# Patient Record
Sex: Male | Born: 1950 | Race: White | Hispanic: No | Marital: Married | Smoking: Former smoker
Health system: Southern US, Community
[De-identification: ages and names within clinical notes are randomized; demographics above are authoritative.]

## PROBLEM LIST (undated history)

## (undated) DIAGNOSIS — F418 Other specified anxiety disorders: Secondary | ICD-10-CM

## (undated) DIAGNOSIS — I480 Paroxysmal atrial fibrillation: Secondary | ICD-10-CM

## (undated) DIAGNOSIS — G4730 Sleep apnea, unspecified: Secondary | ICD-10-CM

## (undated) DIAGNOSIS — F419 Anxiety disorder, unspecified: Secondary | ICD-10-CM

## (undated) DIAGNOSIS — M1990 Unspecified osteoarthritis, unspecified site: Secondary | ICD-10-CM

## (undated) DIAGNOSIS — K219 Gastro-esophageal reflux disease without esophagitis: Secondary | ICD-10-CM

## (undated) DIAGNOSIS — M5090 Cervical disc disorder, unspecified, unspecified cervical region: Secondary | ICD-10-CM

## (undated) DIAGNOSIS — I10 Essential (primary) hypertension: Secondary | ICD-10-CM

## (undated) DIAGNOSIS — J449 Chronic obstructive pulmonary disease, unspecified: Secondary | ICD-10-CM

## (undated) HISTORY — PX: WRIST SURGERY: SHX841

## (undated) HISTORY — DX: Cervical disc disorder, unspecified, unspecified cervical region: M50.90

## (undated) HISTORY — PX: TOTAL SHOULDER ARTHROPLASTY: SHX126

## (undated) HISTORY — DX: Other specified anxiety disorders: F41.8

## (undated) HISTORY — DX: Essential (primary) hypertension: I10

## (undated) HISTORY — PX: APPENDECTOMY: SHX54

## (undated) HISTORY — PX: CHOLECYSTECTOMY: SHX55

## (undated) HISTORY — DX: Unspecified osteoarthritis, unspecified site: M19.90

## (undated) HISTORY — DX: Gastro-esophageal reflux disease without esophagitis: K21.9

## (undated) HISTORY — DX: Anxiety disorder, unspecified: F41.9

## (undated) HISTORY — DX: Chronic obstructive pulmonary disease, unspecified: J44.9

## (undated) HISTORY — DX: Paroxysmal atrial fibrillation: I48.0

## (undated) HISTORY — PX: OTHER SURGICAL HISTORY: SHX169

---

## 1998-11-18 ENCOUNTER — Ambulatory Visit (HOSPITAL_COMMUNITY): Admission: RE | Admit: 1998-11-18 | Discharge: 1998-11-18 | Payer: Self-pay | Admitting: Internal Medicine

## 1998-11-18 ENCOUNTER — Encounter: Payer: Self-pay | Admitting: Internal Medicine

## 1998-12-02 ENCOUNTER — Ambulatory Visit (HOSPITAL_COMMUNITY): Admission: RE | Admit: 1998-12-02 | Discharge: 1998-12-02 | Payer: Self-pay | Admitting: Internal Medicine

## 1998-12-31 ENCOUNTER — Encounter: Payer: Self-pay | Admitting: Internal Medicine

## 1998-12-31 ENCOUNTER — Ambulatory Visit (HOSPITAL_COMMUNITY): Admission: RE | Admit: 1998-12-31 | Discharge: 1998-12-31 | Payer: Self-pay | Admitting: Internal Medicine

## 1999-01-14 ENCOUNTER — Encounter: Payer: Self-pay | Admitting: Internal Medicine

## 1999-01-14 ENCOUNTER — Ambulatory Visit (HOSPITAL_COMMUNITY): Admission: RE | Admit: 1999-01-14 | Discharge: 1999-01-14 | Payer: Self-pay | Admitting: Internal Medicine

## 1999-03-10 ENCOUNTER — Ambulatory Visit (HOSPITAL_COMMUNITY): Admission: RE | Admit: 1999-03-10 | Discharge: 1999-03-10 | Payer: Self-pay | Admitting: Neurosurgery

## 1999-03-10 ENCOUNTER — Encounter: Payer: Self-pay | Admitting: Neurosurgery

## 2000-08-06 ENCOUNTER — Ambulatory Visit (HOSPITAL_COMMUNITY): Admission: RE | Admit: 2000-08-06 | Discharge: 2000-08-06 | Payer: Self-pay | Admitting: Internal Medicine

## 2000-08-07 ENCOUNTER — Encounter: Payer: Self-pay | Admitting: Internal Medicine

## 2000-09-18 ENCOUNTER — Ambulatory Visit (HOSPITAL_COMMUNITY): Admission: RE | Admit: 2000-09-18 | Discharge: 2000-09-18 | Payer: Self-pay | Admitting: Internal Medicine

## 2000-09-18 ENCOUNTER — Encounter: Payer: Self-pay | Admitting: Internal Medicine

## 2001-01-03 ENCOUNTER — Encounter: Payer: Self-pay | Admitting: Internal Medicine

## 2001-01-03 ENCOUNTER — Ambulatory Visit (HOSPITAL_COMMUNITY): Admission: RE | Admit: 2001-01-03 | Discharge: 2001-01-03 | Payer: Self-pay | Admitting: Internal Medicine

## 2001-02-18 ENCOUNTER — Encounter: Payer: Self-pay | Admitting: Neurosurgery

## 2001-02-18 ENCOUNTER — Encounter: Admission: RE | Admit: 2001-02-18 | Discharge: 2001-02-18 | Payer: Self-pay | Admitting: Neurosurgery

## 2002-03-18 ENCOUNTER — Encounter: Payer: Self-pay | Admitting: Internal Medicine

## 2002-03-18 ENCOUNTER — Ambulatory Visit (HOSPITAL_COMMUNITY): Admission: RE | Admit: 2002-03-18 | Discharge: 2002-03-18 | Payer: Self-pay | Admitting: Internal Medicine

## 2002-08-04 ENCOUNTER — Encounter: Payer: Self-pay | Admitting: Internal Medicine

## 2002-08-04 ENCOUNTER — Ambulatory Visit (HOSPITAL_COMMUNITY): Admission: RE | Admit: 2002-08-04 | Discharge: 2002-08-04 | Payer: Self-pay | Admitting: Internal Medicine

## 2003-10-07 ENCOUNTER — Ambulatory Visit (HOSPITAL_COMMUNITY): Admission: RE | Admit: 2003-10-07 | Discharge: 2003-10-07 | Payer: Self-pay | Admitting: Internal Medicine

## 2003-10-07 HISTORY — PX: COLONOSCOPY: SHX174

## 2004-05-27 ENCOUNTER — Emergency Department (HOSPITAL_COMMUNITY): Admission: EM | Admit: 2004-05-27 | Discharge: 2004-05-28 | Payer: Self-pay | Admitting: *Deleted

## 2004-06-02 ENCOUNTER — Ambulatory Visit (HOSPITAL_COMMUNITY): Admission: RE | Admit: 2004-06-02 | Discharge: 2004-06-02 | Payer: Self-pay | Admitting: Internal Medicine

## 2004-10-18 ENCOUNTER — Encounter (HOSPITAL_COMMUNITY): Admission: RE | Admit: 2004-10-18 | Discharge: 2004-10-21 | Payer: Self-pay | Admitting: Orthopaedic Surgery

## 2004-10-24 ENCOUNTER — Encounter (HOSPITAL_COMMUNITY): Admission: RE | Admit: 2004-10-24 | Discharge: 2004-11-23 | Payer: Self-pay | Admitting: Orthopaedic Surgery

## 2004-11-25 ENCOUNTER — Encounter (HOSPITAL_COMMUNITY): Admission: RE | Admit: 2004-11-25 | Discharge: 2004-12-25 | Payer: Self-pay | Admitting: Orthopaedic Surgery

## 2004-12-27 ENCOUNTER — Encounter (HOSPITAL_COMMUNITY): Admission: RE | Admit: 2004-12-27 | Discharge: 2005-01-03 | Payer: Self-pay | Admitting: Orthopaedic Surgery

## 2005-08-25 ENCOUNTER — Ambulatory Visit (HOSPITAL_COMMUNITY): Admission: RE | Admit: 2005-08-25 | Discharge: 2005-08-25 | Payer: Self-pay | Admitting: Pulmonary Disease

## 2006-04-03 ENCOUNTER — Ambulatory Visit (HOSPITAL_COMMUNITY): Admission: RE | Admit: 2006-04-03 | Discharge: 2006-04-03 | Payer: Self-pay | Admitting: Internal Medicine

## 2007-02-19 ENCOUNTER — Inpatient Hospital Stay (HOSPITAL_COMMUNITY): Admission: RE | Admit: 2007-02-19 | Discharge: 2007-02-21 | Payer: Self-pay | Admitting: Orthopaedic Surgery

## 2007-03-05 ENCOUNTER — Encounter (HOSPITAL_COMMUNITY): Admission: RE | Admit: 2007-03-05 | Discharge: 2007-04-04 | Payer: Self-pay | Admitting: Orthopaedic Surgery

## 2007-04-08 ENCOUNTER — Encounter (HOSPITAL_COMMUNITY): Admission: RE | Admit: 2007-04-08 | Discharge: 2007-05-08 | Payer: Self-pay | Admitting: Orthopaedic Surgery

## 2007-07-11 ENCOUNTER — Inpatient Hospital Stay (HOSPITAL_COMMUNITY): Admission: EM | Admit: 2007-07-11 | Discharge: 2007-07-15 | Payer: Self-pay | Admitting: Emergency Medicine

## 2007-07-11 ENCOUNTER — Encounter (INDEPENDENT_AMBULATORY_CARE_PROVIDER_SITE_OTHER): Payer: Self-pay | Admitting: Internal Medicine

## 2007-07-11 ENCOUNTER — Ambulatory Visit: Payer: Self-pay | Admitting: Internal Medicine

## 2007-07-12 ENCOUNTER — Encounter (INDEPENDENT_AMBULATORY_CARE_PROVIDER_SITE_OTHER): Payer: Self-pay | Admitting: Internal Medicine

## 2007-07-17 ENCOUNTER — Ambulatory Visit: Payer: Self-pay | Admitting: Cardiology

## 2007-07-19 ENCOUNTER — Ambulatory Visit: Payer: Self-pay | Admitting: Cardiology

## 2007-07-24 ENCOUNTER — Ambulatory Visit: Payer: Self-pay | Admitting: Cardiovascular Disease

## 2007-07-31 ENCOUNTER — Ambulatory Visit: Payer: Self-pay | Admitting: Cardiology

## 2007-07-31 ENCOUNTER — Encounter (HOSPITAL_COMMUNITY): Admission: RE | Admit: 2007-07-31 | Discharge: 2007-08-30 | Payer: Self-pay | Admitting: Cardiovascular Disease

## 2007-08-07 ENCOUNTER — Ambulatory Visit: Payer: Self-pay | Admitting: Cardiology

## 2007-08-14 ENCOUNTER — Ambulatory Visit: Payer: Self-pay | Admitting: Cardiovascular Disease

## 2007-08-14 ENCOUNTER — Ambulatory Visit: Payer: Self-pay | Admitting: Cardiology

## 2007-08-28 ENCOUNTER — Ambulatory Visit: Payer: Self-pay | Admitting: Cardiology

## 2007-09-13 ENCOUNTER — Ambulatory Visit: Payer: Self-pay | Admitting: Cardiology

## 2007-10-07 ENCOUNTER — Ambulatory Visit (HOSPITAL_COMMUNITY): Admission: RE | Admit: 2007-10-07 | Discharge: 2007-10-07 | Payer: Self-pay | Admitting: Internal Medicine

## 2007-12-24 DIAGNOSIS — I4891 Unspecified atrial fibrillation: Secondary | ICD-10-CM

## 2007-12-24 DIAGNOSIS — K219 Gastro-esophageal reflux disease without esophagitis: Secondary | ICD-10-CM | POA: Insufficient documentation

## 2007-12-24 DIAGNOSIS — J449 Chronic obstructive pulmonary disease, unspecified: Secondary | ICD-10-CM

## 2007-12-24 DIAGNOSIS — I10 Essential (primary) hypertension: Secondary | ICD-10-CM

## 2007-12-24 DIAGNOSIS — J4489 Other specified chronic obstructive pulmonary disease: Secondary | ICD-10-CM | POA: Insufficient documentation

## 2008-05-13 ENCOUNTER — Ambulatory Visit: Payer: Self-pay | Admitting: Cardiology

## 2008-05-20 ENCOUNTER — Ambulatory Visit: Payer: Self-pay | Admitting: Cardiology

## 2008-06-10 ENCOUNTER — Ambulatory Visit: Payer: Self-pay | Admitting: Cardiology

## 2008-06-19 ENCOUNTER — Ambulatory Visit: Payer: Self-pay | Admitting: Cardiovascular Disease

## 2008-06-19 ENCOUNTER — Encounter: Payer: Self-pay | Admitting: Physician Assistant

## 2008-06-23 ENCOUNTER — Encounter: Payer: Self-pay | Admitting: Cardiology

## 2008-06-23 ENCOUNTER — Ambulatory Visit (HOSPITAL_COMMUNITY): Admission: RE | Admit: 2008-06-23 | Discharge: 2008-06-23 | Payer: Self-pay | Admitting: Cardiology

## 2008-06-25 ENCOUNTER — Ambulatory Visit: Payer: Self-pay | Admitting: Cardiology

## 2008-06-30 ENCOUNTER — Ambulatory Visit: Payer: Self-pay | Admitting: Cardiology

## 2008-07-08 ENCOUNTER — Encounter (INDEPENDENT_AMBULATORY_CARE_PROVIDER_SITE_OTHER): Payer: Self-pay | Admitting: *Deleted

## 2008-07-13 ENCOUNTER — Ambulatory Visit: Payer: Self-pay | Admitting: Cardiology

## 2008-07-17 ENCOUNTER — Telehealth (INDEPENDENT_AMBULATORY_CARE_PROVIDER_SITE_OTHER): Payer: Self-pay | Admitting: *Deleted

## 2008-07-23 ENCOUNTER — Ambulatory Visit: Payer: Self-pay | Admitting: Cardiology

## 2008-07-23 ENCOUNTER — Inpatient Hospital Stay (HOSPITAL_COMMUNITY): Admission: EM | Admit: 2008-07-23 | Discharge: 2008-07-23 | Payer: Self-pay | Admitting: Emergency Medicine

## 2008-07-30 ENCOUNTER — Ambulatory Visit: Payer: Self-pay | Admitting: Cardiology

## 2008-08-07 ENCOUNTER — Encounter (INDEPENDENT_AMBULATORY_CARE_PROVIDER_SITE_OTHER): Payer: Self-pay | Admitting: *Deleted

## 2008-10-14 ENCOUNTER — Ambulatory Visit: Payer: Self-pay | Admitting: Cardiology

## 2008-10-15 ENCOUNTER — Encounter: Payer: Self-pay | Admitting: Cardiology

## 2008-11-03 ENCOUNTER — Encounter: Payer: Self-pay | Admitting: Cardiology

## 2009-02-17 ENCOUNTER — Ambulatory Visit (HOSPITAL_COMMUNITY): Admission: RE | Admit: 2009-02-17 | Discharge: 2009-02-17 | Payer: Self-pay | Admitting: Internal Medicine

## 2009-05-04 ENCOUNTER — Emergency Department (HOSPITAL_COMMUNITY): Admission: EM | Admit: 2009-05-04 | Discharge: 2009-05-04 | Payer: Self-pay | Admitting: Emergency Medicine

## 2009-06-08 ENCOUNTER — Encounter (INDEPENDENT_AMBULATORY_CARE_PROVIDER_SITE_OTHER): Payer: Self-pay | Admitting: *Deleted

## 2009-12-24 ENCOUNTER — Ambulatory Visit (HOSPITAL_COMMUNITY): Admission: RE | Admit: 2009-12-24 | Discharge: 2009-12-24 | Payer: Self-pay | Admitting: Internal Medicine

## 2010-02-13 ENCOUNTER — Encounter: Payer: Self-pay | Admitting: Preventative Medicine

## 2010-02-20 LAB — CONVERTED CEMR LAB
BUN: 15 mg/dL
Bilirubin, Direct: 0.8 mg/dL
CO2: 25 meq/L
Chloride: 104 meq/L
Glucose, Bld: 93 mg/dL
Platelets: 269 10*3/uL
Sodium: 145 meq/L
Total Protein: 7.7 g/dL

## 2010-02-22 NOTE — Letter (Signed)
Summary: Appointment - Reminder 2  [facility] [place]. [address] [place] [address]   Phone: [phone]  Fax: [phone]     Jun 08, 2009 MRN: [id number]   Travis Booker [address] [place], [place]  [address]   Dear [name],  Our records indicate that it is time to schedule a follow-up appointment.  Dr.  Daleen Squibb        recommended that you follow up with [place] in    03/2009        . It is very important that we reach you to schedule this appointment. We look forward to participating in your health care needs. Please contact us at the number listed above at your earliest convenience to schedule your appointment.  If you are unable to make an appointment at this time, give [place] a call so we can update our records.     Sincerely,   Glass blower/designer

## 2010-04-13 LAB — URINALYSIS, ROUTINE W REFLEX MICROSCOPIC
Nitrite: NEGATIVE
Specific Gravity, Urine: >1.03 — ABNORMAL HIGH (ref 1.005–1.030)
Urobilinogen, UA: 0.2 mg/dL (ref 0.0–1.0)
pH: 5 (ref 5.0–8.0)

## 2010-04-13 LAB — URINE MICROSCOPIC-ADD ON

## 2010-04-13 LAB — CBC
HCT: 43.3 % (ref 39.0–52.0)
Hemoglobin: 14.8 g/dL (ref 13.0–17.0)
MCHC: 34.3 g/dL (ref 30.0–36.0)
RDW: 12.9 % (ref 11.5–15.5)

## 2010-04-13 LAB — DIFFERENTIAL
Basophils Absolute: 0 10*3/uL (ref 0.0–0.1)
Basophils Relative: 0 % (ref 0–1)
Eosinophils Absolute: 0.2 10*3/uL (ref 0.0–0.7)
Eosinophils Relative: 2 % (ref 0–5)
Monocytes Absolute: 0.6 10*3/uL (ref 0.1–1.0)

## 2010-04-13 LAB — BASIC METABOLIC PANEL
Calcium: 9.4 mg/dL (ref 8.4–10.5)
Creatinine, Ser: 1.01 mg/dL (ref 0.4–1.5)
GFR calc non Af Amer: >60 mL/min (ref >60)

## 2010-05-01 LAB — DIFFERENTIAL
Basophils Absolute: 0 10*3/uL (ref 0.0–0.1)
Basophils Relative: 1 % (ref 0–1)
Lymphocytes Relative: 37 % (ref 12–46)
Monocytes Absolute: 0.5 10*3/uL (ref 0.1–1.0)
Monocytes Absolute: 0.9 10*3/uL (ref 0.1–1.0)
Monocytes Relative: 6 % (ref 3–12)
Monocytes Relative: 6 % (ref 3–12)
Neutro Abs: 4.6 10*3/uL (ref 1.7–7.7)
Neutro Abs: 7.6 10*3/uL (ref 1.7–7.7)

## 2010-05-01 LAB — BASIC METABOLIC PANEL
CO2: 30 mEq/L (ref 19–32)
Calcium: 9.1 mg/dL (ref 8.4–10.5)
Chloride: 99 mEq/L (ref 96–112)
GFR calc Af Amer: 59 mL/min — ABNORMAL LOW (ref >60)
GFR calc Af Amer: >60 mL/min (ref >60)
GFR calc non Af Amer: >60 mL/min (ref >60)
Potassium: 2.8 mEq/L — ABNORMAL LOW (ref 3.5–5.1)
Potassium: 3.4 mEq/L — ABNORMAL LOW (ref 3.5–5.1)
Sodium: 137 mEq/L (ref 135–145)
Sodium: 141 mEq/L (ref 135–145)

## 2010-05-01 LAB — URINALYSIS, ROUTINE W REFLEX MICROSCOPIC
Bilirubin Urine: NEGATIVE
Nitrite: NEGATIVE
Protein, ur: NEGATIVE mg/dL
Specific Gravity, Urine: 1.02 (ref 1.005–1.030)
Urobilinogen, UA: 0.2 mg/dL (ref 0.0–1.0)

## 2010-05-01 LAB — PROTIME-INR: INR: 1.1 (ref 0.00–1.49)

## 2010-05-01 LAB — CBC
HCT: 34.6 % — ABNORMAL LOW (ref 39.0–52.0)
Hemoglobin: 12.3 g/dL — ABNORMAL LOW (ref 13.0–17.0)
Hemoglobin: 14 g/dL (ref 13.0–17.0)
MCHC: 35.8 g/dL (ref 30.0–36.0)
RBC: 4.3 MIL/uL (ref 4.22–5.81)
WBC: 8.6 10*3/uL (ref 4.0–10.5)

## 2010-05-01 LAB — TYPE AND SCREEN: Antibody Screen: NEGATIVE

## 2010-05-01 LAB — RAPID URINE DRUG SCREEN, HOSP PERFORMED: Benzodiazepines: POSITIVE — AB

## 2010-05-01 LAB — APTT: aPTT: 24 seconds (ref 24–37)

## 2010-05-01 LAB — ETHANOL: Alcohol, Ethyl (B): 16 mg/dL — ABNORMAL HIGH (ref 0–10)

## 2010-05-01 LAB — D-DIMER, QUANTITATIVE: D-Dimer, Quant: 0.22 ug/mL-FEU (ref 0.00–0.48)

## 2010-05-02 ENCOUNTER — Other Ambulatory Visit: Payer: Self-pay | Admitting: Neurosurgery

## 2010-05-02 DIAGNOSIS — M542 Cervicalgia: Secondary | ICD-10-CM

## 2010-05-05 ENCOUNTER — Ambulatory Visit
Admission: RE | Admit: 2010-05-05 | Discharge: 2010-05-05 | Disposition: A | Discharge status: Home / Self Care | Payer: BC Managed Care – HMO | Source: Ambulatory Visit | Attending: Neurosurgery | Admitting: Neurosurgery

## 2010-05-05 ENCOUNTER — Other Ambulatory Visit: Payer: Self-pay | Admitting: Neurosurgery

## 2010-05-05 DIAGNOSIS — M542 Cervicalgia: Secondary | ICD-10-CM

## 2010-05-17 ENCOUNTER — Encounter: Payer: Self-pay | Admitting: Physician Assistant

## 2010-05-17 ENCOUNTER — Ambulatory Visit (INDEPENDENT_AMBULATORY_CARE_PROVIDER_SITE_OTHER): Payer: BC Managed Care – PPO | Admitting: Physician Assistant

## 2010-05-17 VITALS — BP 148/80 | Resp 18 | Ht 72.0 in | Wt 218.8 lb

## 2010-05-17 DIAGNOSIS — I10 Essential (primary) hypertension: Secondary | ICD-10-CM

## 2010-05-17 DIAGNOSIS — I4891 Unspecified atrial fibrillation: Secondary | ICD-10-CM

## 2010-05-17 DIAGNOSIS — F17200 Nicotine dependence, unspecified, uncomplicated: Secondary | ICD-10-CM

## 2010-05-17 DIAGNOSIS — R079 Chest pain, unspecified: Secondary | ICD-10-CM | POA: Insufficient documentation

## 2010-05-17 DIAGNOSIS — Z01810 Encounter for preprocedural cardiovascular examination: Secondary | ICD-10-CM | POA: Insufficient documentation

## 2010-05-17 DIAGNOSIS — Z720 Tobacco use: Secondary | ICD-10-CM | POA: Insufficient documentation

## 2010-05-17 NOTE — Patient Instructions (Signed)
Your physician recommends that you schedule a follow-up appointment in: 6 MONTHS WITH DR. WALL IN THE [place] OFFICE AS PER PT.  Your physician has requested that you have en exercise stress myoview 786.50, V72.81 PT WOULD LIKE TO HAVE THIS DONE IN [place] IF POSSIBLE. For further information please visit [email or web address]. Please follow instruction sheet, as given.  Your physician recommends that you continue on your current medications as directed. Please refer to the Current Medication list given to you today.

## 2010-05-17 NOTE — Assessment & Plan Note (Signed)
I discussed the importance of smoking cessation with him today.  He is interested in quitting but does not seem completely motivated just yet.

## 2010-05-17 NOTE — Assessment & Plan Note (Signed)
Proceed with Myoview as noted.

## 2010-05-17 NOTE — Progress Notes (Signed)
History of Present Illness: Primary Cardiologist:  Dr. Valera Castle  Travis Booker is a 60 y.o. male with a h/o paroxysmal atrial fibrillation, maintained on flecainide, HTN, GERD, COPD with ongoing tobacco abuse who presents for surgical clearance.  Last echo 6/09: EF normal with mild LVH.  Last Myoview 7/09: EF 59%, diaphragmatic attenuation but no ischemia.  He has cervical disc disease with nerve impingement and resultant bilateral arm pain.  He just saw his PCP for URI symptoms.  He was placed on Ceftin.  He does not feel good and thinks he may postpone.  His job is somewhat laborious.  He denies exertional chest pain.  However, he does have chest pressure at times on the left.  There is no radiation. He denies associated nausea, diaphoresis, dyspnea or radiation.  He does note DOE.  He describes NYHA class II symptoms.  He denies orthopnea or PND.  He denies syncope.  He denies palpitations.  Past Medical History  Diagnosis Date  . Paroxysmal atrial fibrillation     a. flecainide therapy;  b. echo 6/09: EF normal, mild LVH;   c. Myoview 7/09: EF 59%, no ischemia  . HTN (hypertension)   . GERD (gastroesophageal reflux disease)   . COPD (chronic obstructive pulmonary disease)   . Depression with anxiety   . DJD (degenerative joint disease)   . Cervical disc disease     surgery planned for 05/25/10 (Dr. Gerlene Fee)    Current Outpatient Prescriptions  Medication Sig Dispense Refill  . ALPRAZolam (XANAX) 1 MG tablet Take 1 mg by mouth at bedtime as needed.        Marland Kitchen aspirin 325 MG tablet Take 325 mg by mouth daily.        . cefdinir (OMNICEF) 300 MG capsule Take 300 mg by mouth 2 (two) times daily.        Marland Kitchen diltiazem (CARDIZEM CD) 240 MG 24 hr capsule Take 240 mg by mouth daily.        Marland Kitchen escitalopram (LEXAPRO) 10 MG tablet Take 10 mg by mouth daily.        Marland Kitchen esomeprazole (NEXIUM) 40 MG capsule Take 40 mg by mouth daily before breakfast.        . flecainide (TAMBOCOR) 100 MG tablet Take  75 mg by mouth 2 (two) times daily.       Marland Kitchen gabapentin (NEURONTIN) 300 MG capsule Take 300 mg by mouth 3 (three) times daily.        Marland Kitchen HYDROcodone-acetaminophen (VICODIN ES) 7.5-750 MG per tablet Take 1 tablet by mouth every 6 (six) hours as needed.        Marland Kitchen olmesartan-hydrochlorothiazide (BENICAR HCT) 40-25 MG per tablet Take 1 tablet by mouth daily.        Marland Kitchen oxyCODONE-acetaminophen (PERCOCET) 5-325 MG per tablet Take 1 tablet by mouth every 4 (four) hours as needed.          No Known Allergies   History  Substance Use Topics  . Smoking status: Current Everyday Smoker -- 1.0 packs/day    Types: Cigarettes  . Smokeless tobacco: Never Used   Comment: 1 pack a day  . Alcohol Use: 1.0 oz/week    2 drink(s) per week    Family History  Problem Relation Age of Onset  . Cancer Mother   . Dementia Mother   . Heart attack Brother 45    ROS:  See HPI.  Denies cough, melena, hematochezia, weight changes, dysuria.  All other systems reviewed and  negative.  Vital Signs: BP 148/80  Resp 18  Ht 6' (1.829 m)  Wt 218 lb 12.8 oz (99.247 kg)  BMI 29.67 kg/m2  PHYSICAL EXAM: Well nourished, well developed, in no acute distress HEENT: normal Neck: no JVD Vascular: no carotid bruits Endocrine: no thyromegaly Lymph: no cervical adenopathy Cardiac:  normal S1, S2; RRR; no murmur Lungs:  clear to auscultation bilaterally, diffuse expiratory wheezes bilaterally, no rales Abd: soft, nontender, no hepatomegaly, no bruits or pulsatile masses Ext: no edema Skin: warm and dry Neuro:  CNs 2-12 intact, no focal abnormalities noted Psych: normal affect  EKG:  Sinus bradycardia, HR 54, normal axis, no ischemic changes  ASSESSMENT AND PLAN:

## 2010-05-17 NOTE — Assessment & Plan Note (Signed)
He reports some chest pain.  It is atypical, but he has risk factors of HTN and ongoing tobacco abuse.  He also notes DOE.  Before clearing him for surgery, I have recommended he proceed with stress testing.  He would like to have this done in [place].  He thinks he can walk a treadmill and we will schedule a GXT Myoview.  He may need to be switched to Lexiscan if he cannot achieve his heart rate.  If his Myoview is low risk, he can be cleared for his surgery.

## 2010-05-17 NOTE — Assessment & Plan Note (Signed)
Elevated today.  He notes it usually is better.  Monitor.

## 2010-05-17 NOTE — Assessment & Plan Note (Signed)
Maintaining NSR.  CHADS2=1.  Continue ASA and flecainide.

## 2010-05-23 ENCOUNTER — Ambulatory Visit (HOSPITAL_COMMUNITY): Payer: BC Managed Care – PPO | Attending: Cardiology | Admitting: Radiology

## 2010-05-23 DIAGNOSIS — R0789 Other chest pain: Secondary | ICD-10-CM

## 2010-05-23 DIAGNOSIS — R0989 Other specified symptoms and signs involving the circulatory and respiratory systems: Secondary | ICD-10-CM

## 2010-05-23 DIAGNOSIS — R0609 Other forms of dyspnea: Secondary | ICD-10-CM

## 2010-05-23 DIAGNOSIS — Z01810 Encounter for preprocedural cardiovascular examination: Secondary | ICD-10-CM

## 2010-05-23 DIAGNOSIS — I4949 Other premature depolarization: Secondary | ICD-10-CM

## 2010-05-23 DIAGNOSIS — R079 Chest pain, unspecified: Secondary | ICD-10-CM

## 2010-05-23 MED ORDER — TECHNETIUM TC 99M TETROFOSMIN IV KIT
33.0000 | PACK | Freq: Once | INTRAVENOUS | Status: AC | PRN
  Administered 2010-05-23: 33 via INTRAVENOUS

## 2010-05-23 MED ORDER — TECHNETIUM TC 99M TETROFOSMIN IV KIT
11.0000 | PACK | Freq: Once | INTRAVENOUS | Status: AC | PRN
  Administered 2010-05-23: 11 via INTRAVENOUS

## 2010-05-23 NOTE — Progress Notes (Signed)
[facility] NUCLEAR MED [address] [place] [address] [phone]  Cardiology Nuclear Med Study  Travis Booker is a 60 y.o. male [id number] 1950-06-22   Nuclear Med Background Indication for Stress Test:  Evaluation for Ischemia and Surgical Clearance for neck surgery by Dr. Gerlene Fee on 06/01/10 History:  6/09- Normal Echo;7/09- Normal MPS;H/O PAF Cardiac Risk Factors: Family History - CAD, Hypertension and Smoker  Symptoms: Chest pressure,tightness;Diaphoresis;Dizziness;DOE;Lightheadness;Fatigue;Palpitations   Nuclear Pre-Procedure Caffeine/Decaff Intake:  11:00pm NPO After: 8:00pm   Lungs:  Clear IV 0.9% NS with Angio Cath:  20g  IV Site: R Antecubital  IV Started by:  Irean Hong, RN  Chest Size (in):  42 Cup Size: n/a  Height: 6' (1.829 m)  Weight:  216 lb (97.977 kg)  BMI:  Body mass index is 29.29 kg/(m^2). Tech Comments: Patient only achieved 84% of max target HR. Patient stopped due to HTN,legs fatigue and severe SOB.    Nuclear Med Study 1 or 2 day study: 1 day  Stress Test Type:  Stress  Reading MD: Kristeen Miss, MD  Order Authorizing Provider:  Annice Pih  Resting Radionuclide: Technetium 30m Tetrofosmin  Resting Radionuclide Dose: 11 mCi   Stress Radionuclide:  Technetium 76m Tetrofosmin  Stress Radionuclide Dose: 33 mCi           Stress Protocol Rest HR: 50 Stress HR: 136  Rest BP: 128/77 Stress BP: 192/65  Exercise Time (min): 11:03 METS: 11.8   Predicted Max HR: 162 bpm % Max HR: 83.95 bpm Rate Pressure Product: 21308   Dose of Adenosine (mg):  n/a Dose of Lexiscan: n/a mg  Dose of Atropine (mg): n/a Dose of Dobutamine: n/a mcg/kg/min (at max HR)  Stress Test Technologist: Cathlyn Parsons, RN  Nuclear Technologist:  Domenic Polite, CNMT     Rest Procedure:  Myocardial perfusion imaging was performed at rest 45 minutes following the intravenous administration of Technetium 40m Tetrofosmin. Rest ECG: Sinus  Bradycardia  Stress Procedure:  The patient exercised for 11:03 minutes.  The patient stopped due to fatigue,severe SOB and HTN and denied any chest pain. Patient had occasional PVC's and PAC's.  There were no significant ST-T wave changes.  Technetium 3m Tetrofosmin was injected at peak exercise and myocardial perfusion imaging was performed after a brief delay. Stress ECG: No significant change from baseline ECG.  There were occasional PVCs.  QPS Raw Data Images:  Normal; no motion artifact; normal heart/lung ratio. Stress Images:  Normal homogeneous uptake in all areas of the myocardium. Rest Images:  Normal homogeneous uptake in all areas of the myocardium. Subtraction (SDS):  No evidence of ischemia. Transient Ischemic Dilatation (Normal <1.22):  1.05 Lung/Heart Ratio (Normal <0.45):  .35   Quantitative Gated Spect Images QGS EDV:  143 ml QGS ESV:  59 ml QGS cine images:  NL LV Function; NL Wall Motion QGS EF: 59%  Impression Exercise Capacity:  Excellent exercise capacity. BP Response:  Normal blood pressure response. Clinical Symptoms:  No chest pain. ECG Impression:  No significant ST segment change suggestive of ischemia. Comparison with Prior Nuclear Study: No images to compare  Overall Impression:  Normal stress nuclear study.  No evidence of ischemia.  Normal LV function.    [name].

## 2010-05-24 NOTE — Progress Notes (Signed)
ROUTED TO DR.WALL.Falecha Clark ° ° °

## 2010-05-26 ENCOUNTER — Telehealth: Payer: Self-pay | Admitting: Physician Assistant

## 2010-05-26 NOTE — Telephone Encounter (Signed)
Please notify patient stress test is normal. No further testing needed before surgery and he is at acceptable risk. Please send a copy of stress test to his surgeon.

## 2010-05-31 ENCOUNTER — Telehealth: Payer: Self-pay | Admitting: *Deleted

## 2010-05-31 ENCOUNTER — Ambulatory Visit (HOSPITAL_COMMUNITY): Admission: RE | Admit: 2010-05-31 | Payer: BC Managed Care – PPO | Source: Ambulatory Visit | Admitting: Neurosurgery

## 2010-05-31 NOTE — Telephone Encounter (Signed)
Pt is aware of stress test results and copy sent to Dr. Gerlene Fee. Mylo Red RN

## 2010-06-03 ENCOUNTER — Ambulatory Visit (HOSPITAL_COMMUNITY)
Admission: RE | Admit: 2010-06-03 | Discharge: 2010-06-03 | Disposition: A | Discharge status: Home / Self Care | Payer: BC Managed Care – PPO | Source: Ambulatory Visit | Attending: Internal Medicine | Admitting: Internal Medicine

## 2010-06-03 ENCOUNTER — Other Ambulatory Visit (HOSPITAL_COMMUNITY): Payer: Self-pay | Admitting: Internal Medicine

## 2010-06-03 DIAGNOSIS — R059 Cough, unspecified: Secondary | ICD-10-CM | POA: Insufficient documentation

## 2010-06-03 DIAGNOSIS — R05 Cough: Secondary | ICD-10-CM

## 2010-06-03 DIAGNOSIS — Z01818 Encounter for other preprocedural examination: Secondary | ICD-10-CM | POA: Insufficient documentation

## 2010-06-07 NOTE — Assessment & Plan Note (Signed)
Travis Booker                       Travis Booker CARDIOLOGY OFFICE NOTE   Travis Booker, Travis Booker                 MRN:          [id number]  DATE:05/13/2008                            DOB:          1950/07/30    HISTORY OF PRESENT ILLNESS:  Travis Booker comes in today with recurrent  atrial fibrillation.   Travis Booker developed a respiratory tract infection about 3 weeks ago and was  using some inhalers.  Travis Booker also continues to smoke.   Travis Booker atrial fib was originally diagnosed in June 2009.  At that time, Travis Booker  had a flare of Travis Booker hypertension.  Once Travis Booker blood pressure was  controlled, Travis Booker went back into normal sinus.   Of note, Travis Booker had a normal stress Myoview with excellent exercise  tolerance, EF of 59%.  There is no ischemia.  Travis Booker had no dysrhythmias.  Travis Booker also had a 2-D echocardiogram, which was completely normal with an EF  of 60-65% with no mention of left atrial enlargement.   Travis Booker [place] score is fairly low.   Travis Booker had an extensive nosebleed the other day.  Travis Booker is really concerned  about blood thinners.  Travis Booker said, it scared him to death.   MEDICATIONS:  Travis Booker is currently on  1. Lexapro 10 mg per day.  2. Diltiazem extended-release 240 mg a day.  3. Nexium 40 mg a day.  4. Fish oil 1000 mg per day.  5. Aspirin 325 mg a day.  6. Benicar 20 mg per day.   ALLERGIES:  Travis Booker had a cough to lisinopril, which Dr. Ouida Booker has  discontinued.   PHYSICAL EXAMINATION:  VITAL SIGNS:  Travis Booker blood pressure today is 140/80,  Travis Booker pulse 68 and regular.  Travis Booker weight is 214.  SKIN:  Pale and dry.  HEENT:  Sclerae injected.  Otherwise negative.  NECK:  Supple.  Carotids upstrokes were equal bilaterally without  bruits.  No JVD.  Thyroid is not enlarged.  Trachea is midline.  LUNGS:  Reveal inspiratory and expiratory rhonchi.  Travis Booker is coughing throughout  the clinic visit.  HEART:  Irregular rate and rhythm.  No gallop, murmur, or rub.  ABDOMEN:  Soft and good bowel sounds.  No tenderness.   No pulsatile  mass.  EXTREMITIES:  No cyanosis, clubbing, or edema.  Pulses were present.  NEUROLOGIC:  Intact.   Electrocardiogram from Dr. Alonza Booker office and today confirms atrial fib  with a well-controlled ventricular rate.  Travis Booker previous ECG showed sinus  bradycardia here in the office with a normal PR, QRS and QTc interval.   Travis Booker is extremely fatigued and just does not have the get up and go.  In  fact Travis Booker wants me to write him excuse to stay out of work from [employer]  because of Travis Booker inability to get up and go.   I had a long talk with Travis Booker today.  Before we try cardioversion,  we are going to put him on flecainide 100 mg p.o. b.i.d..  Travis Booker will  remain on aspirin for the time being with Travis Booker low Italy score not to  mention Travis Booker recent extensive nosebleed.   We will  bring him back in a week for an EKG and a brief office visit.  If Travis Booker is not in sinus rhythm, we will start him on low-dose Coumadin and  hope that Travis Booker does not have recurrent epistaxis.  Travis Booker will then have to  have electrical cardioversion on flecainide once Travis Booker INR has been  therapeutic for 3 weeks.     Thomas C. Daleen Squibb, MD, [facility]  Electronically Signed    TCW/MedQ  DD: 05/13/2008  DT: 05/14/2008  Job #: [id number]   cc:   Travis Booker. Travis Sills, MD

## 2010-06-07 NOTE — Consult Note (Signed)
NAMENATHEN, BALABAN NO.:  [id number]   MEDICAL RECORD NO.:  [id number]          PATIENT TYPE:  INP   LOCATION:  A319                          FACILITY:  [facility]   PHYSICIAN:  Pricilla Riffle, MD, FACCDATE OF BIRTH:  1950-05-20   DATE OF CONSULTATION:  07/12/2007  DATE OF DISCHARGE:                                 CONSULTATION   IDENTIFICATION:  Mr. Sattar is a 60 year old who we was asked to see  regarding atrial fibrillation.   HISTORY OF PRESENT ILLNESS:  The patient presented yesterday to the  emergency room complaining of weakness for greater than 3-4 weeks.  He  also complained of 2-day history of left shoulder and arm pain.  He also  notes an irregular heartbeat.  Symptoms were worse with activity.   Note, he has had occasional episodes of palpitation in the past where  his heart was flip-flopping, it has gone away.  He denies chest pain  except when he moves his arm.  Notes occasional gas sensation that goes  away and not associated with any particular activity.  Yesterday, he was  dizzy, but has no history of syncope.   ALLERGIES:  None.   MEDICATIONS:  On admission include Norvasc, Xanax, Nexium and Percocet.  Here in the hospital, he is on diltiazem 90 q.8 h., Nexium 40, Ambien  10, Xanax p.r.n., Percocet p.r.n.   PAST MEDICAL HISTORY:  Hypertension, DJD, he had arthroplasty of the  left shoulder this past year, GE reflux, and anxiety.   SOCIAL HISTORY:  The patient smokes a pack every 3 days in the past 40  years.  Drinks about 18 beers per week.   FAMILY HISTORY:  Father had peripheral vascular disease and  cerebrovascular disease in his 31s.   REVIEW OF SYSTEMS:  He notes he is always cold.  No PND, fatigue, and  shortness of breath as noted above, otherwise all systems negative to  the above problem.   PHYSICAL EXAM:  GENERAL:  The patient is in no acute distress, so he  says he just feels tired.  No shortness of breath at rest, no chest  pain, he does have left arm shoulder pain.  VITAL SIGNS:  Temperature is 97.7, blood pressure is 116-156/70-82,  pulses in the 50s to 60s at present in atrial fibrillation.  NECK:  JVP is normal.  No bruits.  HEENT:  Normocephalic, atraumatic.  EOMI.  PERRL.  Mucous membranes are  moist.  LUNGS:  Clear without rales or wheezes.  CARDIAC:  Irregularly irregular, S1 S2, no S3, no significant murmurs.  PMI is not displaced.  ABDOMEN:  Mild right upper quadrant tenderness and no hepatomegaly.  Normal bowel sounds.  No masses.  EXTREMITIES:  Good distal pulses throughout.  No lower extremity edema.   A 12-lead EKG, atrial fibrillation with ventricular rate of 65 beats per  minute.   LABS:  Significant for hemoglobin of 15, WBC of 8.6, BUN and creatinine  of 12 and 0.9, potassium of 3.5.  Point-of-care markers initially,  troponin 0.14, but the second point-of-care marker was less than 0.05.  CK-MB of 49/0.9, troponin  less than 0.01, D-dimer 0.35.  Chest x-ray, no  acute disease.   IMPRESSION:  Mr. Cowgill is a 60 year old who now presents with atrial  fibrillation.  It sounds like it has been like this for at least 3-4  weeks by history with intermittent spells in the past possibly.  Currently, his rates are in the 50s.  He still complains of fatigue,  also complains of left shoulder pain that appears to be musculoskeletal.   RECOMMENDATIONS:  Echocardiogram to define LV function, check TSH,  Lovenox, and Coumadin.  The patient will need at least 4 weeks of  anticoagulation then cardioversion.  I am not convinced that the patient  is symptomatic enough to warrant TEE cardioversion, but if his symptoms  do not improve, he may indeed need to consider this.  No evidence of  congestive heart failure on exam and indeed rates are controlled.  Norvasc has been discontinued.  I would decrease the Cardizem to 60 q.6  h. and need to follow blood pressure may need other agent.  Follow blood   pressure and heart rate may need other agent.   With symptoms of fatigue, dyspnea, echo to define EF.  If down, we will  also need to rule out ischemia, coronary artery disease.  Note, troponin  minimal, increase x1, others negative, will follow.  Hypertension,  follow new meds.  Health care maintenance, check lipids counseled on  tobacco.      Pricilla Riffle, MD, [facility]  Electronically Signed     PVR/MEDQ  D:  07/12/2007  T:  07/12/2007  Job:  [id number]

## 2010-06-07 NOTE — H&P (Signed)
Travis Booker, Travis Booker          ACCOUNT NO.:  [id number]   MEDICAL RECORD NO.:  [id number]          PATIENT TYPE:  INP   LOCATION:  A335                          FACILITY:  [facility]   PHYSICIAN:  Kingsley Callander. Ouida Sills, MD       DATE OF BIRTH:  23-Mar-1950   DATE OF ADMISSION:  07/23/2008  DATE OF DISCHARGE:  07/01/2010LH                              HISTORY & PHYSICAL   CHIEF COMPLAINT:  Passed out.   HISTORY OF PRESENT ILLNESS:  This patient is a 60 year old white male  with a history of paroxysmal atrial fibrillation treated with  flecainide, who presented after passing out.  He got out of his car to  get gas in [place] and passed out and fell.  His wife felt it was  several minutes until he regain consciousness.  He was brought to the  emergency room and evaluated and was hospitalized in the ICU.  Cardiac  monitoring has revealed no sign of dysrhythmia.  He has not had any  symptomatic recurrences of atrial fibrillation.  He has no history of  seizures.  There was no tongue biting or incontinence.  He denied any  chest pain or any stroke-type symptoms.   PAST MEDICAL HISTORY:  1. Paroxysmal atrial fibrillation.  2. Hypertension.  3. Chronic back pain.  4. Left shoulder surgery including the replacement.  5. Left wrist surgeries.  6. Two back surgeries.  7. Anxiety.  8. Gastroesophageal reflux disease.  9. Appendectomy.  10.Cholecystectomy.   MEDICATIONS:  1. Flecainide 75 mg b.i.d.  2. Diltiazem 120 mg daily.  3. Chlorthalidone 12.5 mg daily.  4. Benicar 40 mg daily.  5. Nexium 40 mg daily.  6. Aspirin 325 mg daily.  7. Potassium daily.  8. Fish oil daily.  9. Lexapro 10 mg daily.  10.Alprazolam 1 mg daily.  11.Percocet q.i.d. p.r.n.   ALLERGIES:  None.   SOCIAL HISTORY:  He has history of tobacco and alcohol use.   FAMILY HISTORY:  Sudden death in a brother who also had seizures.  Father had peripheral vascular disease.  Mother had dementia.   REVIEW OF SYSTEMS:  He  has chronic pain in his back and joints.  He  denies angina, difficulty breathing, fever, or change in bowel habits.   PHYSICAL EXAMINATION:  VITAL SIGNS:  Temperature 97.7, blood pressure  102/61, pulse 65, respirations 16, and oxygen saturation 95%.  GENERAL:  Alert and in no distress.  HEENT:  No scleral icterus.  Oropharynx is moist.  NECK:  Supple with no JVD, thyromegaly, or carotid bruit.  LUNGS:  Clear.  HEART:  Regular with no murmurs.  ABDOMEN:  Nontender.  No hepatosplenomegaly.  EXTREMITIES:  Postoperative changes in the left wrist and shoulder.  No  cyanosis, clubbing, or edema.  NEUROLOGIC:  No focal weakness.  LYMPH NODES:  No enlargement.  SKIN:  Normal.   LABORATORY DATA:  His EKG reveals sinus rhythm at 69 beats per minute,  white count 16.1, hemoglobin 14, and platelets 241,000.  Urinalysis  revealed trace ketones.  Sodium 137, potassium 2.8, bicarb 30, BUN 11,  creatinine 1.4, glucose 117.  Troponin-I less than 0.05.  Drug screen  was positive for benzodiazepines and marijuana.  Alcohol level was 16  and lactic acid was 1.9.  A chest x-ray was negative.  A CT was negative  and a CT angio of the chest revealed no PE and did have some thickening  of the distal esophagus.  CT of the abdomen was negative.   IMPRESSION:  1. Syncope.  This appears to likely have been a vasovagal reaction.      Doubt proarrhythmic effect from flecainide resulting in this event.      We will obtain a Cardiology consult with Dr. Daleen Squibb.  His case has      been discussed with Dr. Daleen Squibb.  2. Hypokalemia.  Potassium has been replaced orally and intravenously.  3. Paroxysmal atrial fibrillation.  He is maintaining sinus rhythm.  4. History of hypertension, now controlled on diltiazem,      chlorthalidone, and Benicar.  5. Chronic back pain.  6. Anxiety, stable on Lexapro and alprazolam.  7. Leukocytosis rechecked.      Kingsley Callander. Ouida Sills, MD  Electronically Signed     ROF/MEDQ  D:   07/23/2008  T:  07/24/2008  Job:  [id number]

## 2010-06-07 NOTE — Assessment & Plan Note (Signed)
Travis Booker                       Travis Booker   Travis Booker                 MRN:          [id number]  DATE:10/14/2008                            DOB:          [birth year]    [name] comes in today for management of his paroxysmal atrial  fibrillation, managed with flecainide 75 mg p.o. b.i.d. and diltiazem  Extended Release 120 mg nightly.   He has a history of hypertension, continues to smoke and occasionally  drinks some alcohol.  He does seem to be feeling better and he seems  less depressed today than when he was in the hospital.   He saw Dr. Ouida Sills the other day who started him on hydrochlorothiazide  because his pressures are running around 160-180 systolic.   He is currently on Lexapro 10 mg once a day, flecainide 75 mg p.o.  b.i.d., Cardizem LA 120 mg nightly, Nexium 40 mg two times a day, fish  oil 1000 mg a day, Benicar 20 mg once a day, aspirin 325 mg once a day,  alprazolam 1 mg p.r.n.   He has no known drug allergies.   PHYSICAL EXAMINATION:  VITAL SIGNS:  His blood pressure today is 144/84,  pulse 69 and regular, his height is 70 inches.  He weighs 216 pounds.  BMI is 31.1.  HEENT:  Normocephalic, atraumatic.  NECK:  No JVD.  Carotids upstrokes are equal bilaterally without bruits.  No thyromegaly.  Trachea is midline.  LUNGS:  Some mild expiratory rhonchi.  No rales.  HEART:  Poorly appreciated PMI.  Normal S1 and S2.  No gallop.  ABDOMEN:  Soft.  Good bowel sounds.  EXTREMITIES:  Trace to 1+ edema.  Pulses are intact.  NEUROLOGIC:  Intact.  SKIN:  Unremarkable.   His electrocardiogram shows sinus bradycardia with normal PR, QRS, and  QTc interval.   I had a long talk with Travis Booker and his family.  Because he is having  to quarter of the flecainide tablets, we are going to give him the 50 mg  tablets and have him take 1-1/2 tablets twice a day.  Dr. Ouida Sills is  written for generic  Cardizem LA which I have reinforced is fine.  He  will continue with aspirin 325 mg a day.  I have admonished him about  smoking and drinking certainly to excess.   We will plan on seeing him back again in 6 months.     Thomas C. Daleen Squibb, MD, [facility]  Electronically Signed    TCW/MedQ  DD: 10/14/2008  DT: 10/15/2008  Job #: [id number]   cc:   Kingsley Callander. Ouida Sills, MD

## 2010-06-07 NOTE — Assessment & Plan Note (Signed)
[facility]                       [place] CARDIOLOGY OFFICE NOTE   Travis Booker, Travis Booker                 MRN:          [id number]  DATE:07/13/2008                            DOB:          April 10, 1950    CARDIOLOGIST:  Dr. Valera Castle.   PRIMARY CARE PHYSICIAN:  Dr. Carylon Perches.   REASON FOR VISIT:  Followup.   HISTORY OF PRESENT ILLNESS:  Travis Booker is a 60 year old male patient  with a history of paroxysmal atrial fibrillation who was placed on  flecainide by Dr. Daleen Squibb back in April secondary to recurrent atrial  fibrillation.  The patient has complained of significant amounts of  fatigue.  Please see my last note from Jun 19, 2008.  I proceeded with  an extensive workup that included a CMET, CBC, TSH, and BNP.  His lab  work returned normal except for mildly depressed TSH at 0.321.  His  chest x-ray was normal without acute abnormalities.  I decreased his  diltiazem to 120 mg a day.  We also set him up for a follow-up exercise  treadmill test.  There is no dictation available for the stress test.  Apparently the patient had a significant amount of difficulty with  exercise and only achieved 61% of his maximum age predicted heart rate.  According to the available strips on the chart, no exercise-induced  ventricular arrhythmias were noted.  Today, he continues to note  significant amounts of fatigue.  He says that this all dates back to  when he was found to be back in atrial fibrillation.  This has not  improved even with returning to normal sinus rhythm.  It does not appear  that flecainide is the cause for his symptoms either.  He denies any  syncope.  He denies orthopnea, PND or pedal edema.  He does note  occasional chest pain.  As outlined my previous note, this is usually  associated with meals.  I asked him to increase his Nexium and last  visit and this seems to have improved him symptoms quite significantly.  He also notes  questionable shortness of breath with exertion.  However,  he only describes NYHA class II symptoms.  He does admit to some  episodes of snoring that have been witnessed in the past.  Also, his  wife has noted some apneic episodes in the past.  Unfortunately, they do  not sleep in the same room as she has significant medical problems.  As  noted previously, the patient works third shift.  He has an abnormal  sleeping pattern.  He does not admit to any significant depression  although he remains on Lexapro.  He denies any crying spells or suicidal  ideations.   CURRENT MEDICATIONS:  1. Lexapro 10 mg daily.  2. Diltiazem 120 mg daily.  3. Nexium 40 mg b.i.d.  4. Fish oil 1 gram daily.  5. Aspirin 325 mg daily.  6. Flecainide 75 mg b.i.d.  7. Benicar 40 mg daily.  8. Chlorthalidone 25 mg half tablet daily.   PHYSICAL EXAMINATION:  He is a well-nourished well-developed male in no  acute distress.  Blood pressure  133/76, pulse 69, weight 207 pounds.  HEENT:  Normal.  NECK:  Without JVD at 90 degrees.  CARDIAC:  Normal S1-S2.  Regular rate and rhythm.  LUNGS:  Clear to auscultation bilaterally.  ABDOMEN:  Soft, nontender.  EXTREMITIES:  Without edema.  NEUROLOGIC:  He is alert and oriented x3.  Cranial nerves II-XII grossly  intact.  SKIN:  Warm and dry.   Electrocardiogram demonstrates sinus rhythm with a heart rate of 66,  normal axis, no acute changes.   ASSESSMENT/PLAN:  1. Fatigue.  Workup to this point has been unrevealing.  I have a      strong suspicion that he may have sleep apnea contributing to his      fatigue.  We will assess him further with a home apnea link test.      If this is indicative of sleep apnea, then he will be referred for      formal sleep study.  It is quite possible that his abnormal      sleeping pattern is contributing to his fatigue as well.  His wife      is quite sick and significant social stressors may be contributing.      He denies any  significant changes in his depression.  He is      maintaining sinus rhythm.  At this point, I do not plan on any      further cardiovascular testing.  He will be brought back for close      followup with Dr. Daleen Squibb to review the screening test for sleep apnea      with further recommendations as needed.  2. Hypertension.  This is better controlled on the chlorthalidone.  He      will have a follow-up BMET.  3. Abnormal TSH.  He will have a follow-up TSH, free T4 and free T3.  4. Paroxysmal atrial fibrillation.  It does not appear that flecainide      is contributing to his symptoms.  His symptoms of fatigue predated      the initiation of flecainide and have not improved with restoration      of normal sinus rhythm.  His CHADS II score is 1 with hypertension.      He continues on aspirin therapy.  5. Chest pain and shortness of breath.  As outlined above, it appears      that his chest pain is related more to acid reflux disease than      anything else.  If this continues to be an issue, he may need      referral to gastroenterology.  Of note, he did have a Myoview study      in July 2009 that was negative for ischemia with an ejection      fraction of 59%.  He had an echocardiogram in June 2009 that      demonstrated normal left ventricular function with an ejection      fraction of 60-65%.   DISPOSITION:  The patient will be brought back in followup with Dr. Daleen Squibb  to review the above testing and make further recommendations, if any.      Travis Newcomer, PA-C  Electronically Signed      Travis Friends. Dietrich Pates, MD, [facility]  Electronically Signed   SW/MedQ  DD: 07/13/2008  DT: 07/13/2008  Job #: [id number]   cc:   Travis Callander. Ouida Sills, MD

## 2010-06-07 NOTE — Assessment & Plan Note (Signed)
[facility]                       [place] CARDIOLOGY OFFICE NOTE   Travis Booker, Travis Booker                 MRN:          [id number]  DATE:07/30/2008                            DOB:          06-04-1950    PRIMARY CARDIOLOGIST:  Jesse Sans. Daleen Squibb, MD, [facility]   PRIMARY CARE PHYSICIAN:  Kingsley Callander. Ouida Sills, MD   REASON FOR OFFICE VISIT:  Followup postdischarge.   This is a 60 year old Caucasian male patient with a history of  paroxysmal atrial fibrillation who is on flecainide along with history  of tobacco abuse and EtOH use who was hospitalized secondary to a  syncopal episode on July 23, 2008.  The patient had fainted when he got  out of his car the day of admission at [facility].  He had been drinking  beers that afternoon, smoking marijuana, and also being out in the sun.  The patient passed out while at a gas pump and was brought by EMS to the  emergency room.  The patient has a history of atrial fibrillation, had  been treated with flecainide and we were asked to evaluate him during  hospitalization for cardiac etiology of syncopal episode.   The patient was seen and examined by Dr. Daleen Squibb at [facility].  He did not believe that the syncopal episode is related to a cardiac  etiology.  The patient was continued on his flecainide 75 mg twice a  day.  He was also taken off Coumadin and placed only on aspirin 325 mg  daily.  The patient had a [place] score of only 1.  The patient is followed  up in the office today for evaluation of symptoms.   The patient states that he is feeling much better.  His energy level is  better.  He had found that he was low on the potassium level during  hospitalization and this was repleted and he was placed on potassium as  an outpatient.  The patient states that he has had no further episodes  of syncope and is continuing to hydrate himself and cut down on smoking.  At this time, the patient has no complaints.   PAST  MEDICAL HISTORY:  Paroxysmal atrial fibrillation, hypertension,  chronic back pain, left shoulder surgery including replacement, left  wrist surgeries, 2 back surgeries, anxiety, GERD, appendectomy, and  cholecystectomy.   CURRENT MEDICATIONS:  1. Flecainide 75 mg b.i.d.  2. Diltiazem 120 mg daily.  3. Chlorthalidone 12.5 mg daily.  4. Benicar 40 mg daily.  5. Nexium 40 mg daily.  6. Aspirin 325 daily.  7. Potassium 20 mEq daily.  8. Fish oil daily.  9. Lexapro 10 mg daily.  10.Alprazolam 1 mg daily.  11.Percocet q.i.d. p.r.n.   ALLERGIES:  He has no allergies.   PHYSICAL EXAMINATION:  VITAL SIGNS:  Blood pressure 141/80, pulse 65,  weight 207 pounds, and respirations are 17-20.  HEENT:  Head is normocephalic and atraumatic.  Eyes, PERRLA.  Mucous  membranes in mouth are pink and moist.  Tongue is midline.  NECK:  Supple.  There is no JVD.  There are no carotid bruits  appreciated.  CARDIOVASCULAR:  Regular rate and rhythm, bradycardic, without murmurs,  rubs, or gallops.  LUNGS:  Have some mild crackles.  He does smell of cigarette smoke on  exhalation.  ABDOMEN:  Soft, nontender, 2+ bowel sounds.  EXTREMITIES:  Without clubbing, cyanosis, or edema.   ASSESSMENT:  1. Syncopal Episode- This was found not to be related to cardiac      etiology and believed to be secondary to dehydration prior to      hospitalization  2. Atrial fibrillation, rate controlled on Cardizem.  3. Paroxysmal atrial fibrillation.  The patient remains on Cardizem      120 mg daily and flecainide 75 mg b.i.d.  He is not a Coumadin      candidate with a [place] score of 1 and is now only on aspirin 325 mg      daily.  4. Continued tobacco abuse.  The patient has been advised on tobacco      cessation and encouraged to stop smoking altogether.  5. Hypertension.  The patient's blood pressure is well controlled at      present.  He continues on his Benicar along with other medications      as prescribed.   6. Alcohol use.  The patient has been advised to moderate his alcohol      use to avoid intoxication and dehydration.  The patient will follow      up in the office in 6 months for followup appointment with no      changes in his medications at this time.      Bettey Mare. Lyman Bishop, NP  Electronically Signed      Jesse Sans. Daleen Squibb, MD, [facility]  Electronically Signed   KML/MedQ  DD: 07/30/2008  DT: 07/31/2008  Job #: [id number]   cc:   Kingsley Callander. Ouida Sills, MD

## 2010-06-07 NOTE — Assessment & Plan Note (Signed)
[place] [facility]                       [place] CARDIOLOGY OFFICE NOTE   IVERY, NANNEY                 MRN:          [id number]  DATE:05/20/2008                            DOB:          1950-06-09    Mr. Schmutz comes in today for followup.  We started him on flecainide  100 mg p.o. b.i.d. and remained on aspirin 325 mg per day with a low  [place]aly score.   He also had a recent severe nosebleed and we were afraid to use  Coumadin.   His chief complaint today is that he is just tired, feels bad, has some  headaches.  They are nonspecific and is short of breath when he walks.  He continues to smoke.   His blood pressure is high today at 150/100.  His pulse is 64.  His EKG  at Dr. Alonza Smoker office and I repeated today is sinus rhythm.  He has an  incomplete right bundle.  His PR interval is normal.  QTC is 460  milliseconds.   PHYSICAL EXAMINATION:  He has some expiratory wheezes and rhonchi on  lung exam.  His heart reveals a regular rate and rhythm.  Abdominal exam  is soft, good bowel sounds.  Extremities reveal no edema.  Pulses are  present, but reduced.   ASSESSMENT AND PLAN:  Mr. Olshefski is back in sinus rhythm.  His blood  pressures are quite high today and probably explains a lot of his  symptoms.  I am going to increase his Benicar to 40 mg per day.  Prescription written.  I will see him back in 4 weeks.     Thomas C. Daleen Squibb, MD, [facility]  Electronically Signed    TCW/MedQ  DD: 05/20/2008  DT: 05/21/2008  Job #: [id number]   cc:   Kingsley Callander. Ouida Sills, MD

## 2010-06-07 NOTE — Assessment & Plan Note (Signed)
[facility]                       [place] CARDIOLOGY OFFICE NOTE   Travis Booker, Travis Booker                 MRN:          [id number]  DATE:06/19/2008                            DOB:          Jun 24, 1950    CARDIOLOGIST:  Travis [place]. Travis Squibb, MD, Travis Booker   PRIMARY CARE PHYSICIAN:  Travis [place]. Travis Sills, MD   REASON FOR VISIT:  Weakness.   HISTORY OF PRESENT ILLNESS:  Travis Booker is a 60 year old male patient  with a history of paroxysmal atrial fibrillation who recently was placed  back on flecainide by Dr. Daleen [place] secondary to recurrent AFib.  His last  visit produced an electrocardiogram that demonstrated normal sinus  rhythm.  He apparently had a lot of fatigue when he last saw Dr. Daleen [place]  and he was asked to decrease his flecainide and increase his diltiazem  secondary to elevated blood pressures.  However, the patient is still  continuing to take flecainide 100 mg b.i.d. and diltiazem 240 a day.  He  brings in a long list with his blood pressures over the last several  days that demonstrates pressures reading anywhere from 174 to 139  systolically and 110 to 72 diastolically.  He continues to note  significant amounts of fatigue.  He is unable to do anything without  getting tired.  He does note shortness of breath associated with this.  He also notes occasional episodes of chest discomfort.  This seems more  related to meals than anything else.  It only lasts for a minute or two  and is not related to exertion.  He denies any radiating symptoms or  associated shortness of breath or diaphoresis.  He does take Nexium for  acid reflux disease.  He notes that his sleep is interrupted at night.  He denies any snoring history.  He does work third shift and actually  sleeps during the day.  He does take Lexapro for depression, but denies  any significant symptoms of depression at this time.  He denies  orthopnea or paroxysmal atrial dyspnea.  He denies any significant  pedal  edema.  He denies syncope.  He denies melena, hematochezia, or  hematuria.   CURRENT MEDICATIONS:  1. Lexapro 10 mg daily.  2. Diltiazem 240 mg daily.  3. Nexium 40 mg daily.  4. Fish oil 1 g daily.  5. Aspirin 325 mg daily.  6. Flecainide 100 mg b.i.d.  7. Benicar 40 mg daily.  8. Xanax 1 mg daily.   ALLERGIES:  He has cough secondary to ACE INHIBITORS.   SOCIAL HISTORY:  He continues to smoke cigarettes.   REVIEW OF SYSTEMS:  Please see the HPI.  He does note some cold  intolerance and thinning of his hair.  He denies any fevers.  He had a  URI recently, it was treated with antibiotics per his primary care  physician.  His cough is productive of dark sputum, but this is  improving.  All other systems reviewed and negative.   PHYSICAL EXAMINATION:  GENERAL:  He is a well-nourished, well-developed  male in no acute distress.  VITAL SIGNS:  Blood pressure is 122/80, however, on  repeat by me by  manual cuff, he has a pressure of 150/80 on the left and 140/80 on the  right.  His machine obtained a reading of 158/105 on the left today.  HEENT:  Normal.  Of note, his palpebral conjunctivae bilaterally are  pink.  ENDOCRINE:  Without thyromegaly.  CARDIAC:  Normal S1 and S2.  Regular rate and rhythm without murmur.  LUNGS:  Decreased breath sounds bilaterally, inspiratory and expiratory  wheezes throughout.  ABDOMEN:  Soft, nontender.  EXTREMITIES:  Without edema.  NEUROLOGIC:  He is alert and oriented x3.  Cranial nerves II-XII are  grossly intact.  SKIN:  Warm and dry.  VASCULAR:  Dorsalis pedis and posterior tibial pulses are 2+  bilaterally.   Electrocardiogram reveals sinus bradycardia with a heart rate of 57,  normal axis, no acute changes.   ASSESSMENT AND PLAN:  1. Fatigue.  The etiology of the patient's fatigue is unknown at this      time.  He will have further workup to include a CMET, CBC, and TSH.      He describes some shortness of breath as well.  We  will get a BNP      to rule out the possibility of underlying heart failure.  His lung      exam is somewhat abnormal with his wheezing.  He has been advised      to quit smoking.  We will also obtain a chest x-ray to rule out any      significant pathology.  He is somewhat bradycardic.  I discussed      the case further with Dr. Eden [facility] today.  We reviewed his EKG.  We      will go ahead and decrease his diltiazem down from 240 to 120 a day      to see if this improves some of his symptoms.  It also does not      appear that the patient has had a followup treadmill since he was      placed on flecainide.  This will be arranged as well to rule out      exercise-induced arrhythmia.  We can also see how his heart rate      responds to exercise.  Of note, his QT interval was normal on his      electrocardiogram today.  2. Hypertension.  This is somewhat uncontrolled.  His diltiazem will      be decreased as noted above.  I am also going to add chlorthalidone      at 12.5 mg a day and have him followup with a BMET in a week with a      blood pressure check with the nurse.  3. Gastroesophageal reflux disease.  He has had some symptoms that      sound suggestive of acid reflux causing some chest discomfort.  I      have asked him to increase his Nexium twice a day for this for now.   DISPOSITION:  He will follow up with exercise treadmill test in a week.  We will also have him followup as scheduled in the next several weeks  with Dr. Daleen [place].  He can return sooner p.r.n.      Travis Newcomer, PA-C  Electronically Signed      Travis [place]. Travis Emms, MD, [facility]  Electronically Signed   SW/MedQ  DD: 06/19/2008  DT: 06/20/2008  Job #: [id number]   cc:   Travis [facility]. Travis Sills, MD

## 2010-06-07 NOTE — Group Therapy Note (Signed)
NAMEKRISTOFER, SCHAFFERT          ACCOUNT NO.:  [id number]   MEDICAL RECORD NO.:  [id number]          PATIENT TYPE:  INP   LOCATION:  A319                          FACILITY:  [facility]   PHYSICIAN:  Edward L. Juanetta Gosling, M.D.DATE OF BIRTH:  1950/11/15   DATE OF PROCEDURE:  07/14/2007  DATE OF DISCHARGE:                                 PROGRESS NOTE   Mr. Diamant is I think a little better.  He remains mostly in sinus  rhythm.  He is being anticoagulated.  He says he is better since he has  been using his nicotine patch.  He still feels weak.   His exam shows his temperature is 97.9, pulse 59, respirations 20, and  blood pressure 156/88.  His chest is fairly clear.  His heart is regular  now.  His abdomen is soft.   ASSESSMENT:  He has atrial fibrillation which is a new diagnosis.  He is  on Coumadin.  He is on Cardizem.  He seems to be slowly improving.  I  would like Cardiology to see him before he goes home.  His PT/INR today  14 and 1.0.      Edward L. Juanetta Gosling, M.D.  Electronically Signed     ELH/MEDQ  D:  07/15/2007  T:  07/15/2007  Job:  [id number]

## 2010-06-07 NOTE — Op Note (Signed)
NAMESTANDLEY, BARGO NO.:  [id number]   MEDICAL RECORD NO.:  [id number]          PATIENT TYPE:  INP   LOCATION:  2899                         FACILITY:  [facility]   PHYSICIAN:  Claude Manges. Whitfield, M.D.DATE OF BIRTH:  1950-08-16   DATE OF PROCEDURE:  02/19/2007  DATE OF DISCHARGE:                               OPERATIVE REPORT   PREOPERATIVE DIAGNOSIS:  Osteoarthritis, left shoulder.   POSTOPERATIVE DIAGNOSIS:  Osteoarthritis, left shoulder.   PROCEDURE:  Hemiarthroplasty left shoulder.   SURGEON:  Claude Manges. Cleophas Dunker, M.D.   ASSISTANT:  Thereasa Distance A. Chaney Malling, M.D.   ANESTHESIA:  General.   COMPLICATIONS:  None.   COMPONENTS:  [employer] Advantage 12 mm humeral stem, a 48 mm outer  diameter humeral head with a 15 mm neck.  Components were press fit.   PROCEDURE:  With the patient comfortable on the operating table and  under general orotracheal anesthesia the patient was placed in the  slightly semi-sitting position with a shoulder frame with the left upper  extremity draped free.  The shoulder was then prepped with Betadine  scrub and DuraPrep from the base of neck circumferentially to the wrist.  Sterile draping was performed.   A deltopectoral groove incision was utilized and via sharp dissection  carried down to subcutaneous tissue and by Bovie dissection the soft  tissue was elevated off the deltoid.  I was able to easily identify the  deltopectoral groove and the cephalic vein.  The vein was retracted  laterally and the groove was then bluntly dissected.  A self-retaining  retractor was inserted.  The coracoid was identified along with the  conjoined tendon.  The clavipectoral fascia was also identified and  incised longitudinally along length of the skin incision.  The  clavipectoral fascia and conjoined tendon were then retracted medially.  The subscapularis was identified with external rotation of the shoulder.  The patient had at least 45 degrees  of external rotation preoperatively.   At a point about a centimeter from its attachment of the humeral head,  the subscapularis was incised from its superior to inferior border.  It  was tagged with #1 Ethilon suture superiorly and inferiorly.  It was  taken one layer with the capsule and subscapularis.   There were some areas of synovitis entering the joint and these were  debrided.  The capsule was debrided.  There was about 10 mL of yellow  joint fluid.   There were large osteophytes along the inferior humeral head.  With  further capsule release we were able to deliver the humeral head.  A  drill hole was then placed at the mid section of the superior head.  The  biceps tendon was identified and carefully protected.  Hand reaming was  then performed to 12 mm.  With the 12 mm reamer in place we then used  the cutting guide and aligned it so that we had the appropriate  retroversion angle.   The oscillating saw was then used to remove the humeral head over the  cutting guide.  We thought we had perfect position.  Any osteophytes  were then  removed with an osteotome.  The cutting jig was then removed  followed by the reamer.  Rasping was then performed sequentially to 12  mm.  With a 12 mm rasp in place we felt we were directly in the center  of the humeral head.  We trialed a 48 mm humeral head with the 15 mm  neck length.  It felt that we had perfect construct.  The glenoid was  without deformation and still had articular cartilage.  There were no  loose bodies.  I did not see any evidence of rotator cuff tear.   We elected to proceed with the final components.  The final 12 mm  humeral stem was then impacted flush on the cut surface of the humeral  head.  We cleaned the [facility] taper area and the stem and then inserted  the 48 mm outer diameter humeral head with a 15 mm neck length.  The  acetabulum was irrigated.  It was inspected without evidence of loose  material and entire  construct was reduced.  We had about the 50%  posterior subluxation, about the same anteriorly.  We did not feel like  we were too tight.  We had nice abduction without any difficulty.   Wound was again irrigated with saline solution.  The subscapularis was  closed anatomically with interrupted #1 Ethibond.  The end of the  rotator interval was also closed superiorly.   The wound was again irrigated with saline solution.  The deltopectoral  groove was closed with running 0-0 Vicryl, subcu closed in several  layers with 0-0 and 2-0 Vicryl, skin closed with skin clips.  I  infiltrated the deep tissue with Marcaine with epinephrine.  Sterile  bulky dressing was applied followed by a sling.   The patient tolerated procedure without complications.      Claude Manges. Cleophas Dunker, M.D.  Electronically Signed     PWW/MEDQ  D:  02/19/2007  T:  02/19/2007  Job:  [id number]

## 2010-06-07 NOTE — Assessment & Plan Note (Signed)
[facility]                       [place] [facility] OFFICE NOTE   Travis Booker, Travis Booker                 MRN:          [id number]  DATE:06/10/2008                            DOB:          [birth year]    Mr. Zumbro comes in today for followup of his paroxysmal atrial  fibrillation.  He is feeling somewhat dizzy and lightheaded and just  does not feel right.  He asked me if I had an exorcism in the office.   He has finally quit smoking.  He continues to have a cough and he is  short of breath.  He has multiple arthritic complaints which I am  finding to be true each time he comes.  He really does not want to go  back to work.   CURRENT MEDICATIONS:  1. Lexapro 10 mg per day.  2. Diltiazem 240 mg a day.  3. Nexium 40 mg a day.  4. Fish oil 1000 mg per day.  5. Aspirin 325 mg a day.  6. Flecainide 100 mg p.o. b.i.d.  7. Benicar 40 mg a day.   He brings in blood pressures per my request which show that his  systolics are still running about 150 and his diastolics in the mid 90s.  Today, however, in the office his blood pressure is 130/92, his pulse is  64 and regular.  He is in sinus rhythm.  EKG is stable.  Weight is 215  which is up 3.  Skin is warm and dry.  His respiratory rate is 18.  He  is in no acute distress.  He seems somewhat depressed.  Lungs are clear  to auscultation.  Heart reveals a normal S1 and S2.  No gallop.  Abdominal exam is soft.  Good bowel sounds.  Extremities reveal only  trace edema.  Pulses are intact.   Mr. Cull has multiple complaints today which were difficult to sort  out.  His blood pressure is clearly not under optimal control.  Some of  this could be related to blood pressures and some could be related  possibly to flecainide.   PLAN:  1. Decrease flecainide to 75 mg p.o. b.i.d.  2. Increase diltiazem extended release to 360 mg a day.  3. Continue to check blood pressure.  4. I have written him a  2-week temporary excuse for work until he gets      stronger and better.  We will see him back in about 4 weeks.     Thomas C. Daleen Squibb, MD, [facility]  Electronically Signed    TCW/MedQ  DD: 06/10/2008  DT: 06/11/2008  Job #: [id number]   cc:   Kingsley Callander. Ouida Sills, MD

## 2010-06-07 NOTE — Discharge Summary (Signed)
NAMECLEVON, Travis Booker          ACCOUNT NO.:  [id number]   MEDICAL RECORD NO.:  [id number]         PATIENT TYPE:  PINP   LOCATION:  A319                          FACILITY:  [facility]   PHYSICIAN:  Edward L. Juanetta Gosling, M.D.DATE OF BIRTH:  08/16/1950   DATE OF ADMISSION:  07/11/2007  DATE OF DISCHARGE:  06/22/2009LH                               DISCHARGE SUMMARY   DISCHARGE DIAGNOSES:  1. New onset atrial fibrillation.  2. Multiple orthopedic abnormalities.  3. Probable chronic obstructive pulmonary disease based on chest x-ray      findings and physical examination.  4. Hypertension.  5. Depression.   HISTORY:  [name] is a patient of Dr. Ouida Sills who was admitted with  atrial fibrillation.  He had been doing fairly well, said that he has  had a fairly large amount of alcohol on the night prior to admission and  then developed palpitations.  He says that he felt like his heart was  irregular and when he came to the emergency room indeed it was irregular  and he was noted to have atrial fibrillation.  He has not had previous  similar episodes, although for the last 2-3 months he has been weak and  not feeling as well.  His physical exam on admission showed that he was  in atrial fibrillation, blood pressure about 150/100.  He had some  rhonchi bilaterally.  He had fairly recent shoulder surgery and had a  scar on his left shoulder.  His lab work showed that he ruled out for  myocardial infarction.  He had normal TSH level.  His cholesterol was  fairly good.  His HDL cholesterol was slightly low.  His triglyceride  was slightly high.  His HDL cholesterol was slightly low.  He had an  echocardiogram which apparently is not available as yet.  He had a  consultation with [facility] who felt that he should be  anticoagulated.  This was being set up.  He said that he still had a lot  of cough and he did not feel well.  I reviewed his chest x-ray.  At  least back to about 2006,  although all of the x-rays do not mention  this, I think they showed findings that were consistent with COPD,  particularly chronic bronchitis.  I told him I think he has that since  he had a significant smoking history.  I have asked him to try to stop  smoking and start to use a nicotine patch to help with that.  By the  time of discharge, he was back in sinus rhythm.  He had been placed on  Cardizem.  He is discharged home in improved condition to continue with  his previous Xanax 1 mg 4 times, Percocet 5/325, 4 times a day.  He is  going to stop Norvasc and switch that to Cardizem CD 240 daily.  He is  going to be on Coumadin 10 mg daily.  He is going to follow in the  [facility].  I am going to try to locate his  echocardiogram.  He said he had some interest in perhaps  getting a  second opinion about his atrial fibrillation to see what sort of danger  he has and I have explained him that it is dangerous mostly of stroke  and because his wife who was present at the time of discharge said that  he did much  better when he was on Lexapro.  I am going to put him back on Lexapro 10  mg daily.  I do not plan to change any other medications.  He is going  to follow up with Dr. Valinda Hoar, he is going to follow up in the Coumadin  Clinic and see how he does.      Edward L. Juanetta Gosling, M.D.  Electronically Signed     ELH/MEDQ  D:  07/15/2007  T:  07/16/2007  Job:  [id number]

## 2010-06-07 NOTE — Group Therapy Note (Signed)
Travis Booker, Travis Booker          ACCOUNT NO.:  [id number]   MEDICAL RECORD NO.:  [id number]          PATIENT TYPE:  INP   LOCATION:  A319                          FACILITY:  [facility]   PHYSICIAN:  Edward L. Juanetta Gosling, M.D.DATE OF BIRTH:  [birth year]   DATE OF PROCEDURE:  DATE OF DISCHARGE:                                 PROGRESS NOTE   Patient of Dr. Alonza Smoker.  Mr. Luis still says he is uncomfortable, has  an uncomfortable awareness of his heartbeat.  He is in sinus rhythm with  first-degree AV block by monitor, however.  Otherwise, he says he is  having a lot of shoulder pains.  He is having some agitation, he thinks  from coming off of nicotine, so I am going to put him back on a nicotine  patch.   His physical exam shows his temperature is 97.7, pulse 50, respirations  20, blood pressure 126/79, and O2 sat is 98%.  His chest is fairly  clear.  He has had shoulder surgery fairly recently, which is pretty  stable.   His white blood count is 6100, hemoglobin is 13.4, and platelets 185.  Protime 12.8 with an INR of 0.9, but he is more stable as mentioned.  He  says he has not been out of bed much.  I have asked him to get up and  move around.  He is in sinus rhythm, so we may not need to fully  anticoagulate him if he stays in sinus rhythm.  I would like to see if  we can get him up and moving.  I am going to put a nicotine patch on him  to try to help with his symptoms of being agitated and see what happens  with his heart rate as he gets up and moves.      Edward L. Juanetta Gosling, M.D.  Electronically Signed     ELH/MEDQ  D:  07/13/2007  T:  07/13/2007  Job:  [id number]

## 2010-06-07 NOTE — Assessment & Plan Note (Signed)
[facility]                       [place] [facility] OFFICE NOTE   Travis Booker, Travis Booker                 MRN:          [id number]  DATE:07/24/2007                            DOB:          28-Jan-1950    Travis Booker is a patient most recently seen in the hospital by Dr. Tenny Craw.   I am not sure how he ended up on my schedule.  He had an episode of  atrial fibrillation.  He was placed on warfarin.  He has had 2 Coumadin  Clinic followups here in our office.  His initial INR was 1.8; however,  he was high today at 5.8.  The patient has had a normal 2D  echocardiogram when in the hospital with an EF of 60-65% and no  significant valve disease.  This was done on July 12, 2007.   The patient is complaining of some atypical chest pain.  It is sharp.  It is in the center of his chest.  It radiates to the left shoulder.  He  has had previous left shoulder surgery by Dr. Chaney Malling in January 2009,  I suspect it is more related to this.  There is no associated  diaphoresis or shortness of breath.   Since hospital discharge he says he has stopped smoking.  He is wearing  a nicotine patch of 21 mg, and I talked to him at length about the long-  term risk of going back to smoking.   He understands that the nicotine replacement needs to be tapered.  I  suspect at his next office visit if he continues to wear the patch it  should be decreased to a 14-mg patch, and I went over this in detail  with the patient.  He continues to have flip-flops in his heart.  He is  in sinus rhythm today.  I told him it would be worthwhile to get an  event monitor for 4 weeks to see if he is going in and out of  fibrillation.   The patient initially presented to the ER on July 12, 2007, with 3- to 4-  week history of weakness.  He continues to feel malaise and weakness  despite being in sinus rhythm.  I told him this was not likely to be  from his heart.  In regards to his chest  pain, he needs to have further  workup for coronary disease, particularly given his smoking history.  He  also has significant alcohol intake.  He drinks about 18 beers per week.   His review of systems is otherwise negative.   He is on:  1. Warfarin.  2. Lexapro 10 a day.  3. Cardizem 240 a day.  4. Nicotine patch 21 mg.  5. Nexium 40 a day.  6. Fish oil.   PHYSICAL EXAMINATION:  Remarkable for an elderly white male in no  distress.  Affect appropriate.  He is in sinus rhythm at a rate of 60.  His blood pressure is 120/70,  and weight is 224.  HEENT:  Unremarkable.  NECK:  Carotids are normal without bruit.  No lymphadenopathy,  thyromegaly, or JVP elevation.  LUNGS:  Clear.  Good diaphragmatic motion.  No wheezing.  He is status  post left shoulder surgery.  CARDIAC:  S1 and S2.  Normal heart sounds.  PMI normal.  ABDOMEN:  Benign.  He is status post appendectomy and gallbladder  surgery.  No triple A.  No bruit.  No tenderness.  No hepatosplenomegaly  or hepatojugular reflux.  EXTREMITIES:  Distal pulses are intact.  No edema.  NEUROLOGIC:  Nonfocal.  SKIN:  Warm and dry.  MUSCULOSKELETAL:  No muscular weakness.   EKG shows sinus bradycardia and is otherwise normal.   IMPRESSION:  1. Chest pain in a smoker with recent hospitalization for atrial      fibrillation.  Followup stress Myoview next week.  2. Atrial fibrillation on Coumadin.  The patient's Congestive heart      failure, Hypertension, Age, Diabetes, prior Stroke score is fairly      low.  He will follow up in our Coumadin Clinic.  I will leave it up      to Dr. Tenny Craw to further decide how long he needs to be on Coumadin,      although if his event monitor does not show frequent recurrences I      would suggest that he stop Coumadin after 6 months.  3. Previous left shoulder surgery.  Follow up with Orthopedics if he      continues to have problems with it.  Consider Naprosyn for      antiinflammatory.   The  patient was asking me about being out of work.  I told him from a  cardiac perspective I could not do this.  He has normal left ventricular  function.  He is no longer in atrial fibrillation, and he has no  documented coronary disease.  He works at the [employer], he has  been there for 30 years.  I told him to talk to his orthopedic doctor or  primary care doctor regarding any possibility of staying out of work.     Noralyn Pick. Eden Emms, MD, [facility]  Electronically Signed    PCN/MedQ  DD: 07/24/2007  DT: 07/25/2007  Job #: [id number]

## 2010-06-07 NOTE — Assessment & Plan Note (Signed)
[facility]                       [place] [facility] OFFICE NOTE   Ishaaq, Penna MARY HOCKEY                 MRN:          [id number]  DATE:08/28/2007                            DOB:          1950/07/09    Mr. Stidd returns today for further management of his atrial  fibrillation.   Please see the extensive notes by Dr. Tenny Craw and Dr. Eden Emms.   In summary, he has had a stress Myoview, which showed excellent exercise  tolerance with the EF of 59%.  There was no ischemia.  There were no  dysrhythmias.  In addition, he has had a 2D echocardiogram, which was  completely normal.  He has an EF of 60-65% and no mention of left atrial  enlargement.   He has hypertension and this could have been what triggered his atrial  fibrillation.   He has been on warfarin, but it has been very difficult to control his  INR, which had been very erratic per our Coumadin Clinic.  He smokes and  he is trying to cut back.  He is under a lot of stress.   MEDICATIONS:  1. Warfarin.  2. Lexapro 10 mg a day.  3. Diltiazem extended-release 240 every day.  4. Nexium 40 mg a day.  5. Fish oil 1000 mg a day.  6. Xanax p.r.n.   PHYSICAL EXAMINATION:  VITAL SIGNS:  Blood pressure is 144/88, his pulse  is 78 and regular, and his weight is 222.  HEENT:  Normocephalic and atraumatic.  PERRLA.  Extraocular movements  intact.  Sclerae clear. Facial asymmetry is normal.  Carotid upstrokes  are equal bilaterally without bruits.  No JVD.  Thyroid is not enlarged.  Trachea is midline.  LUNGS:  Reveals an inspiratory and expiratory rhonchi.  Reduced breath  sounds in the bases diffusely.  HEART:  Reveals non-displaced PMI.  Normal S1 and S2.  No murmur.  ABDOMEN:  Soft and good bowel sounds.  EXTREMITIES:  No cyanosis, clubbing, or edema.  Pulses are intact.  NEUROLOGIC:  Intact.  SKIN:  Unremarkable.   Looking at his event recorder, he has been in sinus bradycardia in the  40s and mostly in the 50s low 60s in sinus rhythm.  No atrial fib noted.   ASSESSMENT AND PLAN:  Mr. Monts atrial fibrillation is now back in  sinus rhythm.  His only contributing factor is hypertension, which is  still not well controlled.  His Coumadin has been incredibly difficult  to control and his [place] Score is fairly low.   PLAN:  1. Add lisinopril 20 mg a day.  2. Continue diltiazem extended-release 240 a day.  3. Change warfarin to aspirin 325 mg a day.  4. Discontinue smoking or cut back as much as possible.   Looking back to his chart, his creatinine is 0.86.  He tends to run a  low potassium and his TSH was normal.   I will plan on seeing him back in 6 months.  He will come in for blood  pressure check in 2 weeks.     Thomas C. Daleen Squibb, MD, [facility]  Electronically Signed  TCW/MedQ  DD: 08/28/2007  DT: 08/29/2007  Job #: [id number]   cc:   Kingsley Callander. Ouida Sills, MD

## 2010-06-07 NOTE — H&P (Signed)
NAMEJOVONI, Travis Booker          ACCOUNT NO.:  [id number]   MEDICAL RECORD NO.:  [id number]          PATIENT TYPE:  INP   LOCATION:  A319                          FACILITY:  [facility]   PHYSICIAN:  Kingsley Callander. Ouida Sills, MD       DATE OF BIRTH:  Jan 10, 1951   DATE OF ADMISSION:  07/11/2007  DATE OF DISCHARGE:  LH                              HISTORY & PHYSICAL   CHIEF COMPLAINT:  Heart racing.   HISTORY OF PRESENT ILLNESS:  This patient is a 60 year old white male  with a history of hypertension, who presented to the emergency room  after he had the sudden onset of heart racing on the morning of  admission.  He felt some intermittent mild tightness in the chest and  felt his heart was beating rapidly.  He did not experience diaphoresis,  vomiting, or syncope.  He has a history of a negative Cardiolite stress  test in 2002.  He denied recreational drug use.  He had consumed  approximately 5 beers the night before admission.  He is a smoker.  He  does not have diabetes.  He has a history of a low HDL.  He was found to  be in atrial fibrillation with a rapid rate of approximately 140.  He  was treated with IV diltiazem.  He can revert spontaneously to sinus  rhythm.  He was then treated with oral diltiazem.   PAST MEDICAL HISTORY:  1. Hypertension.  2. 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  3. Low HDL.  4. ED.  5. GERD.  6. Anxiety.  7. Cholecystectomy.  8. Appendectomy.  9. Left wrist fusion.  10.Left shoulder replacement.  11.Chronic back pain.   MEDICATIONS:  1. Norvasc 10 mg daily.  2. Nexium 40 mg daily.  3. Percocet b.i.d. p.r.n.  4. Xanax 1 mg b.i.d.   ALLERGIES:  None.   SOCIAL HISTORY:  As above.  He works at [employer].   FAMILY HISTORY:  Remarkable for hypertension in his father and dementia  in his mother.   REVIEW OF SYSTEMS:  He has had persistent left shoulder pain and back  pain.  He has had recent fatigue.  No change in the bowel or bladder  habits.   PHYSICAL EXAMINATION:  GENERAL:  Alert and oriented.  HEENT:  No scleral icterus.  Pharynx is unremarkable.  NECK:  No JVD or thyromegaly.  LUNGS:  Clear.  HEART:  Irregularly irregular at approximately 70 beats per minute at  the time of my exam.  ABDOMEN:  Nontender.  No hepatosplenomegaly.  He is overweight.  EXTREMITIES:  There is a left wrist fusion and diminished grips.  No  clubbing or edema.  There is diminished range of motion and tenderness  in the left shoulder.  Surgical wounds are well healed.  NEURO:  At baseline.   LABORATORY DATA:  Troponins less than 0.05 and 0.14.  Sodium 135,  potassium 3.5, glucose 110, BUN 12, and creatinine 0.8.  White count 8.6  and hemoglobin 15.4.  First EKG reveals atrial fibrillation at 140 beats  per minute.  Chest  x-ray reveals no acute infiltrate.   IMPRESSION:  1. New-onset atrial fibrillation.  He will be hospitalized in a      monitored setting.  He will be treated with oral diltiazem.  He      will be anticoagulated initially with Lovenox.  An echocardiogram      and a TSH will be obtained.  Cardiology will be consulted.  Serial      cardiac enzymes will be obtained.  2. Hypertension.  We will hold Norvasc.  3. Borderline low potassium.  We will supplement orally.  4. Chronic back pain.  Continue Percocet.  5. Status post left shoulder replacement.  6. Anxiety.  Continue Xanax.  7. Gastroesophageal reflux disease.  Continue proton pump inhibitor      therapy.      Kingsley Callander. Ouida Sills, MD  Electronically Signed     ROF/MEDQ  D:  07/12/2007  T:  07/13/2007  Job:  [id number]

## 2010-06-07 NOTE — Consult Note (Signed)
Travis Booker          ACCOUNT NO.:  [id number]   MEDICAL RECORD NO.:  [id number]          PATIENT TYPE:  INP   LOCATION:  A335                          FACILITY:  [facility]   PHYSICIAN:  Thomas C. Wall, MD, FACCDATE OF BIRTH:  02-13-1950   DATE OF CONSULTATION:  DATE OF DISCHARGE:  07/23/2008                                 CONSULTATION   Cardiology consultation requested by Dr. Carylon Perches to evaluate  Travis Booker with syncope.   Travis Booker is a 60 year old married white male who fainted when got out  of his car yesterday at [facility].   His history is significant for drinking 3 beers at afternoon, smoking  grass, and then smoking some marijuana.   He has no antecedent symptoms prior to hitting the ground.  There was no  personal injury.  His wife was present to witness it.   He was brought to the emergency room where he had an extensive  evaluation including a CT of the head, which is negative for any acute  abnormality, CT of the chest and abdomen, which did not show any  abnormalities including pulmonary embolus or thoracic aorta or abdominal  aortic tear or dissection.   Cardiac markers have been negative.  His EKG shows sinus bradycardia  with normal PR, QRS, and QTC intervals.  He has been in sinus brady here  with no atrial arrhythmias.   His history is significant for atrial fibrillation treated with  flecainide.  Please refer the extensive notes from E-chart from our  office here at [facility].   His history is also notable for having a dose of flecainide initiation.  Treadmill that showed no proarrhythmia.   LABORATORY DATA:  Significant for hypokalemia.  He also had an alcohol  level of 16.  He had a urine drug screen, which was positive for  marijuana.   PAST MEDICAL HISTORY:  Significant for the above.  He has hypertension,  had tobacco use in the past but he did quit, low HDL, gastroesophageal  reflux, rectal dysfunction, anxiety, chronic back  pain, and arthritis.   PAST SURGICAL HISTORY:  Cholecystectomy and appendectomy.   MEDICATIONS ON ADMISSION:  1. Flecainide 25 mg p.o. b.i.d.  2. Lexapro 10 mg a day.  3. Diltiazem extended release 120 mg a day.  4. Nexium 40 mg b.i.d.  5. Fish oil 1 g daily.  6. Benicar 40 mg daily.  7. Chlorthalidone 25 mg 1/2 a tablet daily.   ALLERGIES:  He has no known drug allergies.   SOCIAL HISTORY:  He lives in [place].  He is married.  He is seeking  Disability.  __________  come to the office.  He sees me or Dr. Ouida Sills.   FAMILY HISTORY:  Remarkable for hypertension in his father and his  mother.   REVIEW OF SYSTEMS:  Other than HPI is negative.  He has chronic left  shoulder pain, back pain, these have been evaluated in the past.   PHYSICAL EXAMINATION:  GENERAL:  He is in no acute distress.  VITAL SIGNS:  Blood pressure is 120/70, his pulse is 53 and sinus brady.  Monitoring will be needed.  Respiratory rate is 18, he is afebrile, and  saturations 93%.  HEENT:  Essentially normal.  Dentition in full repair.  NECK:  Carotids upstrokes are equal bilaterally without bruits.  Thyroid  is enlarged.  Trachea is midline.  LUNGS:  Clear to auscultation and percussion except for expiratory  rhonchi, and some decreased breath sounds throughout.  HEART:  Nondisplaced PMI.  Regular rate and rhythm.  No gallop, rub, or  murmur.  ABDOMEN:  Soft.  Good bowel sounds, nondistended.  No obvious  organomegaly.  EXTREMITIES:  No cyanosis or clubbing.  There is trace edema.  Pulses  were intact.  No sign of DVT.  NEUROLOGIC:  Grossly intact.  No sign of outside injury.   LABORATORY DATA:  EKG reviewed as above.   ASSESSMENT:  Probable orthostatic hypotension with syncope or vasovagal  syncope secondary to probable dehydration on alcohol, marijuana.  I do  not think this is related to the proarrhythmic effect, flecainide is  dispensed by Dr. Ouida Sills.   PLAN:  1. Continue flecainide 25 mg p.o.  b.i.d.  2. Continue the medication prior to admission including diltiazem      extended release 120 mg a day.  3. With a CHADS2 score of only 1, he will stay on aspirin 325 mg a      day.   We will see him back in the office as scheduled.   Thank you very much for this consultation.  I have discussed this case  with Dr. Ouida Sills and the patient.      Thomas C. Daleen Squibb, MD, [facility]  Electronically Signed     TCW/MEDQ  D:  07/23/2008  T:  07/24/2008  Job:  [id number]

## 2010-06-07 NOTE — Group Therapy Note (Signed)
NAMESOFIA, JAQUITH          ACCOUNT NO.:  [id number]   MEDICAL RECORD NO.:  [id number]          PATIENT TYPE:  INP   LOCATION:  A319                          FACILITY:  [facility]   PHYSICIAN:  Edward L. Juanetta Gosling, M.D.DATE OF BIRTH:  [birth year]   DATE OF PROCEDURE:  07/15/2007  DATE OF DISCHARGE:                                 PROGRESS NOTE   Mr. Greenslade says he really does not feel well at all today.  He is  congested.  He is complaining of pain in his neck.  He is so week.  He  says that he staggers when he gets up.  He is still in sinus rhythm with  a fairly slow rate.  He is coughing and bringing up some sputum.   His exam otherwise temperature is 97.4, pulse 60, respirations 20, and  blood pressure 135/76.  His chest shows significant rhonchi bilaterally.  His heart is slow.  Abdomen is soft.  Extremities showed no edema.   ASSESSMENT:  He is still having significant trouble.  I am going to have  him get orthostatic blood pressure checks.  I asked for Cardiology to  see him again and try to get a plan as to when he could be potentially  discharged.  I do not think he is going to be able to go today, but  hopefully we can get him and moving.  I am going to ask for PT  consultation as well.      Edward L. Juanetta Gosling, M.D.  Electronically Signed     ELH/MEDQ  D:  07/15/2007  T:  07/15/2007  Job:  [id number]

## 2010-06-10 NOTE — Op Note (Signed)
NAME:  Travis Booker, Travis Booker                    ACCOUNT NO.:  [id number]   MEDICAL RECORD NO.:  [id number]                   PATIENT TYPE:  AMB   LOCATION:  DAY                                  FACILITY:  [facility]   PHYSICIAN:  R. Roetta Sessions, M.D.              DATE OF BIRTH:  24-Apr-1950   DATE OF PROCEDURE:  10/07/2003  DATE OF DISCHARGE:                                 OPERATIVE REPORT   PROCEDURE:  Screening colonoscopy.   ENDOSCOPIST:  Gerrit Friends. Rourk, M.D.   INDICATIONS FOR PROCEDURE:  The patient is a 60 year old gentleman devoid of  any lower GI tract symptoms.  Referred at the courtesy of Dr. Ouida Sills for  colorectal cancer screening.  He has never had a colonoscopy.  There is no  family history of colorectal cancer.  Colonoscopy is now being done as a  standard screening maneuver.  This approach has been discussed with the  patient at length.  The potential risks, benefits, and alternatives have  been reviewed; questions answered.  He is agreeable.  Please see the  documentation in the medical record for more information.   PROCEDURE NOTE:  O2 saturation, blood pressure, pulse and respirations were  monitored throughout the entire procedure.  Conscious sedation: Versed 6 mg IV, Demerol 100 mg IV in divided doses.   INSTRUMENT:  Olympus video chip system.   FINDINGS:  Digital rectal exam revealed no abnormalities.   ENDOSCOPIC FINDINGS:  The prep was good.   RECTUM:  Examination of the rectal mucosa including the retroflex view of  the anal verge revealed no abnormalities.   COLON:  The colonic mucosa was surveyed from the rectosigmoid junction  through the left transverse and right colon to the area of the appendiceal  orifice, ileocecal valve, and cecum.  These structures were well seen and  photographed for the record.   From this level the scope was slowly withdrawn.  All previously mentioned  mucosal surfaces were again seen.  The patient was noted to have an  approximately 1.5-to-2-cm submucosal lesion consistent with a lipoma in the  midleft colon -- please see photos.  The remainder of the colonic mucosa  appeared normal.  The patient tolerated the procedure well and was reacted  in endoscopy.   IMPRESSION:  1.  Normal rectum.  2.  Submucosal nodule in the mid descending colon consistent with a lipoma,      not manipulated, otherwise normal colon.   RECOMMENDATIONS:  Repeat colonoscopy for screening purposes in 10 years.      ___________________________________________                                            Jonathon Bellows, M.D.   RMR/MEDQ  D:  10/07/2003  T:  10/07/2003  Job:  [id number]   cc:   Channing Mutters  Marvene Staff, M.D.  [address]  [place]  [place] [address]  Fax: [phone]   R. Roetta Sessions, M.D.  [address]  [place]  [place] [address]  Fax: [phone]

## 2010-06-10 NOTE — Discharge Summary (Signed)
Travis Booker, Travis Booker          ACCOUNT NO.:  [id number]   MEDICAL RECORD NO.:  [id number]         PATIENT TYPE:  PINP   LOCATION:  A335                          FACILITY:  [facility]   PHYSICIAN:  Kingsley Callander. Ouida Sills, MD       DATE OF BIRTH:  [birth year]   DATE OF ADMISSION:  DATE OF DISCHARGE:  07/01/2010LH                               DISCHARGE SUMMARY   DISCHARGE DIAGNOSES:  1. Vasovagal syncope.  2. Paroxysmal atrial fibrillation.  3. Hypertension.  4. Chronic back pain.  5. Gastroesophageal reflux disease  6. Anxiety.  7. Hypokalemia.   HOSPITAL COURSE:  This patient is a 60 year old male who presented after  passing out at the gas station at [employer].  He had been quite active on  the date of admission.  He has had consumed alcohol and smoked  marijuana.  He had a low normal blood pressure initially.  He was  treated with IV fluids.  He had an extensive evaluation in the emergency  room which revealed a positive drug screen for benzodiazepines and  marijuana.  His alcohol level was 16.  He had CT scans of the head,  chest and abdomen which were negative.  He has a history of paroxysmal  atrial fibrillation, but was in sinus rhythm.  He is on flecainide to  maintain sinus rhythm.  He was hypokalemic initially at 2.8.  He was  given potassium supplements.  A recheck was 3.4.  He was euthyroid with  a TSH of 0.68.  His initial white count was 16.1.  A repeat was 8.6.  His troponin level was less than 0.05.   He was observed in a monitored setting.  No arrhythmias were identified.  He was seen in Cardiology consultation by Dr. Daleen Squibb.  He was felt to have  noncardiogenic syncope, likely vasovagal or an orthostatic event.  Flecainide was continued at 75 mg b.i.d.  He was felt to be stable for  discharge on the evening of the 1st with followup to be scheduled in my  office in 1 week.   DISCHARGE MEDICATIONS:  1. Flecainide 75 mg b.i.d.  2. Diltiazem 120 mg daily.  3. Chlorthalidone  12.5 mg daily.  4. Benicar 40 mg daily.  5. Nexium 40 mg daily.  6. Aspirin 325 mg daily.  7. Potassium 20 mEq daily.  8. Fish oil daily.  9. Lexapro 10 mg daily.  10.Alprazolam 1 mg b.i.d. p.r.n.  11.Percocet 5/325 q.i.d. p.r.n.   He was cautioned regarding hydration and the avoidance of alcohol and  recreational substances.      Kingsley Callander. Ouida Sills, MD  Electronically Signed     ROF/MEDQ  D:  08/07/2008  T:  08/07/2008  Job:  [id number]

## 2010-08-03 ENCOUNTER — Ambulatory Visit
Admission: RE | Admit: 2010-08-03 | Discharge: 2010-08-03 | Disposition: A | Discharge status: Home / Self Care | Payer: BC Managed Care – PPO | Source: Ambulatory Visit | Attending: Neurosurgery | Admitting: Neurosurgery

## 2010-08-03 ENCOUNTER — Other Ambulatory Visit: Payer: Self-pay | Admitting: Neurosurgery

## 2010-08-03 DIAGNOSIS — M542 Cervicalgia: Secondary | ICD-10-CM

## 2010-09-05 ENCOUNTER — Other Ambulatory Visit: Payer: Self-pay | Admitting: Neurosurgery

## 2010-09-05 DIAGNOSIS — M542 Cervicalgia: Secondary | ICD-10-CM

## 2010-09-09 ENCOUNTER — Other Ambulatory Visit: Payer: BC Managed Care – PPO

## 2010-09-14 ENCOUNTER — Ambulatory Visit
Admission: RE | Admit: 2010-09-14 | Discharge: 2010-09-14 | Disposition: A | Discharge status: Home / Self Care | Payer: BC Managed Care – PPO | Source: Ambulatory Visit | Attending: Neurosurgery | Admitting: Neurosurgery

## 2010-09-14 DIAGNOSIS — M542 Cervicalgia: Secondary | ICD-10-CM

## 2010-10-13 LAB — BASIC METABOLIC PANEL
CO2: 27
CO2: 28
Chloride: 103
Chloride: 107
Creatinine, Ser: 0.88
GFR calc Af Amer: >60
GFR calc Af Amer: >60
GFR calc non Af Amer: >60
Potassium: 3.4 — ABNORMAL LOW
Sodium: 140

## 2010-10-13 LAB — URINALYSIS, ROUTINE W REFLEX MICROSCOPIC
Glucose, UA: NEGATIVE
Hgb urine dipstick: NEGATIVE
Specific Gravity, Urine: 1.029
Urobilinogen, UA: 0.2

## 2010-10-13 LAB — COMPREHENSIVE METABOLIC PANEL
ALT: 35
AST: 28
CO2: 30
Calcium: 9.5
Chloride: 103
GFR calc non Af Amer: >60
Glucose, Bld: 104 — ABNORMAL HIGH
Sodium: 140
Total Bilirubin: 0.7

## 2010-10-13 LAB — CBC
HCT: 34.6 — ABNORMAL LOW
Hemoglobin: 11.8 — ABNORMAL LOW
Hemoglobin: 14.6
MCHC: 34.2
MCHC: 34.4
MCV: 89.9
MCV: 90.6
RBC: 3.82 — ABNORMAL LOW
RBC: 4.74
WBC: 8.3
WBC: 8.6

## 2010-10-13 LAB — CROSSMATCH

## 2010-10-13 LAB — URINE CULTURE

## 2010-10-13 LAB — PROTIME-INR: INR: 0.9

## 2010-10-13 LAB — DIFFERENTIAL
Eosinophils Absolute: 0.1
Eosinophils Relative: 2
Lymphocytes Relative: 31
Lymphs Abs: 2.6
Monocytes Relative: 7

## 2010-10-13 LAB — ABO/RH: ABO/RH(D): O POS

## 2010-10-18 ENCOUNTER — Emergency Department (HOSPITAL_COMMUNITY)
Admission: EM | Admit: 2010-10-18 | Discharge: 2010-10-18 | Disposition: A | Discharge status: Home / Self Care | Payer: BC Managed Care – PPO | Attending: Emergency Medicine | Admitting: Emergency Medicine

## 2010-10-18 DIAGNOSIS — F17200 Nicotine dependence, unspecified, uncomplicated: Secondary | ICD-10-CM | POA: Insufficient documentation

## 2010-10-18 DIAGNOSIS — R42 Dizziness and giddiness: Secondary | ICD-10-CM | POA: Insufficient documentation

## 2010-10-20 LAB — POCT CARDIAC MARKERS
CKMB, poc: <1 — ABNORMAL LOW
Myoglobin, poc: 27.9
Operator id: 221061
Operator id: 284061
Troponin i, poc: 0.14 — ABNORMAL HIGH
Troponin i, poc: <0.05
Troponin i, poc: <0.05

## 2010-10-20 LAB — CARDIAC PANEL(CRET KIN+CKTOT+MB+TROPI)
CK, MB: 0.9
Relative Index: INVALID
Total CK: 45
Troponin I: <0.01

## 2010-10-20 LAB — BASIC METABOLIC PANEL
Chloride: 103
Creatinine, Ser: 0.86
GFR calc Af Amer: >60
Potassium: 3.5

## 2010-10-20 LAB — DIFFERENTIAL
Basophils Relative: 0
Eosinophils Absolute: 0.1
Eosinophils Absolute: 0.1
Eosinophils Relative: 2
Lymphocytes Relative: 33
Lymphs Abs: 2.8
Monocytes Relative: 7
Neutrophils Relative %: 45
Neutrophils Relative %: 58

## 2010-10-20 LAB — LIPID PANEL
Cholesterol: 173
HDL: 27 — ABNORMAL LOW
LDL Cholesterol: 94
Triglycerides: 261 — ABNORMAL HIGH

## 2010-10-20 LAB — CBC
HCT: 38.9 — ABNORMAL LOW
HCT: 44.1
MCHC: 34.6
MCV: 87.1
MCV: 89
Platelets: 185
RBC: 5.06
WBC: 8.6

## 2010-10-20 LAB — PROTIME-INR
INR: 1
Prothrombin Time: 12.8
Prothrombin Time: 13.4

## 2010-11-09 ENCOUNTER — Ambulatory Visit (HOSPITAL_COMMUNITY)
Admission: RE | Admit: 2010-11-09 | Discharge: 2010-11-09 | Disposition: A | Discharge status: Home / Self Care | Payer: BC Managed Care – PPO | Source: Ambulatory Visit | Attending: Internal Medicine | Admitting: Internal Medicine

## 2010-11-09 ENCOUNTER — Other Ambulatory Visit (HOSPITAL_COMMUNITY): Payer: Self-pay | Admitting: Internal Medicine

## 2010-11-09 DIAGNOSIS — R55 Syncope and collapse: Secondary | ICD-10-CM

## 2010-11-09 DIAGNOSIS — R413 Other amnesia: Secondary | ICD-10-CM

## 2010-11-09 DIAGNOSIS — J320 Chronic maxillary sinusitis: Secondary | ICD-10-CM | POA: Insufficient documentation

## 2011-01-25 ENCOUNTER — Other Ambulatory Visit: Payer: Self-pay | Admitting: Neurosurgery

## 2011-01-25 DIAGNOSIS — M47819 Spondylosis without myelopathy or radiculopathy, site unspecified: Secondary | ICD-10-CM

## 2011-01-30 ENCOUNTER — Ambulatory Visit
Admission: RE | Admit: 2011-01-30 | Discharge: 2011-01-30 | Disposition: A | Discharge status: Home / Self Care | Payer: BC Managed Care – PPO | Source: Ambulatory Visit | Attending: Neurosurgery | Admitting: Neurosurgery

## 2011-01-30 DIAGNOSIS — M47819 Spondylosis without myelopathy or radiculopathy, site unspecified: Secondary | ICD-10-CM

## 2011-02-16 ENCOUNTER — Ambulatory Visit
Admission: RE | Admit: 2011-02-16 | Discharge: 2011-02-16 | Disposition: A | Discharge status: Home / Self Care | Payer: BC Managed Care – PPO | Source: Ambulatory Visit | Attending: Neurosurgery | Admitting: Neurosurgery

## 2011-02-16 VITALS — BP 111/65 | HR 57

## 2011-02-16 DIAGNOSIS — M479 Spondylosis, unspecified: Secondary | ICD-10-CM

## 2011-02-16 MED ORDER — ONDANSETRON HCL 4 MG/2ML IJ SOLN: 4.0000 mg | Freq: Four times a day (QID) | INTRAMUSCULAR | Status: DC | PRN

## 2011-02-16 MED ORDER — DIAZEPAM 5 MG PO TABS
10.0000 mg | ORAL_TABLET | Freq: Once | ORAL | Status: AC
  Administered 2011-02-16: 10 mg via ORAL

## 2011-02-16 MED ORDER — IOHEXOL 300 MG/ML  SOLN
10.0000 mL | Freq: Once | INTRAMUSCULAR | Status: AC | PRN
  Administered 2011-02-16: 10 mL via INTRATHECAL

## 2011-02-16 NOTE — Progress Notes (Signed)
Pt has been off of lexapro for the last 2 days. Discharge instructions explained, consent signed and valium given dd

## 2011-03-13 ENCOUNTER — Encounter: Payer: Self-pay | Admitting: Internal Medicine

## 2011-03-14 ENCOUNTER — Encounter: Payer: Self-pay | Admitting: Gastroenterology

## 2011-03-14 ENCOUNTER — Ambulatory Visit (INDEPENDENT_AMBULATORY_CARE_PROVIDER_SITE_OTHER): Payer: BC Managed Care – PPO | Admitting: Gastroenterology

## 2011-03-14 VITALS — BP 120/70 | HR 80 | Temp 98.4°F | Ht 72.0 in | Wt 216.2 lb

## 2011-03-14 DIAGNOSIS — K625 Hemorrhage of anus and rectum: Secondary | ICD-10-CM

## 2011-03-14 DIAGNOSIS — R195 Other fecal abnormalities: Secondary | ICD-10-CM

## 2011-03-14 MED ORDER — PEG-KCL-NACL-NASULF-NA ASC-C 100 G PO SOLR: 1.0000 | Freq: Once | ORAL | Status: DC

## 2011-03-14 NOTE — Patient Instructions (Addendum)
Please complete stool studies and return to our office.   Start taking a probiotic daily. We currently do not have any samples, but you can use [employer], [facility], Align, Restora.   We have set you up for a colonoscopy with Dr. Jena Gauss in the near future. Further recommendations to follow once this is completed.

## 2011-03-14 NOTE — Progress Notes (Signed)
Referring Provider: Fagan, Roy, MD Primary Care Physician:  FAGAN,ROY, MD, MD Primary Gastroenterologist:  Dr. Rourk  Chief Complaint  Patient presents with  . Rectal Bleeding    HPI:   Mr. Petsch presents at the request of Dr. Fagan secondary to rectal bleeding. Last TCS in Sept 2005, likely small lipoma noted, normal rectum. Reports rectal bleeding, moderate volume, a few months ago. Rectal exam by Dr. Fagan was "real sore" at the time per pt. No further bleeding. No medication prescribed. No rectal discomfort currently. +gas. Loose stools currently. One to 2 per day. Past few days. No sick contacts. Reports rlq discomfort, onset with loose stools. Better after loose stools. Intermittent, lasting a few minutes. No N/V. No lack of appetite, no wt loss.  Nexium for GERD, no refractory symptoms. No dysphagia.     Past Medical History  Diagnosis Date  . Paroxysmal atrial fibrillation     a. flecainide therapy;  b. echo 6/09: EF normal, mild LVH;   c. Myoview 7/09: EF 59%, no ischemia  . HTN (hypertension)   . GERD (gastroesophageal reflux disease)   . COPD (chronic obstructive pulmonary disease)   . Depression with anxiety   . DJD (degenerative joint disease)   . Cervical disc disease     surgery planned for 05/25/10 (Dr. Kritzer)    Past Surgical History  Procedure Date  . Total shoulder arthroplasty   . Lumbar back surgery   . Wrist surgery   . Colonoscopy 10/07/03    normal rectum, submucosal nodule in mid-descending colon c/w lipoma  . Fused c4, c5, c6     anterior approach    Current Outpatient Prescriptions  Medication Sig Dispense Refill  . ALPRAZolam (XANAX) 1 MG tablet Take 1 mg by mouth at bedtime as needed.        . aspirin 325 MG tablet Take 325 mg by mouth daily.        . cefdinir (OMNICEF) 300 MG capsule Take 300 mg by mouth 2 (two) times daily.        . diltiazem (CARDIZEM CD) 240 MG 24 hr capsule Take 240 mg by mouth daily.        . escitalopram (LEXAPRO) 10  MG tablet Take 10 mg by mouth daily.        . esomeprazole (NEXIUM) 40 MG capsule Take 40 mg by mouth daily before breakfast.        . flecainide (TAMBOCOR) 100 MG tablet Take 75 mg by mouth 2 (two) times daily.       . gabapentin (NEURONTIN) 300 MG capsule Take 300 mg by mouth 3 (three) times daily.        . olmesartan-hydrochlorothiazide (BENICAR HCT) 40-25 MG per tablet Take 1 tablet by mouth daily.        . oxyCODONE-acetaminophen (PERCOCET) 5-325 MG per tablet Take 1 tablet by mouth every 4 (four) hours as needed.        . peg 3350 powder (MOVIPREP) 100 G SOLR Take 1 kit (100 g total) by mouth once. As directed Please purchase 1 Fleets enema to use with the prep  1 kit  0    Allergies as of 03/14/2011  . (No Known Allergies)    Family History  Problem Relation Age of Onset  . Cancer Mother   . Dementia Mother   . Heart attack Brother 45  . Colon cancer Neg Hx     History   Social History  . Marital Status: Married      Spouse Name: N/A    Number of Children: N/A  . Years of Education: N/A   Occupational History  . 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 Smoking status: Current Everyday Smoker -- 1.0 packs/day    Types: Cigarettes  . Smokeless tobacco: Never Used   Comment: 1 pack a day  . Alcohol Use: 1.0 oz/week    2 drink(s) per week     12 pack a week   . Drug Use: No  . Sexually Active: Not on file   Other Topics Concern  . Not on file   Social History Narrative  . No narrative on file    Review of Systems: Gen: Denies any fever, chills, loss of appetite, fatigue, weight loss. CV: Denies chest pain, heart palpitations, syncope, peripheral edema. Resp: Denies shortness of breath with rest, cough, wheezing GI: Denies dysphagia or odynophagia. Denies hematemesis, fecal incontinence, or jaundice.  GU : Denies urinary burning, urinary frequency,  urinary incontinence.  MS: Denies joint pain, muscle weakness, cramps, limited movement Derm: Denies rash, itching, dry skin Psych: Denies depression, anxiety, confusion or memory loss  Heme: Denies bruising, bleeding, and enlarged lymph nodes.  Physical Exam: BP 120/70  Pulse 80  Temp(Src) 98.4 F (36.9 C) (Temporal)  Ht 6' (1.829 m)  Wt 216 lb 3.2 oz (98.068 kg)  BMI 29.32 kg/m2 General:   Alert and oriented. Well-developed, well-nourished, pleasant and cooperative. Head:  Normocephalic and atraumatic. Eyes:  Conjunctiva pink, sclera clear, no icterus.   Conjunctiva pink. Ears:  Normal auditory acuity. Nose:  No deformity, discharge,  or lesions. Mouth:  No deformity or lesions, mucosa pink and moist.  Neck:  Supple, without mass or thyromegaly. Lungs:  Expiratory wheeze bilaterally, no distress Heart:  S1, S2 present without murmurs noted.  Abdomen:  +BS, soft, non-tender and non-distended. Without mass or HSM. No rebound or guarding. No hernias noted. Rectal:  Deferred  Msk:  Symmetrical without gross deformities. Normal posture. Pulses:  Normal pulses noted. Extremities:  Without clubbing or edema. Neurologic:  Alert and  oriented x4;  grossly normal neurologically. Skin:  Intact, warm and dry without significant lesions or rashes Cervical Nodes:  No significant cervical adenopathy. Psych:  Alert and cooperative. Normal mood and affect.   

## 2011-03-16 ENCOUNTER — Encounter: Payer: Self-pay | Admitting: Gastroenterology

## 2011-03-16 DIAGNOSIS — R195 Other fecal abnormalities: Secondary | ICD-10-CM | POA: Insufficient documentation

## 2011-03-16 DIAGNOSIS — K625 Hemorrhage of anus and rectum: Secondary | ICD-10-CM | POA: Insufficient documentation

## 2011-03-16 NOTE — Assessment & Plan Note (Signed)
61 year old male with recent hx of moderate amount of hematochezia, notes rectal discomfort at time of exam with Dr. Ouida Sills but none currently. Rectal bleeding has diminished, no pruritis. 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 No FH of colon cancer. At this time, likely due to benign source. However, due to remote hx of colonoscopy, we will update at this time.   Proceed with TCS with Dr. Jena Gauss in near future: the risks, benefits, and alternatives have been discussed with the patient in detail. The patient states understanding and desires to proceed.

## 2011-03-16 NOTE — Progress Notes (Signed)
Faxed to PCP

## 2011-03-16 NOTE — Assessment & Plan Note (Signed)
1-2 loose stools for several days, preceded by RLQ pain. No fever/chills, or other concerning factors noted. No sick contacts. Will obtain Cdiff PCR and stool culture. Proceed with TCS as planned.

## 2011-03-20 ENCOUNTER — Encounter (HOSPITAL_COMMUNITY): Payer: Self-pay | Admitting: Pharmacy Technician

## 2011-03-20 LAB — CLOSTRIDIUM DIFFICILE BY PCR: Toxigenic C. Difficile by PCR: NOT DETECTED

## 2011-03-21 LAB — STOOL CULTURE

## 2011-03-21 MED ORDER — SODIUM CHLORIDE 0.45 % IV SOLN
Freq: Once | INTRAVENOUS | Status: AC
  Administered 2011-03-22: 14:00:00 via INTRAVENOUS

## 2011-03-22 ENCOUNTER — Encounter (HOSPITAL_COMMUNITY)
Admission: RE | Disposition: A | Discharge status: Home / Self Care | Payer: Self-pay | Source: Ambulatory Visit | Attending: Internal Medicine

## 2011-03-22 ENCOUNTER — Ambulatory Visit (HOSPITAL_COMMUNITY)
Admission: RE | Admit: 2011-03-22 | Discharge: 2011-03-22 | Disposition: A | Discharge status: Home / Self Care | Payer: BC Managed Care – PPO | Source: Ambulatory Visit | Attending: Internal Medicine | Admitting: Internal Medicine

## 2011-03-22 ENCOUNTER — Encounter (HOSPITAL_COMMUNITY): Payer: Self-pay | Admitting: *Deleted

## 2011-03-22 ENCOUNTER — Ambulatory Visit: Admit: 2011-03-22 | Payer: Self-pay | Admitting: Internal Medicine

## 2011-03-22 DIAGNOSIS — K921 Melena: Secondary | ICD-10-CM | POA: Insufficient documentation

## 2011-03-22 DIAGNOSIS — J449 Chronic obstructive pulmonary disease, unspecified: Secondary | ICD-10-CM | POA: Insufficient documentation

## 2011-03-22 DIAGNOSIS — J4489 Other specified chronic obstructive pulmonary disease: Secondary | ICD-10-CM | POA: Insufficient documentation

## 2011-03-22 DIAGNOSIS — I10 Essential (primary) hypertension: Secondary | ICD-10-CM | POA: Insufficient documentation

## 2011-03-22 DIAGNOSIS — Z79899 Other long term (current) drug therapy: Secondary | ICD-10-CM | POA: Insufficient documentation

## 2011-03-22 DIAGNOSIS — D126 Benign neoplasm of colon, unspecified: Secondary | ICD-10-CM

## 2011-03-22 DIAGNOSIS — K5730 Diverticulosis of large intestine without perforation or abscess without bleeding: Secondary | ICD-10-CM | POA: Insufficient documentation

## 2011-03-22 DIAGNOSIS — K625 Hemorrhage of anus and rectum: Secondary | ICD-10-CM

## 2011-03-22 HISTORY — PX: COLONOSCOPY: SHX5424

## 2011-03-22 SURGERY — COLONOSCOPY
Anesthesia: Moderate Sedation

## 2011-03-22 MED ORDER — MIDAZOLAM HCL 5 MG/5ML IJ SOLN
INTRAMUSCULAR | Status: AC
  Filled 2011-03-22: qty 10

## 2011-03-22 MED ORDER — MIDAZOLAM HCL 5 MG/5ML IJ SOLN
INTRAMUSCULAR | Status: DC | PRN
  Administered 2011-03-22: 1 mg via INTRAVENOUS
  Administered 2011-03-22: 2 mg via INTRAVENOUS
  Administered 2011-03-22: 1 mg via INTRAVENOUS

## 2011-03-22 MED ORDER — PROMETHAZINE HCL 25 MG/ML IJ SOLN
INTRAMUSCULAR | Status: AC
  Filled 2011-03-22: qty 1

## 2011-03-22 MED ORDER — PROMETHAZINE HCL 25 MG/ML IJ SOLN
25.0000 mg | Freq: Once | INTRAMUSCULAR | Status: AC
  Administered 2011-03-22: 25 mg via INTRAVENOUS

## 2011-03-22 MED ORDER — SODIUM CHLORIDE 0.9 % IJ SOLN
INTRAMUSCULAR | Status: AC
  Filled 2011-03-22: qty 10

## 2011-03-22 MED ORDER — STERILE WATER FOR IRRIGATION IR SOLN
Status: DC | PRN
  Administered 2011-03-22: 14:00:00

## 2011-03-22 MED ORDER — MEPERIDINE HCL 100 MG/ML IJ SOLN
INTRAMUSCULAR | Status: DC | PRN
  Administered 2011-03-22: 50 mg via INTRAVENOUS
  Administered 2011-03-22: 25 mg via INTRAVENOUS

## 2011-03-22 MED ORDER — MEPERIDINE HCL 100 MG/ML IJ SOLN
INTRAMUSCULAR | Status: AC
  Filled 2011-03-22: qty 2

## 2011-03-22 NOTE — H&P (View-Only) (Signed)
Referring Provider: Carylon Perches, MD Primary Care Physician:  Carylon Perches, MD, MD Primary Gastroenterologist:  Dr. Jena Gauss  Chief Complaint  Patient presents with  . Rectal Bleeding    HPI:   [name] presents at the request of Dr. Ouida Sills secondary to rectal bleeding. Last TCS in Sept 2005, likely small lipoma noted, normal rectum. Reports rectal bleeding, moderate volume, a few months ago. Rectal exam by Dr. Ouida Sills was "real sore" at the time per pt. No further bleeding. No medication prescribed. No rectal discomfort currently. +gas. Loose stools currently. One to 2 per day. Past few days. No sick contacts. Reports rlq discomfort, onset with loose stools. Better after loose stools. Intermittent, lasting a few minutes. No N/V. No lack of appetite, no wt loss.  Nexium for GERD, no refractory symptoms. No dysphagia.     Past Medical History  Diagnosis Date  . Paroxysmal atrial fibrillation     a. flecainide therapy;  b. echo 6/09: EF normal, mild LVH;   c. Myoview 7/09: EF 59%, no ischemia  . HTN (hypertension)   . GERD (gastroesophageal reflux disease)   . COPD (chronic obstructive pulmonary disease)   . Depression with anxiety   . DJD (degenerative joint disease)   . Cervical disc disease     surgery planned for 05/25/10 (Dr. Gerlene Fee)    Past Surgical History  Procedure Date  . Total shoulder arthroplasty   . Lumbar back surgery   . Wrist surgery   . Colonoscopy 10/07/03    normal rectum, submucosal nodule in mid-descending colon c/w lipoma  . Fused c4, c5, c6     anterior approach    Current Outpatient Prescriptions  Medication Sig Dispense Refill  . ALPRAZolam (XANAX) 1 MG tablet Take 1 mg by mouth at bedtime as needed.        Marland Kitchen aspirin 325 MG tablet Take 325 mg by mouth daily.        . cefdinir (OMNICEF) 300 MG capsule Take 300 mg by mouth 2 (two) times daily.        Marland Kitchen diltiazem (CARDIZEM CD) 240 MG 24 hr capsule Take 240 mg by mouth daily.        Marland Kitchen escitalopram (LEXAPRO) 10  MG tablet Take 10 mg by mouth daily.        Marland Kitchen esomeprazole (NEXIUM) 40 MG capsule Take 40 mg by mouth daily before breakfast.        . flecainide (TAMBOCOR) 100 MG tablet Take 75 mg by mouth 2 (two) times daily.       Marland Kitchen gabapentin (NEURONTIN) 300 MG capsule Take 300 mg by mouth 3 (three) times daily.        Marland Kitchen olmesartan-hydrochlorothiazide (BENICAR HCT) 40-25 MG per tablet Take 1 tablet by mouth daily.        Marland Kitchen oxyCODONE-acetaminophen (PERCOCET) 5-325 MG per tablet Take 1 tablet by mouth every 4 (four) hours as needed.        . peg 3350 powder (MOVIPREP) 100 G SOLR Take 1 kit (100 g total) by mouth once. As directed Please purchase 1 Fleets enema to use with the prep  1 kit  0    Allergies as of 03/14/2011  . (No Known Allergies)    Family History  Problem Relation Age of Onset  . Cancer Mother   . Dementia Mother   . Heart attack Brother 45  . Colon cancer Neg Hx     History   Social History  . Marital Status: Married  Spouse Name: N/A    Number of Children: N/A  . Years of Education: N/A   Occupational History  . 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 Smoking status: Current Everyday Smoker -- 1.0 packs/day    Types: Cigarettes  . Smokeless tobacco: Never Used   Comment: 1 pack a day  . Alcohol Use: 1.0 oz/week    2 drink(s) per week     12 pack a week   . Drug Use: No  . Sexually Active: Not on file   Other Topics Concern  . Not on file   Social History Narrative  . No narrative on file    Review of Systems: Gen: Denies any fever, chills, loss of appetite, fatigue, weight loss. CV: Denies chest pain, heart palpitations, syncope, peripheral edema. Resp: Denies shortness of breath with rest, cough, wheezing GI: Denies dysphagia or odynophagia. Denies hematemesis, fecal incontinence, or jaundice.  GU : Denies urinary burning, urinary frequency,  urinary incontinence.  MS: Denies joint pain, muscle weakness, cramps, limited movement Derm: Denies rash, itching, dry skin Psych: Denies depression, anxiety, confusion or memory loss  Heme: Denies bruising, bleeding, and enlarged lymph nodes.  Physical Exam: BP 120/70  Pulse 80  Temp(Src) 98.4 F (36.9 C) (Temporal)  Ht 6' (1.829 m)  Wt 216 lb 3.2 oz (98.068 kg)  BMI 29.32 kg/m2 General:   Alert and oriented. Well-developed, well-nourished, pleasant and cooperative. Head:  Normocephalic and atraumatic. Eyes:  Conjunctiva pink, sclera clear, no icterus.   Conjunctiva pink. Ears:  Normal auditory acuity. Nose:  No deformity, discharge,  or lesions. Mouth:  No deformity or lesions, mucosa pink and moist.  Neck:  Supple, without mass or thyromegaly. Lungs:  Expiratory wheeze bilaterally, no distress Heart:  S1, S2 present without murmurs noted.  Abdomen:  +BS, soft, non-tender and non-distended. Without mass or HSM. No rebound or guarding. No hernias noted. Rectal:  Deferred  Msk:  Symmetrical without gross deformities. Normal posture. Pulses:  Normal pulses noted. Extremities:  Without clubbing or edema. Neurologic:  Alert and  oriented x4;  grossly normal neurologically. Skin:  Intact, warm and dry without significant lesions or rashes Cervical Nodes:  No significant cervical adenopathy. Psych:  Alert and cooperative. Normal mood and affect.

## 2011-03-22 NOTE — Op Note (Signed)
[facility] [address] [place]  [address]  COLONOSCOPY PROCEDURE REPORT  PATIENT:  Travis Booker, Travis Booker  MR#:  [id number] BIRTHDATE:  [birth year], 60 yrs. old  GENDER:  male ENDOSCOPIST:  R. Roetta Sessions, MD FACP [facility] REF. BY:          Dr. Carylon Perches PROCEDURE DATE:  03/22/2011 PROCEDURE:  INDICATIONS:  Hematochezia  INFORMED CONSENT:  The risks, benefits, alternatives and imponderables including but not limited to bleeding, perforation as well as the possibility of a missed lesion have been reviewed. The potential for biopsy, lesion removal, etc. have also been discussed.  Questions have been answered.  All parties agreeable. Please see the history and physical in the medical record for more information.  MEDICATIONS:  Versed 4 mg IV and Demerol 75 mg IV. Phenergan 25 mg IV to augment conscious sedation  DESCRIPTION OF PROCEDURE:  After a digital rectal exam was performed, the EC-[id number] ([id number]) colonoscope was advanced from the anus through the rectum and colon to the area of the cecum, ileocecal valve and appendiceal orifice.  The cecum was deeply intubated.  These structures were well-seen and photographed for the record.  From the level of the cecum and ileocecal valve, the scope was slowly and cautiously withdrawn.  The mucosal surfaces were carefully surveyed utilizing scope tip deflection to facilitate fold flattening as needed.  The scope was pulled down into the rectum where a thorough examination including retroflexion was performed. <<PROCEDUREIMAGES>>  FINDINGS:  Excellent prep. Rectum normal. Single 4 mm hyperplastic-appearing polyp in the mid sigmoid; also, scattered sigmoid diverticula; 1.5                         cm pale yellow submucosal nodule in the descending segment consistent with previously noted lipoma; remainder of colonic mucosa appeared normal.  THERAPEUTIC / DIAGNOSTIC MANEUVERS PERFORMED:  The sigmoid polyp was cold  biopsied/removed.  COMPLICATIONS:  None  CECAL WITHDRAWAL TIME:  9 minutes  IMPRESSION:    Colonic diverticulosis; single sigmoid polyp-removed as described above. I suspect benign and rectal bleeding the  RECOMMENDATIONS: Follow up on pathology. Begin a substantial fiber supplement with Metamucil 1 dose daily  ______________________________ R. Roetta Sessions, MD Caleen Essex  CC:  n. eSIGNED:   R. Roetta Sessions at 03/22/2011 02:40 PM  Earle Gell, [phone]

## 2011-03-22 NOTE — Interval H&P Note (Signed)
History and Physical Interval Note:  03/22/2011 2:11 PM  Travis Booker  has presented today for surgery, with the diagnosis of RECTAL BLEEDING  The various methods of treatment have been discussed with the patient and family. After consideration of risks, benefits and other options for treatment, the patient has consented to  Procedure(s) (LRB): COLONOSCOPY (N/A) as a surgical intervention .  The patients' history has been reviewed, patient examined, no change in status, stable for surgery.  I have reviewed the patients' chart and labs.  Questions were answered to the patient's satisfaction.     Eula Listen

## 2011-03-22 NOTE — Discharge Instructions (Signed)
  Colonoscopy Discharge Instructions  Read the instructions outlined below and refer to this sheet in the next few weeks. These discharge instructions provide you with general information on caring for yourself after you leave the hospital. Your doctor may also give you specific instructions. While your treatment has been planned according to the most current medical practices available, unavoidable complications occasionally occur. If you have any problems or questions after discharge, call Dr. Jena Gauss at [phone]. ACTIVITY  You may resume your regular activity, but move at a slower pace for the next 24 hours.   Take frequent rest periods for the next 24 hours.   Walking will help get rid of the air and reduce the bloated feeling in your belly (abdomen).   No driving for 24 hours (because of the medicine (anesthesia) used during the test).    Do not sign any important legal documents or operate any machinery for 24 hours (because of the anesthesia used during the test).  NUTRITION  Drink plenty of fluids.   You may resume your normal diet as instructed by your doctor.   Begin with a light meal and progress to your normal diet. Heavy or fried foods are harder to digest and may make you feel sick to your stomach (nauseated).   Avoid alcoholic beverages for 24 hours or as instructed.  MEDICATIONS  You may resume your normal medications unless your doctor tells you otherwise.  WHAT YOU CAN EXPECT TODAY  Some feelings of bloating in the abdomen.   Passage of more gas than usual.   Spotting of blood in your stool or on the toilet paper.  IF YOU HAD POLYPS REMOVED DURING THE COLONOSCOPY:  No aspirin products for 7 days or as instructed.   No alcohol for 7 days or as instructed.   Eat a soft diet for the next 24 hours.  FINDING OUT THE RESULTS OF YOUR TEST Not all test results are available during your visit. If your test results are not back during the visit, make an appointment  with your caregiver to find out the results. Do not assume everything is normal if you have not heard from your caregiver or the medical facility. It is important for you to follow up on all of your test results.  SEEK IMMEDIATE MEDICAL ATTENTION IF:  You have more than a spotting of blood in your stool.   Your belly is swollen (abdominal distention).   You are nauseated or vomiting.   You have a temperature over 101.   You have abdominal pain or discomfort that is severe or gets worse throughout the day.   Diverticulosis and polyp information provided to  Take Metamucil daily  Further recommendations to follow pending review of pathology report

## 2011-03-25 ENCOUNTER — Encounter: Payer: Self-pay | Admitting: Internal Medicine

## 2011-03-28 ENCOUNTER — Encounter (HOSPITAL_COMMUNITY): Payer: Self-pay | Admitting: Internal Medicine

## 2011-05-26 ENCOUNTER — Other Ambulatory Visit: Payer: Self-pay | Admitting: Cardiology

## 2011-11-14 ENCOUNTER — Encounter (HOSPITAL_COMMUNITY): Payer: Self-pay

## 2011-11-14 ENCOUNTER — Emergency Department (HOSPITAL_COMMUNITY): Payer: BC Managed Care – PPO

## 2011-11-14 ENCOUNTER — Emergency Department (HOSPITAL_COMMUNITY)
Admission: EM | Admit: 2011-11-14 | Discharge: 2011-11-14 | Disposition: A | Discharge status: Home / Self Care | Payer: BC Managed Care – PPO | Attending: Emergency Medicine | Admitting: Emergency Medicine

## 2011-11-14 DIAGNOSIS — Z7982 Long term (current) use of aspirin: Secondary | ICD-10-CM | POA: Insufficient documentation

## 2011-11-14 DIAGNOSIS — I10 Essential (primary) hypertension: Secondary | ICD-10-CM | POA: Insufficient documentation

## 2011-11-14 DIAGNOSIS — F17200 Nicotine dependence, unspecified, uncomplicated: Secondary | ICD-10-CM | POA: Insufficient documentation

## 2011-11-14 DIAGNOSIS — K219 Gastro-esophageal reflux disease without esophagitis: Secondary | ICD-10-CM | POA: Insufficient documentation

## 2011-11-14 DIAGNOSIS — M5030 Other cervical disc degeneration, unspecified cervical region: Secondary | ICD-10-CM | POA: Insufficient documentation

## 2011-11-14 DIAGNOSIS — Z8659 Personal history of other mental and behavioral disorders: Secondary | ICD-10-CM | POA: Insufficient documentation

## 2011-11-14 DIAGNOSIS — E876 Hypokalemia: Secondary | ICD-10-CM | POA: Insufficient documentation

## 2011-11-14 DIAGNOSIS — J40 Bronchitis, not specified as acute or chronic: Secondary | ICD-10-CM

## 2011-11-14 DIAGNOSIS — J449 Chronic obstructive pulmonary disease, unspecified: Secondary | ICD-10-CM | POA: Insufficient documentation

## 2011-11-14 DIAGNOSIS — Z79899 Other long term (current) drug therapy: Secondary | ICD-10-CM | POA: Insufficient documentation

## 2011-11-14 DIAGNOSIS — J4489 Other specified chronic obstructive pulmonary disease: Secondary | ICD-10-CM | POA: Insufficient documentation

## 2011-11-14 DIAGNOSIS — I4891 Unspecified atrial fibrillation: Secondary | ICD-10-CM | POA: Insufficient documentation

## 2011-11-14 DIAGNOSIS — Z8739 Personal history of other diseases of the musculoskeletal system and connective tissue: Secondary | ICD-10-CM | POA: Insufficient documentation

## 2011-11-14 LAB — URINALYSIS, ROUTINE W REFLEX MICROSCOPIC
Bilirubin Urine: NEGATIVE
Glucose, UA: NEGATIVE mg/dL
Specific Gravity, Urine: 1.01 (ref 1.005–1.030)
Urobilinogen, UA: 0.2 mg/dL (ref 0.0–1.0)

## 2011-11-14 LAB — BASIC METABOLIC PANEL
BUN: 10 mg/dL (ref 6–23)
GFR calc non Af Amer: >90 mL/min (ref >90)
Glucose, Bld: 107 mg/dL — ABNORMAL HIGH (ref 70–99)
Potassium: 3 mEq/L — ABNORMAL LOW (ref 3.5–5.1)

## 2011-11-14 LAB — CBC WITH DIFFERENTIAL/PLATELET
Eosinophils Absolute: 0.1 10*3/uL (ref 0.0–0.7)
Eosinophils Relative: 0 % (ref 0–5)
Hemoglobin: 14.3 g/dL (ref 13.0–17.0)
Lymphs Abs: 1.3 10*3/uL (ref 0.7–4.0)
MCH: 30.8 pg (ref 26.0–34.0)
MCV: 89 fL (ref 78.0–100.0)
Monocytes Relative: 8 % (ref 3–12)
Neutrophils Relative %: 84 % — ABNORMAL HIGH (ref 43–77)
RBC: 4.65 MIL/uL (ref 4.22–5.81)

## 2011-11-14 LAB — TROPONIN I: Troponin I: <0.3 ng/mL (ref <0.30)

## 2011-11-14 MED ORDER — POTASSIUM CHLORIDE ER 10 MEQ PO TBCR: 20.0000 meq | EXTENDED_RELEASE_TABLET | Freq: Two times a day (BID) | ORAL | Status: DC

## 2011-11-14 MED ORDER — IPRATROPIUM BROMIDE 0.02 % IN SOLN
0.5000 mg | Freq: Once | RESPIRATORY_TRACT | Status: AC
  Administered 2011-11-14: 0.5 mg via RESPIRATORY_TRACT
  Filled 2011-11-14: qty 2.5

## 2011-11-14 MED ORDER — HYDROCOD POLST-CHLORPHEN POLST 10-8 MG/5ML PO LQCR
5.0000 mL | Freq: Once | ORAL | Status: AC
  Administered 2011-11-14: 5 mL via ORAL
  Filled 2011-11-14: qty 5

## 2011-11-14 MED ORDER — HYDROCOD POLST-CHLORPHEN POLST 10-8 MG/5ML PO LQCR: 5.0000 mL | Freq: Two times a day (BID) | ORAL | Status: DC | PRN

## 2011-11-14 MED ORDER — ALBUTEROL SULFATE HFA 108 (90 BASE) MCG/ACT IN AERS
2.0000 | INHALATION_SPRAY | Freq: Once | RESPIRATORY_TRACT | Status: AC
  Administered 2011-11-14: 2 via RESPIRATORY_TRACT
  Filled 2011-11-14: qty 6.7

## 2011-11-14 MED ORDER — OXYCODONE-ACETAMINOPHEN 5-325 MG PO TABS
1.0000 | ORAL_TABLET | Freq: Once | ORAL | Status: AC
  Administered 2011-11-14: 1 via ORAL
  Filled 2011-11-14: qty 1

## 2011-11-14 MED ORDER — BENZONATATE 100 MG PO CAPS: 100.0000 mg | ORAL_CAPSULE | Freq: Three times a day (TID) | ORAL | Status: DC

## 2011-11-14 MED ORDER — BENZONATATE 100 MG PO CAPS
200.0000 mg | ORAL_CAPSULE | Freq: Once | ORAL | Status: AC
  Administered 2011-11-14: 200 mg via ORAL
  Filled 2011-11-14: qty 2

## 2011-11-14 MED ORDER — ALBUTEROL SULFATE (5 MG/ML) 0.5% IN NEBU
5.0000 mg | INHALATION_SOLUTION | Freq: Once | RESPIRATORY_TRACT | Status: AC
  Administered 2011-11-14: 5 mg via RESPIRATORY_TRACT
  Filled 2011-11-14: qty 1

## 2011-11-14 MED ORDER — AZITHROMYCIN 250 MG PO TABS: 500.0000 mg | ORAL_TABLET | Freq: Once | ORAL | Status: DC

## 2011-11-14 MED ORDER — DOXYCYCLINE HYCLATE 100 MG PO CAPS: 100.0000 mg | ORAL_CAPSULE | Freq: Two times a day (BID) | ORAL | Status: DC

## 2011-11-14 NOTE — ED Provider Notes (Addendum)
Medical screening examination/treatment/procedure(s) were conducted as a shared visit with non-physician practitioner(s) and myself.  I personally evaluated the patient during the encounter  Clinically was suspicious the patient had pneumonia chest x-rays negative. Will treat with the as if it's a bronchitis along with an antibiotic just to be complete. Patient will return for any newer worse symptoms.    Shelda Jakes, MD 11/14/11 1410  Shelda Jakes, MD 11/14/11 [phone]

## 2011-11-14 NOTE — ED Notes (Signed)
Paged RT.

## 2011-11-14 NOTE — ED Notes (Signed)
Pt reports dysuria that began last night.

## 2011-11-14 NOTE — ED Provider Notes (Signed)
History     CSN: [id number]  Arrival date & time 11/14/11  2956   First MD Initiated Contact with Patient 11/14/11 [phone]      Chief Complaint  Patient presents with  . Shortness of Breath    (Consider location/radiation/quality/duration/timing/severity/associated sxs/prior treatment) HPI Comments: Travis Booker presents with a one month history of nasal congestion,  Chronic sinus pain and pressure, and for the past week has had fever and chills with documented fever as hight as 103.5.  Cough  has been productive of yellow to green sputum.  Last night he developed increasing shortness of breath,  Cough, fever and fatigue.  He has known severe sinus disease and is anticipating surgery by Dr. Andrey Campanile in [place].  He does report having peripheral edema which is not new or worse with this illness.  He has taken a course of prednisone last week which slowed his symptoms briefly.  He also reports an episode of sharp right sided chest pain this am which was fleeting and now resolved.      Patient is a 61 y.o. male presenting with shortness of breath. The history is provided by the patient.  Shortness of Breath  Associated symptoms include chest pain, a fever, rhinorrhea, cough and shortness of breath. Pertinent negatives include no sore throat, no stridor and no wheezing.    Past Medical History  Diagnosis Date  . Paroxysmal atrial fibrillation     a. flecainide therapy;  b. echo 6/09: EF normal, mild LVH;   c. Myoview 7/09: EF 59%, no ischemia  . HTN (hypertension)   . GERD (gastroesophageal reflux disease)   . COPD (chronic obstructive pulmonary disease)   . Depression with anxiety   . DJD (degenerative joint disease)   . Cervical disc disease     surgery planned for 05/25/10 (Dr. Gerlene Fee)    Past Surgical History  Procedure Date  . Total shoulder arthroplasty   . Lumbar back surgery   . Wrist surgery   . Colonoscopy 10/07/03    normal rectum, submucosal nodule in mid-descending  colon c/w lipoma  . Fused c4, c5, c6     anterior approach  . Colonoscopy 03/22/2011    Procedure: COLONOSCOPY;  Surgeon: Corbin Ade, MD;  Location: [facility];  Service: Endoscopy;  Laterality: N/A;  2:30    Family History  Problem Relation Age of Onset  . Cancer Mother   . Dementia Mother   . Heart attack Brother 45  . Colon cancer Neg Hx     History  Substance Use Topics  . Smoking status: Current Every Day Smoker -- 1.0 packs/day    Types: Cigarettes  . Smokeless tobacco: Never Used   Comment: 1 pack a day  . Alcohol Use: 1.0 oz/week    2 drink(s) per week     12 pack a week       Review of Systems  Constitutional: Positive for fever and chills.  HENT: Positive for congestion and rhinorrhea. Negative for ear pain, sore throat and neck pain.   Eyes: Negative.   Respiratory: Positive for cough, chest tightness and shortness of breath. Negative for wheezing and stridor.   Cardiovascular: Positive for chest pain and leg swelling. Negative for palpitations.  Gastrointestinal: Negative for nausea and abdominal pain.  Genitourinary: Negative.   Musculoskeletal: Negative for joint swelling and arthralgias.  Skin: Negative.  Negative for rash and wound.  Neurological: Negative for dizziness, weakness, light-headedness, numbness and headaches.  Hematological: Negative.  Psychiatric/Behavioral: Negative.     Allergies  Review of patient's allergies indicates no known allergies.  Home Medications   Current Outpatient Rx  Name Route Sig Dispense Refill  . ALPRAZOLAM 1 MG PO TABS Oral Take 1 mg by mouth at bedtime as needed.      . ASPIRIN 325 MG PO TABS Oral Take 325 mg by mouth daily.      Marland Kitchen DILTIAZEM HCL ER COATED BEADS 240 MG PO CP24 Oral Take 240 mg by mouth daily.      Marland Kitchen ESCITALOPRAM OXALATE 10 MG PO TABS Oral Take 10 mg by mouth daily.      Marland Kitchen ESOMEPRAZOLE MAGNESIUM 40 MG PO CPDR Oral Take 40 mg by mouth daily before breakfast.      . OMEGA-3 FATTY ACIDS 1000  MG PO CAPS Oral Take 1 g by mouth daily.    Marland Kitchen FLECAINIDE ACETATE 100 MG PO TABS  TAKE 1 TABLET BY MOUTH TWICE A DAY 180 tablet 2  . OLMESARTAN MEDOXOMIL-HCTZ 40-25 MG PO TABS Oral Take 1 tablet by mouth daily.      . OXYCODONE-ACETAMINOPHEN 5-325 MG PO TABS Oral Take 1 tablet by mouth every 4 (four) hours as needed.      Marland Kitchen PEG-KCL-NACL-NASULF-NA ASC-C 100 G PO SOLR Oral Take 1 kit (100 g total) by mouth once. As directed Please purchase 1 Fleets enema to use with the prep 1 kit 0  . VIAGRA 100 MG PO TABS Oral Take 1 tablet by mouth once as needed.      BP 138/66  Pulse 98  Temp 100.5 F (38.1 C) (Oral)  Resp 22  Ht 6' (1.829 m)  Wt 200 lb (90.719 kg)  BMI 27.12 kg/m2  SpO2 92%  Physical Exam  Nursing note and vitals reviewed. Constitutional: He appears well-developed and well-nourished.  HENT:  Head: Normocephalic and atraumatic.  Eyes: Conjunctivae normal are normal.  Neck: Normal range of motion.  Cardiovascular: Normal rate, regular rhythm, normal heart sounds and intact distal pulses.   Pulmonary/Chest: Effort normal. He has no wheezes. He has rhonchi in the right lower field and the left lower field. He has no rales.  Abdominal: Soft. Bowel sounds are normal. There is no tenderness.  Musculoskeletal: Normal range of motion.  Neurological: He is alert.  Skin: Skin is warm and dry.  Psychiatric: He has a normal mood and affect.    ED Course  Procedures (including critical care time)  Labs Reviewed  BASIC METABOLIC PANEL - Abnormal; Notable for the following:    Potassium 3.0 (*)     Glucose, Bld 107 (*)     All other components within normal limits  CBC WITH DIFFERENTIAL  URINALYSIS, ROUTINE W REFLEX MICROSCOPIC   No results found.   No diagnosis found.  Results for orders placed during the hospital encounter of 11/14/11  CBC WITH DIFFERENTIAL      Component Value Range   WBC 15.0 (*) 4.0 - 10.5 K/uL   RBC 4.65  4.22 - 5.81 MIL/uL   Hemoglobin 14.3  13.0 -  17.0 g/dL   HCT 16.1  09.6 - 04.5 %   MCV 89.0  78.0 - 100.0 fL   MCH 30.8  26.0 - 34.0 pg   MCHC 34.5  30.0 - 36.0 g/dL   RDW 40.9  81.1 - 91.4 %   Platelets 172  150 - 400 K/uL   Neutrophils Relative 84 (*) 43 - 77 %   Neutro Abs 12.5 (*) 1.7 -  7.7 K/uL   Lymphocytes Relative 8 (*) 12 - 46 %   Lymphs Abs 1.3  0.7 - 4.0 K/uL   Monocytes Relative 8  3 - 12 %   Monocytes Absolute 1.2 (*) 0.1 - 1.0 K/uL   Eosinophils Relative 0  0 - 5 %   Eosinophils Absolute 0.1  0.0 - 0.7 K/uL   Basophils Relative 0  0 - 1 %   Basophils Absolute 0.0  0.0 - 0.1 K/uL  BASIC METABOLIC PANEL      Component Value Range   Sodium 137  135 - 145 mEq/L   Potassium 3.0 (*) 3.5 - 5.1 mEq/L   Chloride 97  96 - 112 mEq/L   CO2 29  19 - 32 mEq/L   Glucose, Bld 107 (*) 70 - 99 mg/dL   BUN 10  6 - 23 mg/dL   Creatinine, Ser 8.11  0.50 - 1.35 mg/dL   Calcium 8.6  8.4 - 91.4 mg/dL   GFR calc non Af Amer >90  >90 mL/min   GFR calc Af Amer >90  >90 mL/min  URINALYSIS, ROUTINE W REFLEX MICROSCOPIC      Component Value Range   Color, Urine YELLOW  YELLOW   APPearance CLEAR  CLEAR   Specific Gravity, Urine 1.010  1.005 - 1.030   pH 6.0  5.0 - 8.0   Glucose, UA NEGATIVE  NEGATIVE mg/dL   Hgb urine dipstick SMALL (*) NEGATIVE   Bilirubin Urine NEGATIVE  NEGATIVE   Ketones, ur NEGATIVE  NEGATIVE mg/dL   Protein, ur NEGATIVE  NEGATIVE mg/dL   Urobilinogen, UA 0.2  0.0 - 1.0 mg/dL   Nitrite NEGATIVE  NEGATIVE   Leukocytes, UA NEGATIVE  NEGATIVE  PRO B NATRIURETIC PEPTIDE      Component Value Range   Pro B Natriuretic peptide (BNP) 346.8 (*) 0 - 125 pg/mL  TROPONIN I      Component Value Range   Troponin I <0.30  <0.30 ng/mL  URINE MICROSCOPIC-ADD ON      Component Value Range   RBC / HPF 3-6  <3 RBC/hpf       Pt given albuterol/atrovent neb with resolution of wheezing,  Pt feels improved,  Although cough persists.   MDM  Pt prescribed doxycycline for bronchitis with fever/ possible early pneumonia.   Also given albuterol mdi ,  Tessalon, tussionex.  Rest, fluids.  Recheck by pcp if not improving,  Return here for any worsened sx.  Prescribed potassium.  Encouraged recheck by pcp.  The patient appears reasonably screened and/or stabilized for discharge and I doubt any other medical condition or other [facility] requiring further screening, evaluation, or treatment in the ED at this time prior to discharge.     Date: 11/14/2011  Rate: 84  Rhythm: normal sinus rhythm  QRS Axis: normal  Intervals: normal  ST/T Wave abnormalities: normal  Conduction Disutrbances:none  Narrative Interpretation:   Old EKG Reviewed: unchanged           Burgess Amor, PA 11/14/11 1342

## 2011-11-14 NOTE — ED Notes (Signed)
Pt reports has had cold symptoms for the past 1 1/2 months.  Reports got much worse yesterday.  C/O cough, SOB, fever up to 103.

## 2012-10-10 ENCOUNTER — Other Ambulatory Visit: Payer: Self-pay | Admitting: Cardiology

## 2012-10-10 NOTE — Telephone Encounter (Signed)
Please call patient and get her to make appt so we can fill medication

## 2012-10-14 NOTE — Telephone Encounter (Signed)
Have called patient   

## 2012-10-16 ENCOUNTER — Ambulatory Visit (INDEPENDENT_AMBULATORY_CARE_PROVIDER_SITE_OTHER): Payer: BC Managed Care – PPO | Admitting: Cardiology

## 2012-10-16 ENCOUNTER — Encounter: Payer: Self-pay | Admitting: Cardiology

## 2012-10-16 VITALS — BP 153/89 | HR 57 | Ht 72.0 in | Wt 221.0 lb

## 2012-10-16 DIAGNOSIS — I4891 Unspecified atrial fibrillation: Secondary | ICD-10-CM

## 2012-10-16 DIAGNOSIS — I10 Essential (primary) hypertension: Secondary | ICD-10-CM

## 2012-10-16 MED ORDER — FLECAINIDE ACETATE 100 MG PO TABS: 100.0000 mg | ORAL_TABLET | Freq: Two times a day (BID) | ORAL | Status: AC

## 2012-10-16 MED ORDER — TRIAMTERENE-HCTZ 37.5-25 MG PO TABS: 1.0000 | ORAL_TABLET | Freq: Every day | ORAL | Status: DC

## 2012-10-16 MED ORDER — DILTIAZEM HCL ER COATED BEADS 240 MG PO CP24: 240.0000 mg | ORAL_CAPSULE | Freq: Every day | ORAL | Status: DC

## 2012-10-16 NOTE — Patient Instructions (Addendum)
Your physician recommends that you schedule a follow-up appointment in: 1 month  Your physician has recommended you make the following change in your medication:  1. Maxzide 37.5-25 daily  Your physician recommends that you return for lab work in: 3 weeks BMP  We will send you lab slips in the mail when it is time to get labs done.

## 2012-10-16 NOTE — Progress Notes (Signed)
Clinical Summary Travis Booker is a 62 y.o.male 1. Afib - on flecanide and dilt, on ASA. CHADS2 score is 1.  - occas palps, approx every 1-2 weeks. Lasts just a 1-2 minutes. Otherwise no signigicant symptoms.   2. HTN - checks bp at home daily. BPs at home typically 150s/100s   Past Medical History  Diagnosis Date  . Paroxysmal atrial fibrillation     a. flecainide therapy;  b. echo 6/09: EF normal, mild LVH;   c. Myoview 7/09: EF 59%, no ischemia  . HTN (hypertension)   . GERD (gastroesophageal reflux disease)   . COPD (chronic obstructive pulmonary disease)   . Depression with anxiety   . DJD (degenerative joint disease)   . Cervical disc disease     surgery planned for 05/25/10 (Dr. Gerlene Fee)     No Known Allergies   Current Outpatient Prescriptions  Medication Sig Dispense Refill  . ALPRAZolam (XANAX) 1 MG tablet Take 1 mg by mouth at bedtime as needed. nerves      . aspirin 325 MG tablet Take 325 mg by mouth daily.        . benzonatate (TESSALON) 100 MG capsule Take 1 capsule (100 mg total) by mouth every 8 (eight) hours.  21 capsule  0  . diltiazem (CARDIZEM CD) 240 MG 24 hr capsule Take 240 mg by mouth daily.        Marland Kitchen escitalopram (LEXAPRO) 10 MG tablet Take 10 mg by mouth daily.        Marland Kitchen esomeprazole (NEXIUM) 40 MG capsule Take 40 mg by mouth daily before breakfast.        . fish oil-omega-3 fatty acids 1000 MG capsule Take 1 g by mouth daily.      . flecainide (TAMBOCOR) 100 MG tablet Take 100 mg by mouth 2 (two) times daily.      Marland Kitchen losartan (COZAAR) 100 MG tablet Take 100 mg by mouth daily.      Marland Kitchen oxyCODONE-acetaminophen (PERCOCET) 5-325 MG per tablet Take 1 tablet by mouth every 4 (four) hours as needed. pain      . VIAGRA 100 MG tablet Take 1 tablet by mouth once as needed. Erectile dysfunction       No current facility-administered medications for this visit.     Past Surgical History  Procedure Laterality Date  . Total shoulder arthroplasty    . Lumbar back  surgery    . Wrist surgery    . Colonoscopy  10/07/03    normal rectum, submucosal nodule in mid-descending colon c/w lipoma  . Fused c4, c5, c6      anterior approach  . Colonoscopy  03/22/2011    Procedure: COLONOSCOPY;  Surgeon: Corbin Ade, MD;  Location: [facility];  Service: Endoscopy;  Laterality: N/A;  2:30     No Known Allergies    Family History  Problem Relation Age of Onset  . Cancer Mother   . Dementia Mother   . Heart attack Brother 45  . Colon cancer Neg Hx      Social History Mr. Auxier reports that he has been smoking Cigarettes.  He has been smoking about 1.00 pack per day. He has never used smokeless tobacco. Mr. Corbo reports that he drinks about 1.0 ounces of alcohol per week.   Review of Systems 12 point ROS negative other than reported in HPI  Physical Examination Filed Vitals:   10/16/12 1155  BP: 153/89  Pulse: 57   Filed Weights  10/16/12 1155  Weight: 221 lb (100.245 kg)    Gen: resting comfortably, NAD HEENT: no scleral icterus, pupils equal round and reactive, no palptable cervical adenopathy CV: RRR, no m/r/g, no JVD Pulm: CTAB Abd: soft, NT, ND NABS, no hepatosplenomegaly Ext: warm, no edema.  Skin: warm, no rash Neuro: A&Ox3, no focal deficits    [facility] EKG 10/16/12: SR, normal axis, pr 190 ms, QTc 420, no ischemic changes    Assessment and Plan  1. Afib - no significant current symptoms. Continue flecanide and diltiazem. CHADS2 score is 1, we will continue ASA  2. HTN - bp above goal. Pt w/ some noted low potassium in the past, will start triamterene/HCTZ for blood pressure - check BMP in 3 weeks   Antoine Poche, M.D., F.A.C.C.

## 2012-11-04 ENCOUNTER — Encounter: Payer: Self-pay | Admitting: *Deleted

## 2012-11-04 ENCOUNTER — Other Ambulatory Visit: Payer: Self-pay | Admitting: *Deleted

## 2012-11-04 DIAGNOSIS — I4891 Unspecified atrial fibrillation: Secondary | ICD-10-CM

## 2012-11-04 DIAGNOSIS — I10 Essential (primary) hypertension: Secondary | ICD-10-CM

## 2012-11-27 ENCOUNTER — Encounter: Payer: Self-pay | Admitting: Cardiology

## 2012-11-27 ENCOUNTER — Ambulatory Visit (INDEPENDENT_AMBULATORY_CARE_PROVIDER_SITE_OTHER): Payer: BC Managed Care – PPO | Admitting: Cardiology

## 2012-11-27 VITALS — BP 136/82 | HR 62 | Ht 72.0 in | Wt 208.0 lb

## 2012-11-27 DIAGNOSIS — I4891 Unspecified atrial fibrillation: Secondary | ICD-10-CM

## 2012-11-27 DIAGNOSIS — R400 Somnolence: Secondary | ICD-10-CM

## 2012-11-27 DIAGNOSIS — G4710 Hypersomnia, unspecified: Secondary | ICD-10-CM

## 2012-11-27 DIAGNOSIS — I10 Essential (primary) hypertension: Secondary | ICD-10-CM

## 2012-11-27 DIAGNOSIS — Z23 Encounter for immunization: Secondary | ICD-10-CM

## 2012-11-27 LAB — BASIC METABOLIC PANEL
BUN: 12 mg/dL (ref 6–23)
CO2: 29 mEq/L (ref 19–32)
Chloride: 103 mEq/L (ref 96–112)
Glucose, Bld: 92 mg/dL (ref 70–99)
Potassium: 3.9 mEq/L (ref 3.5–5.3)

## 2012-11-27 NOTE — Progress Notes (Signed)
Clinical Summary Mr. Kiester is a 62 y.o.male seen today for follow up of the following medical problems  1. Afib  - on flecanide and dilt, on ASA. CHADS2 score is 1.  - denies any recent palpitations  2. HTN  - not checking at home - maxide started last visit, ordered BMET to be done after 3 weeks but does not appear it was done  3. Day time somnolence - patient reports for several years - reports history of snoring, his wife has told him she has witnessed apneic episodes - never had a sleep study before  Past Medical History  Diagnosis Date  . Paroxysmal atrial fibrillation     a. flecainide therapy;  b. echo 6/09: EF normal, mild LVH;   c. Myoview 7/09: EF 59%, no ischemia  . HTN (hypertension)   . GERD (gastroesophageal reflux disease)   . COPD (chronic obstructive pulmonary disease)   . Depression with anxiety   . DJD (degenerative joint disease)   . Cervical disc disease     surgery planned for 05/25/10 (Dr. Gerlene Fee)     No Known Allergies   Current Outpatient Prescriptions  Medication Sig Dispense Refill  . ALPRAZolam (XANAX) 1 MG tablet Take 1 mg by mouth at bedtime as needed. nerves      . aspirin 325 MG tablet Take 325 mg by mouth daily.        . benzonatate (TESSALON) 100 MG capsule Take 1 capsule (100 mg total) by mouth every 8 (eight) hours.  21 capsule  0  . diltiazem (CARDIZEM CD) 240 MG 24 hr capsule Take 1 capsule (240 mg total) by mouth daily.  90 capsule  2  . escitalopram (LEXAPRO) 10 MG tablet Take 10 mg by mouth daily.        Marland Kitchen esomeprazole (NEXIUM) 40 MG capsule Take 40 mg by mouth daily before breakfast.        . fish oil-omega-3 fatty acids 1000 MG capsule Take 1 g by mouth daily.      . flecainide (TAMBOCOR) 100 MG tablet Take 1 tablet (100 mg total) by mouth 2 (two) times daily.  90 tablet  2  . losartan (COZAAR) 100 MG tablet Take 100 mg by mouth daily.      Marland Kitchen oxyCODONE-acetaminophen (PERCOCET) 5-325 MG per tablet Take 1 tablet by mouth  every 4 (four) hours as needed. pain      . triamterene-hydrochlorothiazide (MAXZIDE-25) 37.5-25 MG per tablet Take 1 tablet by mouth daily.  90 tablet  2  . VIAGRA 100 MG tablet Take 1 tablet by mouth once as needed. Erectile dysfunction       No current facility-administered medications for this visit.     Past Surgical History  Procedure Laterality Date  . Total shoulder arthroplasty    . Lumbar back surgery    . Wrist surgery    . Colonoscopy  10/07/03    normal rectum, submucosal nodule in mid-descending colon c/w lipoma  . Fused c4, c5, c6      anterior approach  . Colonoscopy  03/22/2011    Procedure: COLONOSCOPY;  Surgeon: Corbin Ade, MD;  Location: [facility];  Service: Endoscopy;  Laterality: N/A;  2:30     No Known Allergies    Family History  Problem Relation Age of Onset  . Cancer Mother   . Dementia Mother   . Heart attack Brother 45  . Colon cancer Neg Hx  Social History Mr. Tomei reports that he has been smoking Cigarettes.  He has been smoking about 1.00 pack per day. He has never used smokeless tobacco. Mr. Longton reports that he drinks about 1.0 ounces of alcohol per week.   Review of Systems CONSTITUTIONAL: No weight loss, fever, chills, weakness or fatigue.  HEENT: Eyes: No visual loss, blurred vision, double vision or yellow sclerae.No hearing loss, sneezing, congestion, runny nose or sore throat.  SKIN: No rash or itching.  CARDIOVASCULAR: per HPI RESPIRATORY:per HPI.  GASTROINTESTINAL: No anorexia, nausea, vomiting or diarrhea. No abdominal pain or blood.  GENITOURINARY: No burning on urination, no polyuria NEUROLOGICAL: No headache, dizziness, syncope, paralysis, ataxia, numbness or tingling in the extremities. No change in bowel or bladder control.  MUSCULOSKELETAL: No muscle, back pain, joint pain or stiffness.  LYMPHATICS: No enlarged nodes. No history of splenectomy.  PSYCHIATRIC: No history of depression or anxiety.    ENDOCRINOLOGIC: No reports of sweating, cold or heat intolerance. No polyuria or polydipsia.  Marland Kitchen   Physical Examination p 61 bp 136/82 Wt 208 lbs BMI 28 Gen: resting comfortably, no acute distress HEENT: no scleral icterus, pupils equal round and reactive, no palptable cervical adenopathy,  CV: RRR, no m/r/g, no JVD, no carotid bruits Resp: Clear to auscultation bilaterally GI: abdomen is soft, non-tender, non-distended, normal bowel sounds, no hepatosplenomegaly MSK: extremities are warm, no edema.  Skin: warm, no rash Neuro:  no focal deficits Psych: appropriate affect   Diagn[facility] EKG 10/16/12: SR, normal axis, pr 190 ms, QTc 420, no ischemic changes     Assessment and Plan  1. Afib  - no significant current symptoms. Continue flecanide and diltiazem. CHADS2 score is 1, we will continue ASA    2. HTN  - bp improved since stating maxide last visit - he did not go and have his blood drawn after last visit, he will go today - I have asked him to keep a bp log and bring next visit.   3. Day time somnolence - several risk factors and symptoms suggestive of obstructive sleep apnea - refer for sleep study     Antoine Poche, M.D., F.A.C.C.

## 2012-11-27 NOTE — Patient Instructions (Signed)
Your physician recommends that you schedule a follow-up appointment in: 6 MONTHS  Your physician has recommended that you have a sleep study. This test records several body functions during sleep, including: brain activity, eye movement, oxygen and carbon dioxide blood levels, heart rate and rhythm, breathing rate and rhythm, the flow of air through your mouth and nose, snoring, body muscle movements, and chest and belly movement.  WE WILL CALL YOU WITH YOUR TEST RESULTS/INSTRUCTIONS/NEXT STEPS ONCE RECEIVED BY THE PROVIDER

## 2012-12-04 ENCOUNTER — Encounter: Payer: Self-pay | Admitting: *Deleted

## 2012-12-18 ENCOUNTER — Telehealth: Payer: Self-pay | Admitting: *Deleted

## 2012-12-18 NOTE — Telephone Encounter (Signed)
FYI pt cancelled his sleep study and wanted to call back after Christmas to reschedule

## 2012-12-23 NOTE — Telephone Encounter (Signed)
Thank you for update.   Regis Wiland MD 

## 2013-07-07 ENCOUNTER — Encounter: Payer: Self-pay | Admitting: Cardiology

## 2013-09-02 ENCOUNTER — Other Ambulatory Visit: Payer: Self-pay | Admitting: Cardiology

## 2013-09-10 ENCOUNTER — Telehealth: Payer: Self-pay | Admitting: Cardiology

## 2013-09-10 MED ORDER — TRIAMTERENE-HCTZ 37.5-25 MG PO TABS: 1.0000 | ORAL_TABLET | Freq: Every day | ORAL | Status: DC

## 2013-09-10 NOTE — Telephone Encounter (Signed)
Received fax refill request  Rx # [id number] Medication:  Triamterene-HCTZ 37.5 - 25 mg tab Qty 90 Sig:  Take one tablet by mouth daily Physician:  Harl Bowie

## 2013-09-18 ENCOUNTER — Inpatient Hospital Stay (HOSPITAL_COMMUNITY): Payer: Medicare Other

## 2013-09-18 ENCOUNTER — Encounter (HOSPITAL_COMMUNITY): Payer: Self-pay | Admitting: Unknown Physician Specialty

## 2013-09-18 ENCOUNTER — Inpatient Hospital Stay (HOSPITAL_COMMUNITY)
Admission: AD | Admit: 2013-09-18 | Discharge: 2013-09-24 | DRG: 194 | Disposition: A | Discharge status: Home Health | Payer: Medicare Other | Source: Ambulatory Visit | Attending: Internal Medicine | Admitting: Internal Medicine

## 2013-09-18 ENCOUNTER — Other Ambulatory Visit: Payer: Self-pay | Admitting: Internal Medicine

## 2013-09-18 DIAGNOSIS — F411 Generalized anxiety disorder: Secondary | ICD-10-CM | POA: Diagnosis present

## 2013-09-18 DIAGNOSIS — Z8249 Family history of ischemic heart disease and other diseases of the circulatory system: Secondary | ICD-10-CM | POA: Diagnosis not present

## 2013-09-18 DIAGNOSIS — J441 Chronic obstructive pulmonary disease with (acute) exacerbation: Secondary | ICD-10-CM | POA: Diagnosis present

## 2013-09-18 DIAGNOSIS — J189 Pneumonia, unspecified organism: Principal | ICD-10-CM | POA: Diagnosis present

## 2013-09-18 DIAGNOSIS — K219 Gastro-esophageal reflux disease without esophagitis: Secondary | ICD-10-CM | POA: Diagnosis present

## 2013-09-18 DIAGNOSIS — I4891 Unspecified atrial fibrillation: Secondary | ICD-10-CM | POA: Diagnosis present

## 2013-09-18 DIAGNOSIS — Z87891 Personal history of nicotine dependence: Secondary | ICD-10-CM | POA: Diagnosis not present

## 2013-09-18 DIAGNOSIS — M1990 Unspecified osteoarthritis, unspecified site: Secondary | ICD-10-CM | POA: Diagnosis present

## 2013-09-18 DIAGNOSIS — E876 Hypokalemia: Secondary | ICD-10-CM | POA: Diagnosis present

## 2013-09-18 DIAGNOSIS — R0902 Hypoxemia: Secondary | ICD-10-CM | POA: Diagnosis present

## 2013-09-18 DIAGNOSIS — I10 Essential (primary) hypertension: Secondary | ICD-10-CM | POA: Diagnosis present

## 2013-09-18 DIAGNOSIS — Z23 Encounter for immunization: Secondary | ICD-10-CM | POA: Diagnosis not present

## 2013-09-18 DIAGNOSIS — G894 Chronic pain syndrome: Secondary | ICD-10-CM | POA: Diagnosis present

## 2013-09-18 DIAGNOSIS — J449 Chronic obstructive pulmonary disease, unspecified: Secondary | ICD-10-CM

## 2013-09-18 DIAGNOSIS — Z7982 Long term (current) use of aspirin: Secondary | ICD-10-CM | POA: Diagnosis not present

## 2013-09-18 DIAGNOSIS — R0602 Shortness of breath: Secondary | ICD-10-CM | POA: Diagnosis present

## 2013-09-18 LAB — COMPREHENSIVE METABOLIC PANEL
ALK PHOS: 60 U/L (ref 39–117)
ALT: 14 U/L (ref 0–53)
ANION GAP: 16 — AB (ref 5–15)
AST: 36 U/L (ref 0–37)
Albumin: 4 g/dL (ref 3.5–5.2)
BILIRUBIN TOTAL: 0.8 mg/dL (ref 0.3–1.2)
BUN: 12 mg/dL (ref 6–23)
CHLORIDE: 96 meq/L (ref 96–112)
CO2: 26 meq/L (ref 19–32)
Calcium: 8.9 mg/dL (ref 8.4–10.5)
Creatinine, Ser: 0.92 mg/dL (ref 0.50–1.35)
GFR calc Af Amer: >90 mL/min (ref >90)
GFR calc non Af Amer: 89 mL/min — ABNORMAL LOW (ref >90)
Glucose, Bld: 113 mg/dL — ABNORMAL HIGH (ref 70–99)
Potassium: 3.7 mEq/L (ref 3.7–5.3)
Sodium: 138 mEq/L (ref 137–147)
Total Protein: 7.8 g/dL (ref 6.0–8.3)

## 2013-09-18 LAB — CBC WITH DIFFERENTIAL/PLATELET
BASOS PCT: 0 % (ref 0–1)
Basophils Absolute: 0 10*3/uL (ref 0.0–0.1)
Eosinophils Absolute: 0 10*3/uL (ref 0.0–0.7)
Eosinophils Relative: 0 % (ref 0–5)
HEMATOCRIT: 44.1 % (ref 39.0–52.0)
Hemoglobin: 15.2 g/dL (ref 13.0–17.0)
LYMPHS PCT: 7 % — AB (ref 12–46)
Lymphs Abs: 1.3 10*3/uL (ref 0.7–4.0)
MCH: 32 pg (ref 26.0–34.0)
MCHC: 34.5 g/dL (ref 30.0–36.0)
MCV: 92.8 fL (ref 78.0–100.0)
MONOS PCT: 10 % (ref 3–12)
Monocytes Absolute: 1.6 10*3/uL — ABNORMAL HIGH (ref 0.1–1.0)
NEUTROS ABS: 14.2 10*3/uL — AB (ref 1.7–7.7)
Neutrophils Relative %: 83 % — ABNORMAL HIGH (ref 43–77)
Platelets: 186 10*3/uL (ref 150–400)
RBC: 4.75 MIL/uL (ref 4.22–5.81)
RDW: 12.7 % (ref 11.5–15.5)
WBC: 17.1 10*3/uL — ABNORMAL HIGH (ref 4.0–10.5)

## 2013-09-18 LAB — MRSA PCR SCREENING: MRSA BY PCR: NEGATIVE

## 2013-09-18 MED ORDER — OXYCODONE HCL 5 MG PO TABS
5.0000 mg | ORAL_TABLET | ORAL | Status: DC | PRN
  Administered 2013-09-18 – 2013-09-24 (×27): 5 mg via ORAL
  Filled 2013-09-18 (×27): qty 1

## 2013-09-18 MED ORDER — FLECAINIDE ACETATE 50 MG PO TABS
75.0000 mg | ORAL_TABLET | Freq: Two times a day (BID) | ORAL | Status: DC
  Administered 2013-09-18 – 2013-09-24 (×12): 75 mg via ORAL
  Filled 2013-09-18 (×12): qty 2

## 2013-09-18 MED ORDER — ALPRAZOLAM 1 MG PO TABS
1.0000 mg | ORAL_TABLET | Freq: Three times a day (TID) | ORAL | Status: DC
  Administered 2013-09-18 – 2013-09-24 (×18): 1 mg via ORAL
  Filled 2013-09-18 (×2): qty 1
  Filled 2013-09-18: qty 2
  Filled 2013-09-18: qty 1
  Filled 2013-09-18: qty 2
  Filled 2013-09-18 (×3): qty 1
  Filled 2013-09-18: qty 2
  Filled 2013-09-18 (×2): qty 1
  Filled 2013-09-18 (×2): qty 2
  Filled 2013-09-18: qty 1
  Filled 2013-09-18: qty 2
  Filled 2013-09-18 (×3): qty 1

## 2013-09-18 MED ORDER — ASPIRIN 81 MG PO CHEW
324.0000 mg | CHEWABLE_TABLET | Freq: Every day | ORAL | Status: DC
  Administered 2013-09-18 – 2013-09-24 (×7): 324 mg via ORAL
  Filled 2013-09-18 (×7): qty 4

## 2013-09-18 MED ORDER — PNEUMOCOCCAL VAC POLYVALENT 25 MCG/0.5ML IJ INJ: 0.5000 mL | INJECTION | INTRAMUSCULAR | Status: DC | PRN

## 2013-09-18 MED ORDER — ALUM & MAG HYDROXIDE-SIMETH 200-200-20 MG/5ML PO SUSP: 30.0000 mL | Freq: Four times a day (QID) | ORAL | Status: DC | PRN

## 2013-09-18 MED ORDER — ESCITALOPRAM OXALATE 10 MG PO TABS
10.0000 mg | ORAL_TABLET | Freq: Every day | ORAL | Status: DC
  Administered 2013-09-18 – 2013-09-24 (×7): 10 mg via ORAL
  Filled 2013-09-18 (×7): qty 1

## 2013-09-18 MED ORDER — DILTIAZEM HCL ER COATED BEADS 240 MG PO CP24
240.0000 mg | ORAL_CAPSULE | Freq: Every day | ORAL | Status: DC
  Administered 2013-09-19 – 2013-09-24 (×6): 240 mg via ORAL
  Filled 2013-09-18 (×6): qty 1

## 2013-09-18 MED ORDER — DILTIAZEM HCL 100 MG IV SOLR
5.0000 mg/h | INTRAVENOUS | Status: DC
  Administered 2013-09-18: 5 mg/h via INTRAVENOUS

## 2013-09-18 MED ORDER — DEXTROSE 5 % IV SOLN
INTRAVENOUS | Status: AC
  Filled 2013-09-18: qty 10

## 2013-09-18 MED ORDER — DEXTROSE 5 % IV SOLN
1.0000 g | INTRAVENOUS | Status: DC
  Administered 2013-09-18 – 2013-09-23 (×6): 1 g via INTRAVENOUS
  Filled 2013-09-18 (×7): qty 10

## 2013-09-18 MED ORDER — FLECAINIDE ACETATE 100 MG PO TABS
ORAL_TABLET | ORAL | Status: AC
  Filled 2013-09-18: qty 1

## 2013-09-18 MED ORDER — IPRATROPIUM BROMIDE 0.02 % IN SOLN
RESPIRATORY_TRACT | Status: AC
  Administered 2013-09-18: 0.5 mg
  Filled 2013-09-18: qty 2.5

## 2013-09-18 MED ORDER — ACETAMINOPHEN 325 MG PO TABS
650.0000 mg | ORAL_TABLET | Freq: Four times a day (QID) | ORAL | Status: DC | PRN
  Administered 2013-09-19: 650 mg via ORAL
  Filled 2013-09-18: qty 2

## 2013-09-18 MED ORDER — ONDANSETRON HCL 4 MG/2ML IJ SOLN: 4.0000 mg | Freq: Four times a day (QID) | INTRAMUSCULAR | Status: DC | PRN

## 2013-09-18 MED ORDER — PANTOPRAZOLE SODIUM 40 MG PO TBEC
40.0000 mg | DELAYED_RELEASE_TABLET | Freq: Every day | ORAL | Status: DC
  Administered 2013-09-18 – 2013-09-24 (×7): 40 mg via ORAL
  Filled 2013-09-18 (×7): qty 1

## 2013-09-18 MED ORDER — SODIUM CHLORIDE 0.9 % IV SOLN
INTRAVENOUS | Status: DC
  Administered 2013-09-18 – 2013-09-19 (×3): via INTRAVENOUS

## 2013-09-18 MED ORDER — LEVALBUTEROL HCL 0.63 MG/3ML IN NEBU
0.6300 mg | INHALATION_SOLUTION | RESPIRATORY_TRACT | Status: DC
  Administered 2013-09-18 – 2013-09-22 (×20): 0.63 mg via RESPIRATORY_TRACT
  Filled 2013-09-18 (×21): qty 3

## 2013-09-18 MED ORDER — ACETAMINOPHEN 650 MG RE SUPP: 650.0000 mg | Freq: Four times a day (QID) | RECTAL | Status: DC | PRN

## 2013-09-18 MED ORDER — ONDANSETRON HCL 4 MG PO TABS: 4.0000 mg | ORAL_TABLET | Freq: Four times a day (QID) | ORAL | Status: DC | PRN

## 2013-09-18 MED ORDER — ALBUTEROL SULFATE (2.5 MG/3ML) 0.083% IN NEBU
INHALATION_SOLUTION | RESPIRATORY_TRACT | Status: AC
  Administered 2013-09-18: 2.5 mg
  Filled 2013-09-18: qty 3

## 2013-09-18 MED ORDER — ENOXAPARIN SODIUM 40 MG/0.4ML ~~LOC~~ SOLN
40.0000 mg | SUBCUTANEOUS | Status: DC
  Administered 2013-09-18 – 2013-09-23 (×6): 40 mg via SUBCUTANEOUS
  Filled 2013-09-18 (×6): qty 0.4

## 2013-09-18 MED ORDER — METHYLPREDNISOLONE SODIUM SUCC 125 MG IJ SOLR
125.0000 mg | Freq: Four times a day (QID) | INTRAMUSCULAR | Status: DC
  Administered 2013-09-18 – 2013-09-21 (×11): 125 mg via INTRAVENOUS
  Filled 2013-09-18 (×11): qty 2

## 2013-09-18 MED ORDER — IPRATROPIUM BROMIDE 0.02 % IN SOLN
0.5000 mg | RESPIRATORY_TRACT | Status: DC
  Administered 2013-09-18 – 2013-09-22 (×20): 0.5 mg via RESPIRATORY_TRACT
  Filled 2013-09-18 (×21): qty 2.5

## 2013-09-18 MED ORDER — IPRATROPIUM-ALBUTEROL 0.5-2.5 (3) MG/3ML IN SOLN: 3.0000 mL | RESPIRATORY_TRACT | Status: DC

## 2013-09-18 NOTE — Progress Notes (Signed)
Pt is in AFIB w/ RVR, HR 126-149. RT changed duonebs to xopenex and atrovent.

## 2013-09-18 NOTE — Progress Notes (Signed)
Patient transported to SDU and cardizem drip started. Report given to [facility].

## 2013-09-18 NOTE — Progress Notes (Signed)
Patient arrived to floor. Alert oriented and ambulatory. Very short of breath and heart rate in the 140's. EKG showed Afib with RVR 120.Telemetry going up to 140 frequently. Dr. Willey Blade and Dr. Anastasio Champion paged.

## 2013-09-18 NOTE — Progress Notes (Signed)
Patients EKG and heart rate between 126-140 discussed with Dr. Anastasio Champion. Will transfer to SDU.

## 2013-09-19 ENCOUNTER — Encounter (HOSPITAL_COMMUNITY): Payer: Self-pay

## 2013-09-19 MED ORDER — HYDROCODONE-HOMATROPINE 5-1.5 MG/5ML PO SYRP
5.0000 mL | ORAL_SOLUTION | ORAL | Status: DC | PRN
  Administered 2013-09-19 – 2013-09-24 (×22): 5 mL via ORAL
  Filled 2013-09-19 (×23): qty 5

## 2013-09-19 NOTE — Progress Notes (Signed)
UR chart review completed.  

## 2013-09-19 NOTE — H&P (Signed)
PCP:   Asencion Noble, MD   Chief Complaint:  Difficulty breathing  HPI: This patient is a 63 year old white male who presented to the office with increasing difficulty breathing over the past several days. Has been coughing with yellow sputum production. He feels discomfort in his chest. His temperature in the office was 99.2 but he states his temperature the night before home was 104.2. He was hypoxic with an O2 saturation of 85% and was tachycardic in the 120 range. He has a long-term smoking history. He complained of wheezing and significant dyspnea with exertion.    Past Medical History  Diagnosis Date  . Paroxysmal atrial fibrillation     a. flecainide therapy;  b. echo 6/09: EF normal, mild LVH;   c. Myoview 7/09: EF 59%, no ischemia  . HTN (hypertension)   . GERD (gastroesophageal reflux disease)   . COPD (chronic obstructive pulmonary disease)   . Depression with anxiety   . DJD (degenerative joint disease)   . Cervical disc disease     surgery planned for 05/25/10 (Dr. Hal Neer)     Past Surgical History  Procedure Laterality Date  . Total shoulder arthroplasty    . Lumbar back surgery    . Wrist surgery    . Colonoscopy  10/07/03    normal rectum, submucosal nodule in mid-descending colon c/w lipoma  . Fused c4, c5, c6      anterior approach  . Colonoscopy  03/22/2011    Procedure: COLONOSCOPY;  Surgeon: Daneil Dolin, MD;  Location: [facility];  Service: Endoscopy;  Laterality: N/A;  2:30  . Cholecystectomy    . Appendectomy       Prior to Admission medications   Medication Sig Start Date End Date Taking? Authorizing Provider  ALPRAZolam Duanne Moron) 1 MG tablet Take 1 mg by mouth at bedtime as needed. nerves   Yes Historical Provider, MD  escitalopram (LEXAPRO) 10 MG tablet Take 10 mg by mouth daily.     Yes Historical Provider, MD  oxyCODONE-acetaminophen (PERCOCET) 5-325 MG per tablet Take 1 tablet by mouth every 4 (four) hours as needed. pain    Yes Historical Provider, MD  aspirin 325 MG tablet Take 325 mg by mouth daily.      Historical Provider, MD  benzonatate (TESSALON) 100 MG capsule Take 1 capsule (100 mg total) by mouth every 8 (eight) hours. 11/14/11   Evalee Jefferson, PA-C  diltiazem (CARDIZEM CD) 240 MG 24 hr capsule TAKE 1 CAPSULE (240 MG TOTAL) BY MOUTH DAILY. 09/02/13   Arnoldo Lenis, MD  fish oil-omega-3 fatty acids 1000 MG capsule Take 1 g by mouth daily.    Historical Provider, MD  flecainide (TAMBOCOR) 100 MG tablet Take 1 tablet (100 mg total) by mouth 2 (two) times daily. 10/16/12   Arnoldo Lenis, MD  losartan (COZAAR) 100 MG tablet Take 100 mg by mouth daily.    Historical Provider, MD  omeprazole (PRILOSEC) 40 MG capsule Take 40 mg by mouth daily.    Historical Provider, MD  triamterene-hydrochlorothiazide (MAXZIDE-25) 37.5-25 MG per tablet Take 1 tablet by mouth daily. 09/10/13   Arnoldo Lenis, MD  VIAGRA 100 MG tablet Take 1 tablet by mouth once as needed. Erectile dysfunction 01/23/11   Historical Provider, MD      Allergies:  No Known Allergies   Social History:  reports that he has quit smoking. He  has never used smokeless tobacco. He reports that he drinks about one ounce of alcohol per week. He reports that he does not use illicit drugs.  Family History  Problem Relation Age of Onset  . Cancer Mother   . Dementia Mother   . Heart attack Brother 3  . Colon cancer Neg Hx     Review of Systems:  '@NORROS' @  Physical Exam: Blood pressure 95/60, pulse 63, temperature 98.1 F (36.7 C), temperature source Axillary, resp. rate 16, height 6' (1.829 m), weight 216 lb 11.4 oz (98.3 kg), SpO2 93.00%. Gen.: Alert but dyspneic and tachypneic appearing. HEENT: Nose reveals clear secretions. TMs and oropharynx are unremarkable. Neck is supple with no JVD or thyromegaly. Lungs bilateral wheezes. Heart tachycardic and irregular. Abdomen soft and nontender with no palpable organomegaly. Extremities revealed  no cyanosis, clubbing or edema. Neuro exam revealed no focal weakness. Lymph nodes reveal no enlargement. Skin warm and dry.  Labs on Admission:  Results for orders placed during the hospital encounter of 09/18/13 (from the past 48 hour(s))  CBC WITH DIFFERENTIAL     Status: Abnormal   Collection Time    09/18/13  5:52 PM      Result Value Ref Range   WBC 17.1 (*) 4.0 - 10.5 K/uL   RBC 4.75  4.22 - 5.81 MIL/uL   Hemoglobin 15.2  13.0 - 17.0 g/dL   HCT 44.1  39.0 - 52.0 %   MCV 92.8  78.0 - 100.0 fL   MCH 32.0  26.0 - 34.0 pg   MCHC 34.5  30.0 - 36.0 g/dL   RDW 12.7  11.5 - 15.5 %   Platelets 186  150 - 400 K/uL   Neutrophils Relative % 83 (*) 43 - 77 %   Neutro Abs 14.2 (*) 1.7 - 7.7 K/uL   Lymphocytes Relative 7 (*) 12 - 46 %   Lymphs Abs 1.3  0.7 - 4.0 K/uL   Monocytes Relative 10  3 - 12 %   Monocytes Absolute 1.6 (*) 0.1 - 1.0 K/uL   Eosinophils Relative 0  0 - 5 %   Eosinophils Absolute 0.0  0.0 - 0.7 K/uL   Basophils Relative 0  0 - 1 %   Basophils Absolute 0.0  0.0 - 0.1 K/uL  COMPREHENSIVE METABOLIC PANEL     Status: Abnormal   Collection Time    09/18/13  5:52 PM      Result Value Ref Range   Sodium 138  137 - 147 mEq/L   Potassium 3.7  3.7 - 5.3 mEq/L   Chloride 96  96 - 112 mEq/L   CO2 26  19 - 32 mEq/L   Glucose, Bld 113 (*) 70 - 99 mg/dL   BUN 12  6 - 23 mg/dL   Creatinine, Ser 0.92  0.50 - 1.35 mg/dL   Calcium 8.9  8.4 - 10.5 mg/dL   Total Protein 7.8  6.0 - 8.3 g/dL   Albumin 4.0  3.5 - 5.2 g/dL   AST 36  0 - 37 U/L   ALT 14  0 - 53 U/L   Alkaline Phosphatase 60  39 - 117 U/L   Total Bilirubin 0.8  0.3 - 1.2 mg/dL   GFR calc non Af Amer 89 (*) >90 mL/min   GFR calc Af Amer >90  >90 mL/min   Comment: (NOTE)     The eGFR has been calculated using the CKD EPI equation.     This calculation has  not been validated in all clinical situations.     eGFR's persistently <90 mL/min signify possible Chronic Kidney     Disease.   Anion gap 16 (*) 5 - 15  MRSA  PCR SCREENING     Status: None   Collection Time    09/18/13  6:28 PM      Result Value Ref Range   MRSA by PCR NEGATIVE  NEGATIVE   Comment:            The GeneXpert MRSA Assay (FDA     approved for NASAL specimens     only), is one component of a     comprehensive MRSA colonization     surveillance program. It is not     intended to diagnose MRSA     infection nor to guide or     monitor treatment for     MRSA infections.    Radiological Exams on Admission: Dg Chest 1 View  09/18/2013   CLINICAL DATA:  Cough and difficulty breathing  EXAM: CHEST - 1 VIEW  COMPARISON:  November 14, 2011  FINDINGS: There is underlying emphysematous change. There is no edema or consolidation. The heart size and pulmonary vascularity are within normal limits. No adenopathy. There is a total shoulder replacement on the left.  IMPRESSION: There is a degree of underlying emphysematous change. No edema or consolidation.   Electronically Signed   By: Lowella Grip M.D.   On: 09/18/2013 18:56    Assessment/Plan  #1. COPD exacerbation. He was felt to likely have an underlying pneumonia upon examination at the office but on further evaluation at the hospital his chest x-ray reveals no infiltrate. His white count 17.1. His had significant wheezing and associated hypoxia consistent with acute respiratory failure. He will be treated with Rocephin, Solu-Medrol, nebulizer treatments and supplemental oxygen. #2. PAF. He was started on an IV diltiazem drip. Continue flecainide. #3. Hypertension. Satisfactorily controlled. #4. Chronic back pain. Continue oxycodone as needed. #5. Anxiety. Continue Xanax. Continue Lexapro. Active Problems:   CAP (community acquired pneumonia)    Travis Booker    09/19/2013

## 2013-09-19 NOTE — Progress Notes (Signed)
Subjective: Travis Booker is feeling better this morning. He continues to cough with limited sputum production. He converted to sinus rhythm earlier this morning. He has not had recurrent fever.  Objective: Vital signs in last 24 hours: Filed Vitals:   09/19/13 0300 09/19/13 0400 09/19/13 0421 09/19/13 0500  BP: 101/62 99/57  95/60  Pulse: 66 52  63  Temp:  98.1 F (36.7 C)    TempSrc:  Axillary    Resp: 19 18  16   Height:      Weight:    216 lb 11.4 oz (98.3 kg)  SpO2: 89% 93% 93% 93%   Weight change:   Intake/Output Summary (Last 24 hours) at 09/19/13 0747 Last data filed at 09/18/13 2300  Gross per 24 hour  Intake  22.42 ml  Output      0 ml  Net  22.42 ml    Physical Exam: Alert. No distress. Lungs reveal significant improvement in his wheezes. Heart regular with no murmur in the 60s. He is in sinus rhythm on telemetry. Abdomen soft and nontender. Extremities reveal no edema.  Lab Results:    Results for orders placed during the hospital encounter of 09/18/13 (from the past 24 hour(s))  CBC WITH DIFFERENTIAL     Status: Abnormal   Collection Time    09/18/13  5:52 PM      Result Value Ref Range   WBC 17.1 (*) 4.0 - 10.5 K/uL   RBC 4.75  4.22 - 5.81 MIL/uL   Hemoglobin 15.2  13.0 - 17.0 g/dL   HCT 44.1  39.0 - 52.0 %   MCV 92.8  78.0 - 100.0 fL   MCH 32.0  26.0 - 34.0 pg   MCHC 34.5  30.0 - 36.0 g/dL   RDW 12.7  11.5 - 15.5 %   Platelets 186  150 - 400 K/uL   Neutrophils Relative % 83 (*) 43 - 77 %   Neutro Abs 14.2 (*) 1.7 - 7.7 K/uL   Lymphocytes Relative 7 (*) 12 - 46 %   Lymphs Abs 1.3  0.7 - 4.0 K/uL   Monocytes Relative 10  3 - 12 %   Monocytes Absolute 1.6 (*) 0.1 - 1.0 K/uL   Eosinophils Relative 0  0 - 5 %   Eosinophils Absolute 0.0  0.0 - 0.7 K/uL   Basophils Relative 0  0 - 1 %   Basophils Absolute 0.0  0.0 - 0.1 K/uL  COMPREHENSIVE METABOLIC PANEL     Status: Abnormal   Collection Time    09/18/13  5:52 PM      Result Value Ref Range   Sodium 138   137 - 147 mEq/L   Potassium 3.7  3.7 - 5.3 mEq/L   Chloride 96  96 - 112 mEq/L   CO2 26  19 - 32 mEq/L   Glucose, Bld 113 (*) 70 - 99 mg/dL   BUN 12  6 - 23 mg/dL   Creatinine, Ser 0.92  0.50 - 1.35 mg/dL   Calcium 8.9  8.4 - 10.5 mg/dL   Total Protein 7.8  6.0 - 8.3 g/dL   Albumin 4.0  3.5 - 5.2 g/dL   AST 36  0 - 37 U/L   ALT 14  0 - 53 U/L   Alkaline Phosphatase 60  39 - 117 U/L   Total Bilirubin 0.8  0.3 - 1.2 mg/dL   GFR calc non Af Amer 89 (*) >90 mL/min   GFR calc Af Amer >90  >  90 mL/min   Anion gap 16 (*) 5 - 15  MRSA PCR SCREENING     Status: None   Collection Time    09/18/13  6:28 PM      Result Value Ref Range   MRSA by PCR NEGATIVE  NEGATIVE     ABGS No results found for this basename: PHART, PCO2, PO2ART, TCO2, HCO3,  in the last 72 hours CULTURES Recent Results (from the past 240 hour(s))  MRSA PCR SCREENING     Status: None   Collection Time    09/18/13  6:28 PM      Result Value Ref Range Status   MRSA by PCR NEGATIVE  NEGATIVE Final   Comment:            The GeneXpert MRSA Assay (FDA     approved for NASAL specimens     only), is one component of a     comprehensive MRSA colonization     surveillance program. It is not     intended to diagnose MRSA     infection nor to guide or     monitor treatment for     MRSA infections.   Studies/Results: Dg Chest 1 View  09/18/2013   CLINICAL DATA:  Cough and difficulty breathing  EXAM: CHEST - 1 VIEW  COMPARISON:  November 14, 2011  FINDINGS: There is underlying emphysematous change. There is no edema or consolidation. The heart size and pulmonary vascularity are within normal limits. No adenopathy. There is a total shoulder replacement on the left.  IMPRESSION: There is a degree of underlying emphysematous change. No edema or consolidation.   Electronically Signed   By: Lowella Grip M.D.   On: 09/18/2013 18:56   Micro Results: Recent Results (from the past 240 hour(s))  MRSA PCR SCREENING     Status:  None   Collection Time    09/18/13  6:28 PM      Result Value Ref Range Status   MRSA by PCR NEGATIVE  NEGATIVE Final   Comment:            The GeneXpert MRSA Assay (FDA     approved for NASAL specimens     only), is one component of a     comprehensive MRSA colonization     surveillance program. It is not     intended to diagnose MRSA     infection nor to guide or     monitor treatment for     MRSA infections.   Studies/Results: Dg Chest 1 View  09/18/2013   CLINICAL DATA:  Cough and difficulty breathing  EXAM: CHEST - 1 VIEW  COMPARISON:  November 14, 2011  FINDINGS: There is underlying emphysematous change. There is no edema or consolidation. The heart size and pulmonary vascularity are within normal limits. No adenopathy. There is a total shoulder replacement on the left.  IMPRESSION: There is a degree of underlying emphysematous change. No edema or consolidation.   Electronically Signed   By: Lowella Grip M.D.   On: 09/18/2013 18:56   Medications:  I have reviewed the patient's current medications Scheduled Meds: . ALPRAZolam  1 mg Oral TID  . aspirin  324 mg Oral Daily  . cefTRIAXone (ROCEPHIN)  IV  1 g Intravenous Q24H  . diltiazem  240 mg Oral Daily  . enoxaparin (LOVENOX) injection  40 mg Subcutaneous Q24H  . escitalopram  10 mg Oral Daily  . flecainide  75 mg Oral Q12H  .  ipratropium  0.5 mg Nebulization Q4H  . levalbuterol  0.63 mg Nebulization Q4H  . methylPREDNISolone (SOLU-MEDROL) injection  125 mg Intravenous 4 times per day  . pantoprazole  40 mg Oral Daily   Continuous Infusions: . sodium chloride 75 mL/hr at 09/18/13 1831   PRN Meds:.acetaminophen, acetaminophen, alum & mag hydroxide-simeth, ondansetron (ZOFRAN) IV, ondansetron, oxyCODONE, pneumococcal 23 valent vaccine   Assessment/Plan: #1. COPD exacerbation. Chest x-ray reveals no definite evidence of pneumonia. White count 17.1. Continue Rocephin, Solu-Medrol, Xopenex and Atrovent and supplemental  oxygen. Oxygenation is improved with sats in the mid 90s now on 4 L by nasal cannula. #2. PAF. Continue diltiazem and flecainide. Will stop IV diltiazem and restart his usual oral dose. Continue telemetry. Active Problems:   CAP (community acquired pneumonia)     LOS: 1 day   Travis Booker 09/19/2013, 7:47 AM

## 2013-09-19 NOTE — Care Management Note (Addendum)
    Page 1 of 2   09/24/2013     9:31:16 AM CARE MANAGEMENT NOTE 09/24/2013  Patient:  Travis Booker, Travis Booker   Account Number:  [id number]  Date Initiated:  09/19/2013  Documentation initiated by:  Theophilus Kinds  Subjective/Objective Assessment:   Pt admitted from home with COPC exacerbation. Pt lives with his wife and will return home at discharge. Pt stated that he is fairly independent with ADL's.     Action/Plan:   Will need to be assessed for home O2 prior to discharge. Will continue to follow for discharge planning needs.   Anticipated DC Date:  09/24/2013   Anticipated DC Plan:  [place]  CM consult      Choice offered to / List presented to:  C-1 Patient   DME arranged  NEBULIZER MACHINE      DME agency  [facility]        Status of service:  Completed, signed off Medicare Important Message given?  YES (If response is "NO", the following Medicare IM given date fields will be blank) Date Medicare IM given:  09/19/2013 Medicare IM given by:  Christinia Gully C Date Additional Medicare IM given:  09/24/2013 Additional Medicare IM given by:  JESSICA CHILDRESS  Discharge Disposition:  HOME/SELF CARE  Per UR Regulation:    If discussed at Long Length of Stay Meetings, dates discussed:    Comments:  9/2/105 0900 Jolene Provost, RN, MSN, PCCN Pt to [place] home today with self care. Pt's neb machine has been delivered to pt from [facility] and pt has no further CM needs at this time.  09/23/2013 [place], RN, MSN, PCCN Pt plans to discharge home with self. Pt ordered neb machine through [facility], per pt's choice. Emma with [facility] notified, order obtianed from chart and delivered to pt's room. Pt does not meet requirmeents for home 02. Pt has no further CM needs at this time.  09/19/13 [place], RN BSN CM

## 2013-09-20 LAB — CBC
HCT: 38.4 % — ABNORMAL LOW (ref 39.0–52.0)
Hemoglobin: 13 g/dL (ref 13.0–17.0)
MCH: 32 pg (ref 26.0–34.0)
MCHC: 33.9 g/dL (ref 30.0–36.0)
MCV: 94.6 fL (ref 78.0–100.0)
Platelets: 160 10*3/uL (ref 150–400)
RBC: 4.06 MIL/uL — AB (ref 4.22–5.81)
RDW: 13.1 % (ref 11.5–15.5)
WBC: 16.5 10*3/uL — ABNORMAL HIGH (ref 4.0–10.5)

## 2013-09-20 LAB — BASIC METABOLIC PANEL
Anion gap: 12 (ref 5–15)
BUN: 19 mg/dL (ref 6–23)
CO2: 28 mEq/L (ref 19–32)
Calcium: 8.9 mg/dL (ref 8.4–10.5)
Chloride: 102 mEq/L (ref 96–112)
Creatinine, Ser: 0.84 mg/dL (ref 0.50–1.35)
GFR calc Af Amer: >90 mL/min (ref >90)
GLUCOSE: 162 mg/dL — AB (ref 70–99)
POTASSIUM: 3.5 meq/L — AB (ref 3.7–5.3)
Sodium: 142 mEq/L (ref 137–147)

## 2013-09-20 MED ORDER — POTASSIUM CHLORIDE CRYS ER 20 MEQ PO TBCR
20.0000 meq | EXTENDED_RELEASE_TABLET | Freq: Three times a day (TID) | ORAL | Status: AC
  Administered 2013-09-20 – 2013-09-21 (×6): 20 meq via ORAL
  Filled 2013-09-20 (×6): qty 1

## 2013-09-20 NOTE — Progress Notes (Signed)
Subjective: [name] continues to experience a lot of coughing. He has remained in sinus rhythm after intravenous diltiazem was discontinued. He has had no fever.  Objective: Vital signs in last 24 hours: Filed Vitals:   09/20/13 0300 09/20/13 0400 09/20/13 0500 09/20/13 0530  BP: 91/43  109/51   Pulse: 62 66 68 73  Temp:  98.1 F (36.7 C)    TempSrc:      Resp: 11 12 10 15   Height:      Weight:  221 lb 5.5 oz (100.4 kg)    SpO2: 89% 92% 92% 92%   Weight change: 4 lb 6.6 oz (2 kg)  Intake/Output Summary (Last 24 hours) at 09/20/13 0647 Last data filed at 09/20/13 0500  Gross per 24 hour  Intake   2470 ml  Output   2303 ml  Net    167 ml    Physical Exam: Alert and in no distress. Lungs reveal bilateral wheezes. Heart regular in the 70s with no murmurs. Abdomen soft and nontender. Extremities reveal no edema.  Lab Results:    Results for orders placed during the hospital encounter of 09/18/13 (from the past 24 hour(s))  CBC     Status: Abnormal   Collection Time    09/20/13  5:22 AM      Result Value Ref Range   WBC 16.5 (*) 4.0 - 10.5 K/uL   RBC 4.06 (*) 4.22 - 5.81 MIL/uL   Hemoglobin 13.0  13.0 - 17.0 g/dL   HCT 38.4 (*) 39.0 - 52.0 %   MCV 94.6  78.0 - 100.0 fL   MCH 32.0  26.0 - 34.0 pg   MCHC 33.9  30.0 - 36.0 g/dL   RDW 13.1  11.5 - 15.5 %   Platelets 160  150 - 400 K/uL     ABGS No results found for this basename: PHART, PCO2, PO2ART, TCO2, HCO3,  in the last 72 hours CULTURES Recent Results (from the past 240 hour(s))  MRSA PCR SCREENING     Status: None   Collection Time    09/18/13  6:28 PM      Result Value Ref Range Status   MRSA by PCR NEGATIVE  NEGATIVE Final   Comment:            The GeneXpert MRSA Assay (FDA     approved for NASAL specimens     only), is one component of a     comprehensive MRSA colonization     surveillance program. It is not     intended to diagnose MRSA     infection nor to guide or     monitor treatment for     MRSA  infections.   Studies/Results: Dg Chest 1 View  09/18/2013   CLINICAL DATA:  Cough and difficulty breathing  EXAM: CHEST - 1 VIEW  COMPARISON:  November 14, 2011  FINDINGS: There is underlying emphysematous change. There is no edema or consolidation. The heart size and pulmonary vascularity are within normal limits. No adenopathy. There is a total shoulder replacement on the left.  IMPRESSION: There is a degree of underlying emphysematous change. No edema or consolidation.   Electronically Signed   By: Lowella Grip M.D.   On: 09/18/2013 18:56   Micro Results: Recent Results (from the past 240 hour(s))  MRSA PCR SCREENING     Status: None   Collection Time    09/18/13  6:28 PM      Result Value Ref Range Status  MRSA by PCR NEGATIVE  NEGATIVE Final   Comment:            The GeneXpert MRSA Assay (FDA     approved for NASAL specimens     only), is one component of a     comprehensive MRSA colonization     surveillance program. It is not     intended to diagnose MRSA     infection nor to guide or     monitor treatment for     MRSA infections.   Studies/Results: Dg Chest 1 View  09/18/2013   CLINICAL DATA:  Cough and difficulty breathing  EXAM: CHEST - 1 VIEW  COMPARISON:  November 14, 2011  FINDINGS: There is underlying emphysematous change. There is no edema or consolidation. The heart size and pulmonary vascularity are within normal limits. No adenopathy. There is a total shoulder replacement on the left.  IMPRESSION: There is a degree of underlying emphysematous change. No edema or consolidation.   Electronically Signed   By: Lowella Grip M.D.   On: 09/18/2013 18:56   Medications:  I have reviewed the patient's current medications Scheduled Meds: . ALPRAZolam  1 mg Oral TID  . aspirin  324 mg Oral Daily  . cefTRIAXone (ROCEPHIN)  IV  1 g Intravenous Q24H  . diltiazem  240 mg Oral Daily  . enoxaparin (LOVENOX) injection  40 mg Subcutaneous Q24H  . escitalopram  10 mg Oral  Daily  . flecainide  75 mg Oral Q12H  . ipratropium  0.5 mg Nebulization Q4H  . levalbuterol  0.63 mg Nebulization Q4H  . methylPREDNISolone (SOLU-MEDROL) injection  125 mg Intravenous 4 times per day  . pantoprazole  40 mg Oral Daily   Continuous Infusions:  PRN Meds:.acetaminophen, acetaminophen, alum & mag hydroxide-simeth, HYDROcodone-homatropine, ondansetron (ZOFRAN) IV, ondansetron, oxyCODONE, pneumococcal 23 valent vaccine   Assessment/Plan: #1. COPD exacerbation. Continue Rocephin, Solu-Medrol, nebulizer treatments and supplemental oxygen. His oxygen saturation is in the low 90s on oxygen at 4 L by nasal cannula. Move out of step down to a telemetry bed today. His white count is minimally decreased at 16.5. #2. PAF. Continue oral diltiazem and flecainide. Active Problems:   CAP (community acquired pneumonia)     LOS: 2 days   Travis Booker 09/20/2013, 6:47 AM

## 2013-09-21 MED ORDER — METHYLPREDNISOLONE SODIUM SUCC 125 MG IJ SOLR
80.0000 mg | Freq: Four times a day (QID) | INTRAMUSCULAR | Status: DC
  Administered 2013-09-21 – 2013-09-24 (×12): 80 mg via INTRAVENOUS
  Filled 2013-09-21 (×12): qty 2

## 2013-09-21 MED ORDER — GUAIFENESIN ER 600 MG PO TB12
1200.0000 mg | ORAL_TABLET | Freq: Two times a day (BID) | ORAL | Status: DC
  Administered 2013-09-21 – 2013-09-24 (×7): 1200 mg via ORAL
  Filled 2013-09-21 (×7): qty 2

## 2013-09-21 NOTE — Progress Notes (Signed)
Subjective: Travis Booker complains of continued coughing and wheezing. He has not experienced fever. Oxygen saturations have remained in the 90s on supplemental oxygen.  Objective: Vital signs in last 24 hours: Filed Vitals:   09/21/13 0434 09/21/13 0635 09/21/13 0726 09/21/13 0915  BP:  108/69    Pulse:  50  64  Temp:  97.5 F (36.4 C)    TempSrc:  Oral    Resp:  20    Height:      Weight: 223 lb 5.2 oz (101.3 kg)     SpO2:  97% 93%    Weight change: 1 lb 15.8 oz (0.9 kg)  Intake/Output Summary (Last 24 hours) at 09/21/13 1015 Last data filed at 09/20/13 1801  Gross per 24 hour  Intake    240 ml  Output    600 ml  Net   -360 ml    Physical Exam: Coughing frequently. Lungs reveal bilateral wheezes. Heart regular, bradycardic with no murmurs. Abdomen is soft and nontender. Extremities reveal no edema.  Lab Results:   No results found for this or any previous visit (from the past 24 hour(s)).   ABGS No results found for this basename: PHART, PCO2, PO2ART, TCO2, HCO3,  in the last 72 hours CULTURES Recent Results (from the past 240 hour(s))  MRSA PCR SCREENING     Status: None   Collection Time    09/18/13  6:28 PM      Result Value Ref Range Status   MRSA by PCR NEGATIVE  NEGATIVE Final   Comment:            The GeneXpert MRSA Assay (FDA     approved for NASAL specimens     only), is one component of a     comprehensive MRSA colonization     surveillance program. It is not     intended to diagnose MRSA     infection nor to guide or     monitor treatment for     MRSA infections.   Studies/Results: No results found. Micro Results: Recent Results (from the past 240 hour(s))  MRSA PCR SCREENING     Status: None   Collection Time    09/18/13  6:28 PM      Result Value Ref Range Status   MRSA by PCR NEGATIVE  NEGATIVE Final   Comment:            The GeneXpert MRSA Assay (FDA     approved for NASAL specimens     only), is one component of a     comprehensive MRSA  colonization     surveillance program. It is not     intended to diagnose MRSA     infection nor to guide or     monitor treatment for     MRSA infections.   Studies/Results: No results found. Medications:  I have reviewed the patient's current medications Scheduled Meds: . ALPRAZolam  1 mg Oral TID  . aspirin  324 mg Oral Daily  . cefTRIAXone (ROCEPHIN)  IV  1 g Intravenous Q24H  . diltiazem  240 mg Oral Daily  . enoxaparin (LOVENOX) injection  40 mg Subcutaneous Q24H  . escitalopram  10 mg Oral Daily  . flecainide  75 mg Oral Q12H  . ipratropium  0.5 mg Nebulization Q4H  . levalbuterol  0.63 mg Nebulization Q4H  . methylPREDNISolone (SOLU-MEDROL) injection  80 mg Intravenous 4 times per day  . pantoprazole  40 mg Oral Daily  .  potassium chloride  20 mEq Oral TID   Continuous Infusions:  PRN Meds:.acetaminophen, acetaminophen, alum & mag hydroxide-simeth, HYDROcodone-homatropine, ondansetron (ZOFRAN) IV, ondansetron, oxyCODONE, pneumococcal 23 valent vaccine   Assessment/Plan: #1. COPD exacerbation. Continue ceftriaxone, Solu-Medrol, Xopenex and Atrovent and supplemental oxygen. #2. PAF. He is maintaining sinus rhythm. Continue diltiazem and flecainide. #3. Hypokalemia. Continue oral supplements. Repeat metabolic panel and CBC tomorrow. Active Problems:   CAP (community acquired pneumonia)     LOS: 3 days   Loreen Bankson 09/21/2013, 10:15 AM

## 2013-09-22 LAB — CBC
HCT: 39.1 % (ref 39.0–52.0)
Hemoglobin: 13 g/dL (ref 13.0–17.0)
MCH: 31.6 pg (ref 26.0–34.0)
MCHC: 33.2 g/dL (ref 30.0–36.0)
MCV: 94.9 fL (ref 78.0–100.0)
PLATELETS: 187 10*3/uL (ref 150–400)
RBC: 4.12 MIL/uL — ABNORMAL LOW (ref 4.22–5.81)
RDW: 13.2 % (ref 11.5–15.5)
WBC: 9.1 10*3/uL (ref 4.0–10.5)

## 2013-09-22 LAB — BASIC METABOLIC PANEL
Anion gap: 9 (ref 5–15)
BUN: 22 mg/dL (ref 6–23)
CO2: 29 mEq/L (ref 19–32)
Calcium: 9 mg/dL (ref 8.4–10.5)
Chloride: 105 mEq/L (ref 96–112)
Creatinine, Ser: 0.73 mg/dL (ref 0.50–1.35)
GFR calc non Af Amer: >90 mL/min (ref >90)
GLUCOSE: 126 mg/dL — AB (ref 70–99)
Potassium: 4.6 mEq/L (ref 3.7–5.3)
Sodium: 143 mEq/L (ref 137–147)

## 2013-09-22 MED ORDER — LEVALBUTEROL HCL 0.63 MG/3ML IN NEBU
0.6300 mg | INHALATION_SOLUTION | Freq: Three times a day (TID) | RESPIRATORY_TRACT | Status: DC
  Administered 2013-09-22 – 2013-09-24 (×6): 0.63 mg via RESPIRATORY_TRACT
  Filled 2013-09-22 (×6): qty 3

## 2013-09-22 MED ORDER — IPRATROPIUM BROMIDE 0.02 % IN SOLN
0.5000 mg | Freq: Three times a day (TID) | RESPIRATORY_TRACT | Status: DC
  Administered 2013-09-22 – 2013-09-24 (×6): 0.5 mg via RESPIRATORY_TRACT
  Filled 2013-09-22 (×6): qty 2.5

## 2013-09-22 NOTE — Progress Notes (Signed)
Subjective: He states he is feeling better. His wheezing is improving. He continues to have a lot of coughing.  Objective: Vital signs in last 24 hours: Filed Vitals:   09/21/13 2321 09/22/13 0538 09/22/13 0715 09/22/13 0720  BP:  123/70    Pulse:  59  57  Temp:  97.7 F (36.5 C)    TempSrc:  Oral    Resp:  20    Height:      Weight:  234 lb 8 oz (106.369 kg)    SpO2: 96% 97% 94% 94%   Weight change: 11 lb 2.8 oz (5.068 kg)  Intake/Output Summary (Last 24 hours) at 09/22/13 0746 Last data filed at 09/21/13 1954  Gross per 24 hour  Intake    820 ml  Output      0 ml  Net    820 ml    Physical Exam: No distress. Lungs reveal bilateral wheezes. Heart regular with no murmurs. He believes he had an episode of atrial fibrillation last night. Abdomen soft and nontender.   Lab Results:    Results for orders placed during the hospital encounter of 09/18/13 (from the past 24 hour(s))  BASIC METABOLIC PANEL     Status: Abnormal   Collection Time    09/22/13  6:18 AM      Result Value Ref Range   Sodium 143  137 - 147 mEq/L   Potassium 4.6  3.7 - 5.3 mEq/L   Chloride 105  96 - 112 mEq/L   CO2 29  19 - 32 mEq/L   Glucose, Bld 126 (*) 70 - 99 mg/dL   BUN 22  6 - 23 mg/dL   Creatinine, Ser 0.73  0.50 - 1.35 mg/dL   Calcium 9.0  8.4 - 10.5 mg/dL   GFR calc non Af Amer >90  >90 mL/min   GFR calc Af Amer >90  >90 mL/min   Anion gap 9  5 - 15  CBC     Status: Abnormal   Collection Time    09/22/13  6:18 AM      Result Value Ref Range   WBC 9.1  4.0 - 10.5 K/uL   RBC 4.12 (*) 4.22 - 5.81 MIL/uL   Hemoglobin 13.0  13.0 - 17.0 g/dL   HCT 39.1  39.0 - 52.0 %   MCV 94.9  78.0 - 100.0 fL   MCH 31.6  26.0 - 34.0 pg   MCHC 33.2  30.0 - 36.0 g/dL   RDW 13.2  11.5 - 15.5 %   Platelets 187  150 - 400 K/uL     ABGS No results found for this basename: PHART, PCO2, PO2ART, TCO2, HCO3,  in the last 72 hours CULTURES Recent Results (from the past 240 hour(s))  MRSA PCR SCREENING      Status: None   Collection Time    09/18/13  6:28 PM      Result Value Ref Range Status   MRSA by PCR NEGATIVE  NEGATIVE Final   Comment:            The GeneXpert MRSA Assay (FDA     approved for NASAL specimens     only), is one component of a     comprehensive MRSA colonization     surveillance program. It is not     intended to diagnose MRSA     infection nor to guide or     monitor treatment for     MRSA infections.  Studies/Results: No results found. Micro Results: Recent Results (from the past 240 hour(s))  MRSA PCR SCREENING     Status: None   Collection Time    09/18/13  6:28 PM      Result Value Ref Range Status   MRSA by PCR NEGATIVE  NEGATIVE Final   Comment:            The GeneXpert MRSA Assay (FDA     approved for NASAL specimens     only), is one component of a     comprehensive MRSA colonization     surveillance program. It is not     intended to diagnose MRSA     infection nor to guide or     monitor treatment for     MRSA infections.   Studies/Results: No results found. Medications:  I have reviewed the patient's current medications Scheduled Meds: . ALPRAZolam  1 mg Oral TID  . aspirin  324 mg Oral Daily  . cefTRIAXone (ROCEPHIN)  IV  1 g Intravenous Q24H  . diltiazem  240 mg Oral Daily  . enoxaparin (LOVENOX) injection  40 mg Subcutaneous Q24H  . escitalopram  10 mg Oral Daily  . flecainide  75 mg Oral Q12H  . guaiFENesin  1,200 mg Oral BID  . ipratropium  0.5 mg Nebulization TID  . levalbuterol  0.63 mg Nebulization TID  . methylPREDNISolone (SOLU-MEDROL) injection  80 mg Intravenous 4 times per day  . pantoprazole  40 mg Oral Daily   Continuous Infusions:  PRN Meds:.acetaminophen, acetaminophen, alum & mag hydroxide-simeth, HYDROcodone-homatropine, ondansetron (ZOFRAN) IV, ondansetron, oxyCODONE, pneumococcal 23 valent vaccine   Assessment/Plan: #1. COPD exacerbation. Slowly improving. Continue current treatment. White count has  normalized to 9.1. #2. Hypokalemia. Resolved to 4.5. #3. PAF. Maintaining sinus rhythm this morning. Continue diltiazem and flecainide. Active Problems:   CAP (community acquired pneumonia)     LOS: 4 days   Travis Booker 09/22/2013, 7:46 AM

## 2013-09-23 NOTE — Progress Notes (Signed)
On room air, patient was 97% at rest.  Patient ambulated in hall 1000 ft with stand by assistance and oxygen saturation was 96% on room air.  Will continue to monitor patient.

## 2013-09-23 NOTE — Progress Notes (Signed)
Subjective: Travis Booker continues to cough and wheeze and is feeling better. He has been able to get up out of the bed and is requiring less oxygen support. He has had no fever.  Objective: Vital signs in last 24 hours: Filed Vitals:   09/22/13 1912 09/22/13 2001 09/23/13 0500 09/23/13 0704  BP:  134/63 130/79   Pulse:  59 52   Temp:  97.2 F (36.2 C) 97.8 F (36.6 C)   TempSrc:  Oral Oral   Resp:  20 20   Height:      Weight:   236 lb 12.4 oz (107.4 kg)   SpO2: 97% 98% 95% 92%   Weight change: 2 lb 4.4 oz (1.031 kg)  Intake/Output Summary (Last 24 hours) at 09/23/13 0734 Last data filed at 09/23/13 0510  Gross per 24 hour  Intake    600 ml  Output   1000 ml  Net   -400 ml    Physical Exam: No distress. Lungs decreased wheezes. Improved air movement. Heart regular with no murmurs.  Lab Results:   No results found for this or any previous visit (from the past 24 hour(s)).   ABGS No results found for this basename: PHART, PCO2, PO2ART, TCO2, HCO3,  in the last 72 hours CULTURES Recent Results (from the past 240 hour(s))  MRSA PCR SCREENING     Status: None   Collection Time    09/18/13  6:28 PM      Result Value Ref Range Status   MRSA by PCR NEGATIVE  NEGATIVE Final   Comment:            The GeneXpert MRSA Assay (FDA     approved for NASAL specimens     only), is one component of a     comprehensive MRSA colonization     surveillance program. It is not     intended to diagnose MRSA     infection nor to guide or     monitor treatment for     MRSA infections.   Studies/Results: No results found. Micro Results: Recent Results (from the past 240 hour(s))  MRSA PCR SCREENING     Status: None   Collection Time    09/18/13  6:28 PM      Result Value Ref Range Status   MRSA by PCR NEGATIVE  NEGATIVE Final   Comment:            The GeneXpert MRSA Assay (FDA     approved for NASAL specimens     only), is one component of a     comprehensive MRSA colonization   surveillance program. It is not     intended to diagnose MRSA     infection nor to guide or     monitor treatment for     MRSA infections.   Studies/Results: No results found. Medications:  I have reviewed the patient's current medications Scheduled Meds: . ALPRAZolam  1 mg Oral TID  . aspirin  324 mg Oral Daily  . cefTRIAXone (ROCEPHIN)  IV  1 g Intravenous Q24H  . diltiazem  240 mg Oral Daily  . enoxaparin (LOVENOX) injection  40 mg Subcutaneous Q24H  . escitalopram  10 mg Oral Daily  . flecainide  75 mg Oral Q12H  . guaiFENesin  1,200 mg Oral BID  . ipratropium  0.5 mg Nebulization TID  . levalbuterol  0.63 mg Nebulization TID  . methylPREDNISolone (SOLU-MEDROL) injection  80 mg Intravenous 4 times per day  .  pantoprazole  40 mg Oral Daily   Continuous Infusions:  PRN Meds:.acetaminophen, acetaminophen, alum & mag hydroxide-simeth, HYDROcodone-homatropine, ondansetron (ZOFRAN) IV, ondansetron, oxyCODONE, pneumococcal 23 valent vaccine   Assessment/Plan: #1. COPD exacerbation. Improving. Continue oxygen, nebulizers, Rocephin and Solu-Medrol. Continue Mucinex. #2. PAF. Maintaining sinus rhythm. Active Problems:   CAP (community acquired pneumonia)     LOS: 5 days   Sanvika Cuttino 09/23/2013, 7:34 AM

## 2013-09-24 MED ORDER — LEVALBUTEROL HCL 0.63 MG/3ML IN NEBU: 0.6300 mg | INHALATION_SOLUTION | Freq: Three times a day (TID) | RESPIRATORY_TRACT | Status: DC

## 2013-09-24 MED ORDER — PREDNISONE (PAK) 10 MG PO TABS: ORAL_TABLET | Freq: Every day | ORAL | Status: DC

## 2013-09-24 MED ORDER — CEFUROXIME AXETIL 500 MG PO TABS: 500.0000 mg | ORAL_TABLET | Freq: Two times a day (BID) | ORAL | Status: DC

## 2013-09-24 NOTE — Discharge Summary (Signed)
Physician Discharge Summary  Travis Booker:[id number] DOB: 22-Aug-1950 DOA: 09/18/2013   Admit date: 09/18/2013 Discharge date: 09/24/2013  Discharge Diagnoses: #1. COPD exacerbation. #2. Paroxysmal atrial fibrillation. #3. Hypokalemia. #4. Chronic pain syndrome. #5. Anxiety. #6. Esophageal reflux. Active Problems:   CAP (community acquired pneumonia)    Wt Readings from Last 3 Encounters:  09/24/13 235 lb 14.4 oz (107.004 kg)  11/27/12 208 lb (94.348 kg)  10/16/12 221 lb (100.245 kg)     Hospital Course:  This patient is a 63 year old male who presented with coughing, wheezing and hypoxia. His chest x-ray revealed no acute infiltrate. His oxygen saturation was 85% on room air. He had a leukocytosis of 17,000. He was treated with Rocephin, Solu-Medrol, Xopenex, Atrovent, Mucinex and supple oxygen. His wheezing and coughing gradually resolved. His leukocytosis resolved. He was afebrile. He was initially in atrial fibrillation with a rapid ventricular response requiring treatment with intravenous diltiazem. He converted to normal sinus rhythm and was then continued on oral diltiazem and flecainide. He had no recurrent PAF following his spontaneous conversion.  Respiratory status has now significantly improved to the point is stable for discharge on the morning of 9/2. He'll be seen in followup in my office in one week. He'll continue antibiotic therapy with oral cefuroxime. He will continue a prednisone taper. He'll use Hydromet as needed for cough. She will require supplemental oxygen at 2 L by nasal cannula. He'll use home nebulizer treatments with Xopenex 3 times a day.   Discharge Instructions  Discharge Instructions   For home use only DME oxygen    Complete by:  As directed   Mode or (Route):  Nasal cannula  Liters per Minute:  2  Frequency:  Continuous (stationary and portable oxygen unit needed)  Oxygen conserving device:  No  Oxygen delivery system:  Gas             Medication List         ALPRAZolam 1 MG tablet  Commonly known as:  XANAX  Take 1 mg by mouth at bedtime as needed. nerves     aspirin 325 MG tablet  Take 325 mg by mouth daily.     cefUROXime 500 MG tablet  Commonly known as:  CEFTIN  Take 1 tablet (500 mg total) by mouth 2 (two) times daily with a meal.     diltiazem 240 MG 24 hr capsule  Commonly known as:  CARDIZEM CD  TAKE 1 CAPSULE (240 MG TOTAL) BY MOUTH DAILY.     escitalopram 10 MG tablet  Commonly known as:  LEXAPRO  Take 10 mg by mouth daily.     esomeprazole 40 MG capsule  Commonly known as:  NEXIUM  Take 40 mg by mouth daily at 12 noon.     fish oil-omega-3 fatty acids 1000 MG capsule  Take 1 g by mouth daily.     flecainide 100 MG tablet  Commonly known as:  TAMBOCOR  Take 1 tablet (100 mg total) by mouth 2 (two) times daily.     levalbuterol 0.63 MG/3ML nebulizer solution  Commonly known as:  XOPENEX  Take 3 mLs (0.63 mg total) by nebulization 3 (three) times daily.     losartan 100 MG tablet  Commonly known as:  COZAAR  Take 100 mg by mouth daily.     oxyCODONE-acetaminophen 5-325 MG per tablet  Commonly known as:  PERCOCET/ROXICET  Take 1 tablet by mouth every 4 (four) hours as needed. pain  predniSONE 10 MG tablet  Commonly known as:  STERAPRED UNI-PAK  Take by mouth daily.     triamterene-hydrochlorothiazide 37.5-25 MG per tablet  Commonly known as:  MAXZIDE-25  Take 1 tablet by mouth daily.     VIAGRA 100 MG tablet  Generic drug:  sildenafil  Take 1 tablet by mouth once as needed. Erectile dysfunction         Travis Booker 09/24/2013

## 2013-09-24 NOTE — Progress Notes (Signed)
UR review complete.  

## 2013-09-24 NOTE — Progress Notes (Signed)
AVS reviewed with patient.  Verbalized understanding of discharge instructions, physician follow-up, and medications.  Prescription provided to patient.  Patient's IV removed.  Site WNL.  Patient transported by NT via w/c to main entrance for discharge.  Patient stable at time of discharge.

## 2013-10-01 ENCOUNTER — Other Ambulatory Visit: Payer: Self-pay | Admitting: Cardiology

## 2013-10-01 ENCOUNTER — Encounter: Payer: Self-pay | Admitting: Cardiology

## 2013-10-01 ENCOUNTER — Ambulatory Visit (INDEPENDENT_AMBULATORY_CARE_PROVIDER_SITE_OTHER): Payer: Medicare Other | Admitting: Cardiology

## 2013-10-01 VITALS — BP 112/72 | HR 82 | Ht 72.0 in | Wt 214.0 lb

## 2013-10-01 DIAGNOSIS — I10 Essential (primary) hypertension: Secondary | ICD-10-CM

## 2013-10-01 DIAGNOSIS — I4891 Unspecified atrial fibrillation: Secondary | ICD-10-CM

## 2013-10-01 NOTE — Patient Instructions (Signed)
Your physician wants you to follow-up in: 6 months You will receive a reminder letter in the mail two months in advance. If you don't receive a letter, please call our office to schedule the follow-up appointment.    Your physician has recommended you make the following change in your medication:      Take Flecainide 100 mg twice a day     Thank you for choosing [facility] !

## 2013-10-01 NOTE — Progress Notes (Signed)
Clinical Summary Mr. Krzyzanowski is a 63 y.o.male seen today for follow up of the following medical problems.   1. Afib  - on flecanide and dilt, on ASA. CHADS2 score is 1.  - recent admit with severe pneumonia earlier this month, presented with afib with RVR managed with dilt gtt, transitioned back to oral dilt and flecanide.  - occasional palpitations short in duration since discharge.   2. HTN  - compliant with meds  3. Day time somnolence  - patient reports for several years  - reports history of snoring, his wife has told him she has witnessed apneic episodes  - never had a sleep study before, ordered last visit but he has not completed  4. History of pneumonia - recent admit earlier this month with pneumonia, O2 sats 85% on room air - still has some fatigue since discharge that is slowly resolving.    Past Medical History  Diagnosis Date  . Paroxysmal atrial fibrillation     a. flecainide therapy;  b. echo 6/09: EF normal, mild LVH;   c. Myoview 7/09: EF 59%, no ischemia  . HTN (hypertension)   . GERD (gastroesophageal reflux disease)   . COPD (chronic obstructive pulmonary disease)   . Depression with anxiety   . DJD (degenerative joint disease)   . Cervical disc disease     surgery planned for 05/25/10 (Dr. Hal Neer)     No Known Allergies   Current Outpatient Prescriptions  Medication Sig Dispense Refill  . ALPRAZolam (XANAX) 1 MG tablet Take 1 mg by mouth at bedtime as needed. nerves      . aspirin 325 MG tablet Take 325 mg by mouth daily.        . cefUROXime (CEFTIN) 500 MG tablet Take 1 tablet (500 mg total) by mouth 2 (two) times daily with a meal.  8 tablet  0  . diltiazem (CARDIZEM CD) 240 MG 24 hr capsule TAKE 1 CAPSULE (240 MG TOTAL) BY MOUTH DAILY.  30 capsule  0  . escitalopram (LEXAPRO) 10 MG tablet Take 10 mg by mouth daily.        Marland Kitchen esomeprazole (NEXIUM) 40 MG capsule Take 40 mg by mouth daily at 12 noon.      . fish oil-omega-3 fatty acids 1000 MG  capsule Take 1 g by mouth daily.      . flecainide (TAMBOCOR) 100 MG tablet Take 1 tablet (100 mg total) by mouth 2 (two) times daily.  90 tablet  2  . levalbuterol (XOPENEX) 0.63 MG/3ML nebulizer solution Take 3 mLs (0.63 mg total) by nebulization 3 (three) times daily.  90 mL  12  . losartan (COZAAR) 100 MG tablet Take 100 mg by mouth daily.      Marland Kitchen oxyCODONE-acetaminophen (PERCOCET) 5-325 MG per tablet Take 1 tablet by mouth every 4 (four) hours as needed. pain      . predniSONE (STERAPRED UNI-PAK) 10 MG tablet Take by mouth daily.  1 tablet  0  . triamterene-hydrochlorothiazide (MAXZIDE-25) 37.5-25 MG per tablet Take 1 tablet by mouth daily.  30 tablet  3  . VIAGRA 100 MG tablet Take 1 tablet by mouth once as needed. Erectile dysfunction       No current facility-administered medications for this visit.     Past Surgical History  Procedure Laterality Date  . Total shoulder arthroplasty    . Lumbar back surgery    . Wrist surgery    . Colonoscopy  10/07/03  normal rectum, submucosal nodule in mid-descending colon c/w lipoma  . Fused c4, c5, c6      anterior approach  . Colonoscopy  03/22/2011    Procedure: COLONOSCOPY;  Surgeon: Daneil Dolin, MD;  Location: [facility];  Service: Endoscopy;  Laterality: N/A;  2:30  . Cholecystectomy    . Appendectomy       No Known Allergies    Family History  Problem Relation Age of Onset  . Cancer Mother   . Dementia Mother   . Heart attack Brother 53  . Colon cancer Neg Hx      Social History Mr. Mccubbins reports that he has quit smoking. He has never used smokeless tobacco. Mr. Gesner reports that he drinks about one ounce of alcohol per week.   Review of Systems CONSTITUTIONAL: No weight loss, fever, chills, weakness or fatigue.  HEENT: Eyes: No visual loss, blurred vision, double vision or yellow sclerae.No hearing loss, sneezing, congestion, runny nose or sore throat.  SKIN: No rash or itching.  CARDIOVASCULAR: per  HPI RESPIRATORY: +SOB GASTROINTESTINAL: No anorexia, nausea, vomiting or diarrhea. No abdominal pain or blood.  GENITOURINARY: No burning on urination, no polyuria NEUROLOGICAL: No headache, dizziness, syncope, paralysis, ataxia, numbness or tingling in the extremities. No change in bowel or bladder control.  MUSCULOSKELETAL: No muscle, back pain, joint pain or stiffness.  LYMPHATICS: No enlarged nodes. No history of splenectomy.  PSYCHIATRIC: No history of depression or anxiety.  ENDOCRINOLOGIC: No reports of sweating, cold or heat intolerance. No polyuria or polydipsia.  Marland Kitchen   Physical Examination p 82 bp 112/72 Wt 214 BMI 29 Gen: resting comfortably, no acute distress HEENT: no scleral icterus, pupils equal round and reactive, no palptable cervical adenopathy,  CV: RRR, no m/r/g, no JVD, no carotid bruits Resp: + expiratory wheezing GI: abdomen is soft, non-tender, non-distended, normal bowel sounds, no hepatosplenomegaly MSK: extremities are warm, no edema.  Skin: warm, no rash Neuro:  no focal deficits Psych: appropriate affect    Assessment and Plan  1. Afib  - no significant current symptoms. Continue flecanide and diltiazem. CHADS2 score is 1, we will continue ASA   2. HTN  - at goal, continue current meds   3. Day time somnolence  - several risk factors and symptoms suggestive of obstructive sleep apnea  - has not had sleep study yet, defer at this time due to continued recovery from recent pneumonia    F/u 6 months     Arnoldo Lenis, M.D., F.A.C.C.

## 2013-10-15 ENCOUNTER — Other Ambulatory Visit (HOSPITAL_COMMUNITY): Payer: Self-pay | Admitting: Internal Medicine

## 2013-10-15 DIAGNOSIS — R042 Hemoptysis: Secondary | ICD-10-CM

## 2013-10-17 ENCOUNTER — Ambulatory Visit (HOSPITAL_COMMUNITY)
Admission: RE | Admit: 2013-10-17 | Discharge: 2013-10-17 | Disposition: A | Discharge status: Home / Self Care | Payer: Medicare Other | Source: Ambulatory Visit | Attending: Internal Medicine | Admitting: Internal Medicine

## 2013-10-17 DIAGNOSIS — K2289 Other specified disease of esophagus: Secondary | ICD-10-CM | POA: Insufficient documentation

## 2013-10-17 DIAGNOSIS — R042 Hemoptysis: Secondary | ICD-10-CM

## 2013-10-17 DIAGNOSIS — F17200 Nicotine dependence, unspecified, uncomplicated: Secondary | ICD-10-CM | POA: Diagnosis not present

## 2013-10-17 DIAGNOSIS — K228 Other specified diseases of esophagus: Secondary | ICD-10-CM | POA: Insufficient documentation

## 2013-10-17 DIAGNOSIS — J189 Pneumonia, unspecified organism: Secondary | ICD-10-CM | POA: Diagnosis present

## 2013-10-17 LAB — POCT I-STAT CREATININE: CREATININE: 1 mg/dL (ref 0.50–1.35)

## 2013-10-17 MED ORDER — IOHEXOL 300 MG/ML  SOLN
80.0000 mL | Freq: Once | INTRAMUSCULAR | Status: AC | PRN
  Administered 2013-10-17: 80 mL via INTRAVENOUS

## 2014-02-02 ENCOUNTER — Other Ambulatory Visit: Payer: Self-pay | Admitting: Cardiology

## 2014-02-02 MED ORDER — TRIAMTERENE-HCTZ 37.5-25 MG PO TABS: 1.0000 | ORAL_TABLET | Freq: Every day | ORAL | Status: DC

## 2014-02-02 NOTE — Telephone Encounter (Signed)
Received fax refill request  Rx # [id number] Medication:  Triameterene-HCTZ 37.5 / 25 mg tab Qty 30 Sig:  Take one tablet by mouth daily Physician:  Harl Bowie

## 2014-02-06 DIAGNOSIS — I480 Paroxysmal atrial fibrillation: Secondary | ICD-10-CM | POA: Diagnosis not present

## 2014-02-06 DIAGNOSIS — I10 Essential (primary) hypertension: Secondary | ICD-10-CM | POA: Diagnosis not present

## 2014-02-06 DIAGNOSIS — F419 Anxiety disorder, unspecified: Secondary | ICD-10-CM | POA: Diagnosis not present

## 2014-05-07 DIAGNOSIS — G8929 Other chronic pain: Secondary | ICD-10-CM | POA: Diagnosis not present

## 2014-05-07 DIAGNOSIS — J440 Chronic obstructive pulmonary disease with acute lower respiratory infection: Secondary | ICD-10-CM | POA: Diagnosis not present

## 2014-05-07 DIAGNOSIS — I4891 Unspecified atrial fibrillation: Secondary | ICD-10-CM | POA: Diagnosis not present

## 2014-05-07 DIAGNOSIS — I10 Essential (primary) hypertension: Secondary | ICD-10-CM | POA: Diagnosis not present

## 2014-05-12 ENCOUNTER — Other Ambulatory Visit (HOSPITAL_COMMUNITY): Payer: Self-pay | Admitting: Radiology

## 2014-05-12 DIAGNOSIS — G4730 Sleep apnea, unspecified: Secondary | ICD-10-CM

## 2014-05-12 DIAGNOSIS — J441 Chronic obstructive pulmonary disease with (acute) exacerbation: Secondary | ICD-10-CM

## 2014-06-03 ENCOUNTER — Ambulatory Visit (HOSPITAL_COMMUNITY): Admission: RE | Admit: 2014-06-03 | Payer: Self-pay | Source: Ambulatory Visit

## 2014-06-23 ENCOUNTER — Encounter: Payer: Self-pay | Admitting: Cardiology

## 2014-06-23 ENCOUNTER — Other Ambulatory Visit: Payer: Self-pay | Admitting: Cardiology

## 2014-06-23 MED ORDER — DILTIAZEM HCL ER COATED BEADS 240 MG PO CP24: 240.0000 mg | ORAL_CAPSULE | Freq: Every day | ORAL | Status: DC

## 2014-06-23 NOTE — Progress Notes (Signed)
ERROR

## 2014-06-23 NOTE — Telephone Encounter (Signed)
Received fax refill request  Rx # [id number] Medication:  Diltiazem 24 HR ER 240 mg cap Qty 30 Sig:  Take one capsule by mouth daily Physician:  Harl Bowie

## 2014-08-19 ENCOUNTER — Other Ambulatory Visit (HOSPITAL_COMMUNITY): Payer: Self-pay | Admitting: Respiratory Therapy

## 2014-09-18 ENCOUNTER — Ambulatory Visit (HOSPITAL_COMMUNITY)
Admission: RE | Admit: 2014-09-18 | Discharge: 2014-09-18 | Disposition: A | Discharge status: Home / Self Care | Payer: Medicare Other | Source: Ambulatory Visit | Attending: Internal Medicine | Admitting: Internal Medicine

## 2014-09-18 ENCOUNTER — Ambulatory Visit: Payer: Medicare Other | Admitting: Sleep Medicine

## 2014-09-18 DIAGNOSIS — G4733 Obstructive sleep apnea (adult) (pediatric): Secondary | ICD-10-CM | POA: Insufficient documentation

## 2014-09-18 DIAGNOSIS — R0609 Other forms of dyspnea: Secondary | ICD-10-CM | POA: Diagnosis not present

## 2014-09-18 DIAGNOSIS — G4730 Sleep apnea, unspecified: Secondary | ICD-10-CM

## 2014-09-18 DIAGNOSIS — J449 Chronic obstructive pulmonary disease, unspecified: Secondary | ICD-10-CM | POA: Diagnosis not present

## 2014-09-18 LAB — PULMONARY FUNCTION TEST
DL/VA % PRED: 73 %
DL/VA: 3.47 ml/min/mmHg/L
DLCO unc % pred: 62 %
DLCO unc: 22.02 ml/min/mmHg
FEF 25-75 PRE: 1 L/s
FEF2575-%Pred-Pre: 33 %
FEV1-%Pred-Pre: 53 %
FEV1-Pre: 2 L
FEV1FVC-%Pred-Pre: 77 %
FEV6-%PRED-PRE: 71 %
FEV6-PRE: 3.4 L
FEV6FVC-%Pred-Pre: 104 %
FVC-%Pred-Pre: 68 %
FVC-Pre: 3.44 L
Pre FEV1/FVC ratio: 58 %
Pre FEV6/FVC Ratio: 99 %
RV % PRED: 151 %
RV: 3.66 L
TLC % pred: 99 %
TLC: 7.38 L

## 2014-09-23 ENCOUNTER — Other Ambulatory Visit (HOSPITAL_COMMUNITY): Payer: Self-pay | Admitting: Respiratory Therapy

## 2014-09-25 ENCOUNTER — Other Ambulatory Visit (HOSPITAL_COMMUNITY): Payer: Self-pay | Admitting: Respiratory Therapy

## 2014-09-28 NOTE — Sleep Study (Signed)
  [place] A. Merlene Laughter, MD     [email or web address]             NOCTURNAL POLYSOMNOGRAPHY   LOCATION: [facility]  Patient Name: Travis Booker, Travis Booker Date: 09/18/2014 Gender: Male D.O.B: May 09, 1950 Age (years): 64 Referring Provider: Asencion Noble Height (inches): 72 Interpreting Physician: Phillips Odor MD, ABSM Weight (lbs): 221 RPSGT: Rosebud Poles BMI: 30 MRN: [id number] Neck Size: 18.00 CLINICAL INFORMATION Sleep Study Type: NPSG  Indication for sleep study: Witnessed Apneas  Epworth Sleepiness Score: 4  SLEEP STUDY TECHNIQUE As per the AASM Manual for the Scoring of Sleep and Associated Events v2.3 (April 2016) with a hypopnea requiring 4% desaturations.  The channels recorded and monitored were frontal, central and occipital EEG, electrooculogram (EOG), submentalis EMG (chin), nasal and oral airflow, thoracic and abdominal wall motion, anterior tibialis EMG, snore microphone, electrocardiogram, and pulse oximetry.  MEDICATIONS Patient's medications include: N/A. Medications self-administered by patient during sleep study : Sleep medicine administered - Xanax at 10:08:51 PM  SLEEP ARCHITECTURE The study was initiated at 11:06:33 PM and ended at 5:35:11 AM.  Sleep onset time was 1.6 minutes and the sleep efficiency was 98.3%. The total sleep time was 382.0 minutes.  Stage REM latency was 101.5 minutes.  The patient spent 1.18% of the night in stage N1 sleep, 89.27% in stage N2 sleep, 0.00% in stage N3 and 9.56% in REM.  Alpha intrusion was absent.  Supine sleep was 59.16%.  RESPIRATORY PARAMETERS The overall apnea/hypopnea index (AHI) was 10.1 per hour. There were 48 total apneas, including 22 obstructive, 16 central and 10 mixed apneas. There were 16 hypopneas and 0 RERAs.  The AHI during Stage REM sleep was 23.0 per hour.  AHI while supine was 13.0 per hour.  The mean oxygen saturation was 89.44%. The minimum SpO2 during sleep was  83.00%.  Loud snoring was noted during this study.  CARDIAC DATA The 2 lead EKG demonstrated sinus rhythm. The mean heart rate was 50.30 beats per minute.  LEG MOVEMENT DATA The total PLMS were 0 with a resulting PLMS index of 0.00. Associated arousal with leg movement index was 0.0 .  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 Suggest formal CPAP titration study.      Delano Metz, MD Diplomate, American Board of Sleep Medicine.

## 2014-10-02 ENCOUNTER — Other Ambulatory Visit (HOSPITAL_COMMUNITY): Payer: Self-pay | Admitting: Respiratory Therapy

## 2014-10-22 DIAGNOSIS — F419 Anxiety disorder, unspecified: Secondary | ICD-10-CM | POA: Diagnosis not present

## 2014-10-22 DIAGNOSIS — K219 Gastro-esophageal reflux disease without esophagitis: Secondary | ICD-10-CM | POA: Diagnosis not present

## 2014-10-22 DIAGNOSIS — I10 Essential (primary) hypertension: Secondary | ICD-10-CM | POA: Diagnosis not present

## 2014-10-22 DIAGNOSIS — Z125 Encounter for screening for malignant neoplasm of prostate: Secondary | ICD-10-CM | POA: Diagnosis not present

## 2014-10-22 DIAGNOSIS — Z79899 Other long term (current) drug therapy: Secondary | ICD-10-CM | POA: Diagnosis not present

## 2014-10-26 DIAGNOSIS — J449 Chronic obstructive pulmonary disease, unspecified: Secondary | ICD-10-CM | POA: Diagnosis not present

## 2014-10-26 DIAGNOSIS — G4730 Sleep apnea, unspecified: Secondary | ICD-10-CM | POA: Diagnosis not present

## 2014-10-26 DIAGNOSIS — I10 Essential (primary) hypertension: Secondary | ICD-10-CM | POA: Diagnosis not present

## 2014-10-26 DIAGNOSIS — Z6830 Body mass index (BMI) 30.0-30.9, adult: Secondary | ICD-10-CM | POA: Diagnosis not present

## 2014-10-26 DIAGNOSIS — Z23 Encounter for immunization: Secondary | ICD-10-CM | POA: Diagnosis not present

## 2015-01-12 ENCOUNTER — Other Ambulatory Visit: Payer: Self-pay

## 2015-01-12 MED ORDER — DILTIAZEM HCL ER COATED BEADS 240 MG PO CP24: 240.0000 mg | ORAL_CAPSULE | Freq: Every day | ORAL | Status: DC

## 2015-01-12 NOTE — Telephone Encounter (Signed)
Pt long over due for fu,1 month supply given

## 2015-02-04 ENCOUNTER — Encounter: Payer: Medicare Other | Admitting: Cardiology

## 2015-02-04 ENCOUNTER — Encounter: Payer: Self-pay | Admitting: Cardiology

## 2015-02-04 NOTE — Progress Notes (Signed)
Patient ID: Travis Booker, male   DOB: [birth year], 65 y.o.   MRN: [id number]    Encounter opened in error

## 2015-02-08 ENCOUNTER — Encounter: Payer: Medicare Other | Admitting: Adult Health

## 2015-02-08 ENCOUNTER — Encounter: Payer: Self-pay | Admitting: Adult Health

## 2015-02-08 NOTE — Progress Notes (Signed)
Cardiology Office Note   ERROR NO SHOW 

## 2015-02-22 ENCOUNTER — Other Ambulatory Visit: Payer: Self-pay

## 2015-02-22 MED ORDER — TRIAMTERENE-HCTZ 37.5-25 MG PO TABS: 1.0000 | ORAL_TABLET | Freq: Every day | ORAL | Status: DC

## 2015-03-18 ENCOUNTER — Institutional Professional Consult (permissible substitution): Payer: Medicare Other | Admitting: Pulmonary Disease

## 2015-03-19 DIAGNOSIS — I480 Paroxysmal atrial fibrillation: Secondary | ICD-10-CM | POA: Diagnosis not present

## 2015-03-19 DIAGNOSIS — R05 Cough: Secondary | ICD-10-CM | POA: Diagnosis not present

## 2015-03-19 DIAGNOSIS — M545 Low back pain: Secondary | ICD-10-CM | POA: Diagnosis not present

## 2015-04-05 ENCOUNTER — Other Ambulatory Visit: Payer: Self-pay

## 2015-07-02 DIAGNOSIS — I10 Essential (primary) hypertension: Secondary | ICD-10-CM | POA: Diagnosis not present

## 2015-07-02 DIAGNOSIS — I480 Paroxysmal atrial fibrillation: Secondary | ICD-10-CM | POA: Diagnosis not present

## 2015-07-02 DIAGNOSIS — M545 Low back pain: Secondary | ICD-10-CM | POA: Diagnosis not present

## 2015-07-23 DIAGNOSIS — Z79891 Long term (current) use of opiate analgesic: Secondary | ICD-10-CM | POA: Diagnosis not present

## 2015-07-23 DIAGNOSIS — Z79899 Other long term (current) drug therapy: Secondary | ICD-10-CM | POA: Diagnosis not present

## 2015-10-01 ENCOUNTER — Ambulatory Visit (INDEPENDENT_AMBULATORY_CARE_PROVIDER_SITE_OTHER): Payer: Medicare Other | Admitting: Cardiology

## 2015-10-01 ENCOUNTER — Encounter: Payer: Self-pay | Admitting: Cardiology

## 2015-10-01 VITALS — BP 136/85 | HR 56 | Ht 72.0 in | Wt 200.0 lb

## 2015-10-01 DIAGNOSIS — I10 Essential (primary) hypertension: Secondary | ICD-10-CM

## 2015-10-01 DIAGNOSIS — E785 Hyperlipidemia, unspecified: Secondary | ICD-10-CM

## 2015-10-01 DIAGNOSIS — G4733 Obstructive sleep apnea (adult) (pediatric): Secondary | ICD-10-CM

## 2015-10-01 DIAGNOSIS — R7309 Other abnormal glucose: Secondary | ICD-10-CM

## 2015-10-01 DIAGNOSIS — I4891 Unspecified atrial fibrillation: Secondary | ICD-10-CM

## 2015-10-01 NOTE — Progress Notes (Addendum)
Clinical Summary Mr. Bouchie is a 65 y.o.male seen today for follow up of the following medical problems  1. Afib  - on flecanide and dilt, on ASA. CHADS2 score is 1.  - episode yesterday AM feeling heart skipping, lasted most of the day. Otherwise no recent symptoms.  - compliant with meds.   2. HTN  - compliant with meds  3. Day time somnolence  - patient reports for several years  - reports history of snoring, his wife has told him she has witnessed apneic episodes  - never had a sleep study before,he did not schedule after our referal after last visit.    Past Medical History:  Diagnosis Date  . Cervical disc disease    surgery planned for 05/25/10 (Dr. Hal Neer)  . COPD (chronic obstructive pulmonary disease)   . Depression with anxiety   . DJD (degenerative joint disease)   . GERD (gastroesophageal reflux disease)   . HTN (hypertension)   . Paroxysmal atrial fibrillation    a. flecainide therapy;  b. echo 6/09: EF normal, mild LVH;   c. Myoview 7/09: EF 59%, no ischemia     No Known Allergies   Current Outpatient Prescriptions  Medication Sig Dispense Refill  . ALPRAZolam (XANAX) 1 MG tablet Take 1 mg by mouth at bedtime as needed. nerves    . aspirin 325 MG tablet Take 325 mg by mouth daily.      Marland Kitchen diltiazem (CARDIZEM CD) 240 MG 24 hr capsule Take 1 capsule (240 mg total) by mouth daily. 30 capsule 0  . escitalopram (LEXAPRO) 10 MG tablet Take 10 mg by mouth daily.      Marland Kitchen esomeprazole (NEXIUM) 40 MG capsule Take 40 mg by mouth daily at 12 noon.    . fish oil-omega-3 fatty acids 1000 MG capsule Take 1 g by mouth daily.    . flecainide (TAMBOCOR) 100 MG tablet Take 1 tablet (100 mg total) by mouth 2 (two) times daily. 90 tablet 2  . levalbuterol (XOPENEX) 0.63 MG/3ML nebulizer solution Take 3 mLs (0.63 mg total) by nebulization 3 (three) times daily. 90 mL 12  . losartan (COZAAR) 100 MG tablet Take 100 mg by mouth daily.    Marland Kitchen oxyCODONE-acetaminophen (PERCOCET)  5-325 MG per tablet Take 1 tablet by mouth every 4 (four) hours as needed. pain    . triamterene-hydrochlorothiazide (MAXZIDE-25) 37.5-25 MG tablet Take 1 tablet by mouth daily. 30 tablet 11  . VIAGRA 100 MG tablet Take 1 tablet by mouth once as needed. Erectile dysfunction     No current facility-administered medications for this visit.      Past Surgical History:  Procedure Laterality Date  . APPENDECTOMY    . CHOLECYSTECTOMY    . COLONOSCOPY  10/07/03   normal rectum, submucosal nodule in mid-descending colon c/w lipoma  . COLONOSCOPY  03/22/2011   Procedure: COLONOSCOPY;  Surgeon: Daneil Dolin, MD;  [facility];  Service: Endoscopy;  Laterality: N/A;  2:30  . fused C4, C5, C6     anterior approach  . lumbar back surgery    . TOTAL SHOULDER ARTHROPLASTY    . WRIST SURGERY       No Known Allergies    Family History  Problem Relation Age of Onset  . Cancer Mother   . Dementia Mother   . Heart attack Brother 47  . Colon cancer Neg Hx      Social History Mr. Danger reports that he quit smoking  about 2 years ago. His smoking use included Cigarettes. He has never used smokeless tobacco. Mr. Sebree reports that he drinks about 1.0 oz of alcohol per week .   Review of Systems CONSTITUTIONAL: No weight loss, fever, chills, weakness or fatigue.  HEENT: Eyes: No visual loss, blurred vision, double vision or yellow sclerae.No hearing loss, sneezing, congestion, runny nose or sore throat.  SKIN: No rash or itching.  CARDIOVASCULAR: per HPI RESPIRATORY: No shortness of breath, cough or sputum.  GASTROINTESTINAL: No anorexia, nausea, vomiting or diarrhea. No abdominal pain or blood.  GENITOURINARY: No burning on urination, no polyuria NEUROLOGICAL: No headache, dizziness, syncope, paralysis, ataxia, numbness or tingling in the extremities. No change in bowel or bladder control.  MUSCULOSKELETAL: No muscle, back pain, joint pain or stiffness.  LYMPHATICS: No  enlarged nodes. No history of splenectomy.  PSYCHIATRIC: No history of depression or anxiety.  ENDOCRINOLOGIC: No reports of sweating, cold or heat intolerance. No polyuria or polydipsia.  Marland Kitchen   Physical Examination Vitals:   10/01/15 0905  BP: 136/85  Pulse: (!) 56   Vitals:   10/01/15 0905  Weight: 200 lb (90.7 kg)  Height: 6' (1.829 m)    Gen: resting comfortably, no acute distress HEENT: no scleral icterus, pupils equal round and reactive, no palptable cervical adenopathy,  CV: RRR, no m/r/g, no jvd Resp: Clear to auscultation bilaterally GI: abdomen is soft, non-tender, non-distended, normal bowel sounds, no hepatosplenomegaly MSK: extremities are warm, no edema.  Skin: warm, no rash Neuro:  no focal deficits Psych: appropriate affect      Assessment and Plan  1. Afib  - isolated episode of palpitations since last visit, we will continue current meds CHADS2 score is 1, we will continue ASA  - EKG in clinic today shows NSR  2. HTN  - at goal, he will continue current meds   3. Day time somnolence  - several risk factors and symptoms suggestive of obstructive sleep apnea  - we will refer again to sleep medicine, encouraged him to make appointment.     F/u 1 year. Order annual labs     Arnoldo Lenis, M.D.

## 2015-10-01 NOTE — Patient Instructions (Signed)
Medication Instructions:  Continue all current medications.  Labwork:  CMET, Mg, CBC, TSH, FLP, HgA1c - orders given today.  Reminder:  Nothing to eat or drink after 12 midnight prior to labs.  Office will contact with results via phone or letter.    Testing/Procedures: None  Referrals:   Dr. Luan Pulling to evaluate for sleep apnea.   Follow-Up: Your physician wants you to follow up in:  1 year.  You will receive a reminder letter in the mail one-two months in advance.  If you don't receive a letter, please call our office to schedule the follow up appointment - .   Any Other Special Instructions Will Be Listed Below (If Applicable).  If you need a refill on your cardiac medications before your next appointment, please call your pharmacy.

## 2015-10-07 DIAGNOSIS — J449 Chronic obstructive pulmonary disease, unspecified: Secondary | ICD-10-CM | POA: Diagnosis not present

## 2015-10-07 DIAGNOSIS — M545 Low back pain: Secondary | ICD-10-CM | POA: Diagnosis not present

## 2015-10-07 DIAGNOSIS — I10 Essential (primary) hypertension: Secondary | ICD-10-CM | POA: Diagnosis not present

## 2015-10-07 DIAGNOSIS — I480 Paroxysmal atrial fibrillation: Secondary | ICD-10-CM | POA: Diagnosis not present

## 2016-01-07 DIAGNOSIS — I480 Paroxysmal atrial fibrillation: Secondary | ICD-10-CM | POA: Diagnosis not present

## 2016-01-07 DIAGNOSIS — I10 Essential (primary) hypertension: Secondary | ICD-10-CM | POA: Diagnosis not present

## 2016-01-07 DIAGNOSIS — G4733 Obstructive sleep apnea (adult) (pediatric): Secondary | ICD-10-CM | POA: Diagnosis not present

## 2016-01-07 DIAGNOSIS — Z23 Encounter for immunization: Secondary | ICD-10-CM | POA: Diagnosis not present

## 2016-03-12 ENCOUNTER — Other Ambulatory Visit: Payer: Self-pay | Admitting: Cardiology

## 2016-05-15 DIAGNOSIS — I480 Paroxysmal atrial fibrillation: Secondary | ICD-10-CM | POA: Diagnosis not present

## 2016-05-15 DIAGNOSIS — I10 Essential (primary) hypertension: Secondary | ICD-10-CM | POA: Diagnosis not present

## 2016-05-15 DIAGNOSIS — Z79899 Other long term (current) drug therapy: Secondary | ICD-10-CM | POA: Diagnosis not present

## 2016-05-30 DIAGNOSIS — I480 Paroxysmal atrial fibrillation: Secondary | ICD-10-CM | POA: Diagnosis not present

## 2016-09-18 DIAGNOSIS — M549 Dorsalgia, unspecified: Secondary | ICD-10-CM | POA: Diagnosis not present

## 2016-09-18 DIAGNOSIS — I480 Paroxysmal atrial fibrillation: Secondary | ICD-10-CM | POA: Diagnosis not present

## 2016-09-18 DIAGNOSIS — I10 Essential (primary) hypertension: Secondary | ICD-10-CM | POA: Diagnosis not present

## 2016-09-18 DIAGNOSIS — Z23 Encounter for immunization: Secondary | ICD-10-CM | POA: Diagnosis not present

## 2016-11-28 DIAGNOSIS — M5412 Radiculopathy, cervical region: Secondary | ICD-10-CM | POA: Diagnosis not present

## 2016-12-12 DIAGNOSIS — I480 Paroxysmal atrial fibrillation: Secondary | ICD-10-CM | POA: Diagnosis not present

## 2016-12-12 DIAGNOSIS — M25511 Pain in right shoulder: Secondary | ICD-10-CM | POA: Diagnosis not present

## 2016-12-12 DIAGNOSIS — I10 Essential (primary) hypertension: Secondary | ICD-10-CM | POA: Diagnosis not present

## 2016-12-28 ENCOUNTER — Ambulatory Visit (HOSPITAL_COMMUNITY)
Admission: RE | Admit: 2016-12-28 | Discharge: 2016-12-28 | Disposition: A | Discharge status: Home / Self Care | Payer: Medicare Other | Source: Ambulatory Visit | Attending: Internal Medicine | Admitting: Internal Medicine

## 2016-12-28 ENCOUNTER — Other Ambulatory Visit (HOSPITAL_COMMUNITY): Payer: Self-pay | Admitting: Internal Medicine

## 2016-12-28 DIAGNOSIS — R937 Abnormal findings on diagnostic imaging of other parts of musculoskeletal system: Secondary | ICD-10-CM | POA: Insufficient documentation

## 2016-12-28 DIAGNOSIS — M25511 Pain in right shoulder: Secondary | ICD-10-CM | POA: Diagnosis not present

## 2016-12-28 DIAGNOSIS — M19011 Primary osteoarthritis, right shoulder: Secondary | ICD-10-CM | POA: Insufficient documentation

## 2017-01-15 DIAGNOSIS — J42 Unspecified chronic bronchitis: Secondary | ICD-10-CM | POA: Diagnosis not present

## 2017-02-02 DIAGNOSIS — H9201 Otalgia, right ear: Secondary | ICD-10-CM | POA: Diagnosis not present

## 2017-02-02 DIAGNOSIS — J449 Chronic obstructive pulmonary disease, unspecified: Secondary | ICD-10-CM | POA: Diagnosis not present

## 2017-02-09 DIAGNOSIS — Q181 Preauricular sinus and cyst: Secondary | ICD-10-CM | POA: Diagnosis not present

## 2017-03-22 DIAGNOSIS — M5412 Radiculopathy, cervical region: Secondary | ICD-10-CM | POA: Diagnosis not present

## 2017-03-22 DIAGNOSIS — I480 Paroxysmal atrial fibrillation: Secondary | ICD-10-CM | POA: Diagnosis not present

## 2017-03-23 ENCOUNTER — Other Ambulatory Visit (HOSPITAL_COMMUNITY): Payer: Self-pay | Admitting: Internal Medicine

## 2017-03-23 DIAGNOSIS — M5412 Radiculopathy, cervical region: Secondary | ICD-10-CM

## 2017-03-23 DIAGNOSIS — H21522 Goniosynechiae, left eye: Secondary | ICD-10-CM

## 2017-03-27 DIAGNOSIS — Z01812 Encounter for preprocedural laboratory examination: Secondary | ICD-10-CM | POA: Diagnosis not present

## 2017-03-28 ENCOUNTER — Ambulatory Visit (HOSPITAL_COMMUNITY)
Admission: RE | Admit: 2017-03-28 | Discharge: 2017-03-28 | Disposition: A | Discharge status: Home / Self Care | Payer: Medicare Other | Source: Ambulatory Visit | Attending: Internal Medicine | Admitting: Internal Medicine

## 2017-03-28 DIAGNOSIS — M5412 Radiculopathy, cervical region: Secondary | ICD-10-CM | POA: Insufficient documentation

## 2017-03-28 DIAGNOSIS — H21522 Goniosynechiae, left eye: Secondary | ICD-10-CM | POA: Diagnosis not present

## 2017-03-28 DIAGNOSIS — M47812 Spondylosis without myelopathy or radiculopathy, cervical region: Secondary | ICD-10-CM | POA: Diagnosis not present

## 2017-03-28 DIAGNOSIS — M4802 Spinal stenosis, cervical region: Secondary | ICD-10-CM | POA: Diagnosis not present

## 2017-03-28 MED ORDER — GADOBENATE DIMEGLUMINE 529 MG/ML IV SOLN
20.0000 mL | Freq: Once | INTRAVENOUS | Status: AC | PRN
  Administered 2017-03-28: 20 mL via INTRAVENOUS

## 2017-03-30 DIAGNOSIS — M4712 Other spondylosis with myelopathy, cervical region: Secondary | ICD-10-CM | POA: Diagnosis not present

## 2017-04-08 ENCOUNTER — Other Ambulatory Visit: Payer: Self-pay | Admitting: Adult Health

## 2017-04-13 ENCOUNTER — Telehealth: Payer: Self-pay | Admitting: Cardiology

## 2017-04-13 DIAGNOSIS — Z01818 Encounter for other preprocedural examination: Secondary | ICD-10-CM | POA: Diagnosis not present

## 2017-04-13 NOTE — Telephone Encounter (Signed)
Jocelyn Lamer with [facility] called stating that patient is having a surgical procedure on 04/16/2017 with general anesthesia.  Patient is taking 325mg  of ASA. Wants to know if this can be stopped. Please call [phone] ext [phone].

## 2017-04-13 NOTE — Telephone Encounter (Signed)
Received call from Jocelyn Lamer [facility] . Dr. Carloyn Manner answered the question about the ASA

## 2017-04-16 DIAGNOSIS — I10 Essential (primary) hypertension: Secondary | ICD-10-CM | POA: Diagnosis not present

## 2017-04-16 DIAGNOSIS — J449 Chronic obstructive pulmonary disease, unspecified: Secondary | ICD-10-CM | POA: Diagnosis not present

## 2017-04-16 DIAGNOSIS — J440 Chronic obstructive pulmonary disease with acute lower respiratory infection: Secondary | ICD-10-CM | POA: Diagnosis not present

## 2017-04-16 DIAGNOSIS — M4802 Spinal stenosis, cervical region: Secondary | ICD-10-CM | POA: Diagnosis not present

## 2017-04-16 DIAGNOSIS — Z7982 Long term (current) use of aspirin: Secondary | ICD-10-CM | POA: Diagnosis not present

## 2017-04-16 DIAGNOSIS — I4891 Unspecified atrial fibrillation: Secondary | ICD-10-CM | POA: Diagnosis not present

## 2017-04-16 DIAGNOSIS — M4712 Other spondylosis with myelopathy, cervical region: Secondary | ICD-10-CM | POA: Diagnosis not present

## 2017-04-16 DIAGNOSIS — M1990 Unspecified osteoarthritis, unspecified site: Secondary | ICD-10-CM | POA: Diagnosis not present

## 2017-04-16 DIAGNOSIS — Z981 Arthrodesis status: Secondary | ICD-10-CM | POA: Diagnosis not present

## 2017-04-16 DIAGNOSIS — Z79899 Other long term (current) drug therapy: Secondary | ICD-10-CM | POA: Diagnosis not present

## 2017-04-16 DIAGNOSIS — K219 Gastro-esophageal reflux disease without esophagitis: Secondary | ICD-10-CM | POA: Diagnosis not present

## 2017-04-23 DIAGNOSIS — M47812 Spondylosis without myelopathy or radiculopathy, cervical region: Secondary | ICD-10-CM | POA: Diagnosis not present

## 2017-04-23 DIAGNOSIS — M4712 Other spondylosis with myelopathy, cervical region: Secondary | ICD-10-CM | POA: Diagnosis not present

## 2017-05-12 ENCOUNTER — Other Ambulatory Visit: Payer: Self-pay | Admitting: Cardiology

## 2017-06-12 ENCOUNTER — Other Ambulatory Visit: Payer: Self-pay | Admitting: Cardiology

## 2017-06-27 ENCOUNTER — Other Ambulatory Visit: Payer: Self-pay | Admitting: Cardiology

## 2017-08-21 ENCOUNTER — Encounter: Payer: Self-pay | Admitting: Cardiology

## 2017-08-30 ENCOUNTER — Ambulatory Visit: Payer: Self-pay | Admitting: Cardiology

## 2017-10-01 DIAGNOSIS — M47816 Spondylosis without myelopathy or radiculopathy, lumbar region: Secondary | ICD-10-CM | POA: Diagnosis not present

## 2017-10-01 DIAGNOSIS — M4712 Other spondylosis with myelopathy, cervical region: Secondary | ICD-10-CM | POA: Diagnosis not present

## 2017-10-02 ENCOUNTER — Ambulatory Visit (HOSPITAL_COMMUNITY)
Admission: RE | Admit: 2017-10-02 | Discharge: 2017-10-02 | Disposition: A | Discharge status: Home / Self Care | Payer: Medicare Other | Source: Ambulatory Visit | Attending: Nurse Practitioner | Admitting: Nurse Practitioner

## 2017-10-02 ENCOUNTER — Ambulatory Visit (HOSPITAL_COMMUNITY)
Admission: RE | Admit: 2017-10-02 | Discharge: 2017-10-02 | Disposition: A | Discharge status: Home / Self Care | Payer: Medicare Other | Source: Ambulatory Visit | Attending: Internal Medicine | Admitting: Internal Medicine

## 2017-10-02 ENCOUNTER — Other Ambulatory Visit (HOSPITAL_COMMUNITY): Payer: Self-pay | Admitting: Nurse Practitioner

## 2017-10-02 ENCOUNTER — Other Ambulatory Visit (HOSPITAL_COMMUNITY): Payer: Self-pay | Admitting: Internal Medicine

## 2017-10-02 DIAGNOSIS — M5136 Other intervertebral disc degeneration, lumbar region: Secondary | ICD-10-CM | POA: Diagnosis not present

## 2017-10-02 DIAGNOSIS — R05 Cough: Secondary | ICD-10-CM

## 2017-10-02 DIAGNOSIS — I700 Atherosclerosis of aorta: Secondary | ICD-10-CM | POA: Diagnosis not present

## 2017-10-02 DIAGNOSIS — M5031 Other cervical disc degeneration,  high cervical region: Secondary | ICD-10-CM | POA: Insufficient documentation

## 2017-10-02 DIAGNOSIS — J449 Chronic obstructive pulmonary disease, unspecified: Secondary | ICD-10-CM | POA: Insufficient documentation

## 2017-10-02 DIAGNOSIS — Z981 Arthrodesis status: Secondary | ICD-10-CM | POA: Insufficient documentation

## 2017-10-02 DIAGNOSIS — M542 Cervicalgia: Secondary | ICD-10-CM | POA: Diagnosis not present

## 2017-10-02 DIAGNOSIS — R059 Cough, unspecified: Secondary | ICD-10-CM

## 2017-10-02 DIAGNOSIS — M4712 Other spondylosis with myelopathy, cervical region: Secondary | ICD-10-CM

## 2017-10-02 DIAGNOSIS — M545 Low back pain: Secondary | ICD-10-CM | POA: Diagnosis not present

## 2017-10-02 DIAGNOSIS — M47816 Spondylosis without myelopathy or radiculopathy, lumbar region: Secondary | ICD-10-CM | POA: Diagnosis not present

## 2017-10-11 ENCOUNTER — Ambulatory Visit: Payer: Self-pay | Admitting: Cardiology

## 2017-10-11 NOTE — Progress Notes (Deleted)
Clinical Summary Travis Booker is a 67 y.o.male  seen today for follow up of the following medical problems  1. Afib  - on flecanide and dilt, on ASA. CHADS2 score is 1.  - episode yesterday AM feeling heart skipping, lasted most of the day. Otherwise no recent symptoms.  - compliant with meds.   2. HTN  - compliant with meds  3. Day time somnolence  - patient reports for several years  - reports history of snoring, his wife has told him she has witnessed apneic episodes  - never had a sleep study before,he did not schedule after our referal after last visit.     Past Medical History:  Diagnosis Date  . Cervical disc disease    surgery planned for 05/25/10 (Dr. Hal Neer)  . COPD (chronic obstructive pulmonary disease) ([place])   . Depression with anxiety   . DJD (degenerative joint disease)   . GERD (gastroesophageal reflux disease)   . HTN (hypertension)   . Paroxysmal atrial fibrillation (HCC)    a. flecainide therapy;  b. echo 6/09: EF normal, mild LVH;   c. Myoview 7/09: EF 59%, no ischemia     No Known Allergies   Current Outpatient Medications  Medication Sig Dispense Refill  . ALPRAZolam (XANAX) 1 MG tablet Take 1 mg by mouth at bedtime as needed. nerves    . aspirin 325 MG tablet Take 325 mg by mouth daily.      Marland Kitchen diltiazem (CARDIZEM CD) 240 MG 24 hr capsule Take 1 capsule (240 mg total) by mouth daily. 30 capsule 0  . escitalopram (LEXAPRO) 10 MG tablet Take 10 mg by mouth daily.      Marland Kitchen esomeprazole (NEXIUM) 40 MG capsule Take 40 mg by mouth daily at 12 noon.    . fish oil-omega-3 fatty acids 1000 MG capsule Take 1 g by mouth daily.    . flecainide (TAMBOCOR) 100 MG tablet Take 1 tablet (100 mg total) by mouth 2 (two) times daily. 90 tablet 2  . levalbuterol (XOPENEX) 0.63 MG/3ML nebulizer solution Take 3 mLs (0.63 mg total) by nebulization 3 (three) times daily. 90 mL 12  . oxyCODONE-acetaminophen (PERCOCET) 5-325 MG per tablet Take 1 tablet by mouth every 4  (four) hours as needed. pain    . triamterene-hydrochlorothiazide (MAXZIDE-25) 37.5-25 MG tablet TAKE 1 TABLET BY MOUTH ONCE A DAY. 7 tablet 0  . VIAGRA 100 MG tablet Take 1 tablet by mouth once as needed. Erectile dysfunction     No current facility-administered medications for this visit.      Past Surgical History:  Procedure Laterality Date  . APPENDECTOMY    . CHOLECYSTECTOMY    . COLONOSCOPY  10/07/03   normal rectum, submucosal nodule in mid-descending colon c/w lipoma  . COLONOSCOPY  03/22/2011   Procedure: COLONOSCOPY;  Surgeon: Daneil Dolin, MD;  [facility];  Service: Endoscopy;  Laterality: N/A;  2:30  . fused C4, C5, C6     anterior approach  . lumbar back surgery    . TOTAL SHOULDER ARTHROPLASTY    . WRIST SURGERY       No Known Allergies    Family History  Problem Relation Age of Onset  . Cancer Mother   . Dementia Mother   . Heart attack Brother 35  . Colon cancer Neg Hx      Social History Mr. Warshaw reports that he quit smoking about 4 years ago. His smoking use included cigarettes.  He has never used smokeless tobacco. Mr. Bily reports that he drinks about 2.0 standard drinks of alcohol per week.   Review of Systems CONSTITUTIONAL: No weight loss, fever, chills, weakness or fatigue.  HEENT: Eyes: No visual loss, blurred vision, double vision or yellow sclerae.No hearing loss, sneezing, congestion, runny nose or sore throat.  SKIN: No rash or itching.  CARDIOVASCULAR:  RESPIRATORY: No shortness of breath, cough or sputum.  GASTROINTESTINAL: No anorexia, nausea, vomiting or diarrhea. No abdominal pain or blood.  GENITOURINARY: No burning on urination, no polyuria NEUROLOGICAL: No headache, dizziness, syncope, paralysis, ataxia, numbness or tingling in the extremities. No change in bowel or bladder control.  MUSCULOSKELETAL: No muscle, back pain, joint pain or stiffness.  LYMPHATICS: No enlarged nodes. No history of splenectomy.    PSYCHIATRIC: No history of depression or anxiety.  ENDOCRINOLOGIC: No reports of sweating, cold or heat intolerance. No polyuria or polydipsia.  Marland Kitchen   Physical Examination There were no vitals filed for this visit. There were no vitals filed for this visit.  Gen: resting comfortably, no acute distress HEENT: no scleral icterus, pupils equal round and reactive, no palptable cervical adenopathy,  CV Resp: Clear to auscultation bilaterally GI: abdomen is soft, non-tender, non-distended, normal bowel sounds, no hepatosplenomegaly MSK: extremities are warm, no edema.  Skin: warm, no rash Neuro:  no focal deficits Psych: appropriate affect   Diagnostic Studies     Assessment and Plan   1. Afib  - isolated episode of palpitations since last visit, we will continue current meds CHADS2 score is 1, we will continue ASA  - EKG in clinic today shows NSR  2. HTN  - at goal, he will continue current meds   3. Day time somnolence  - several risk factors and symptoms suggestive of obstructive sleep apnea  - we will refer again to sleep medicine, encouraged him to make appointment.     F/u 1 year. Order annual labs     Arnoldo Lenis, M.D., F.A.C.C.

## 2017-10-24 ENCOUNTER — Other Ambulatory Visit (HOSPITAL_COMMUNITY): Payer: Self-pay | Admitting: Nurse Practitioner

## 2017-10-24 DIAGNOSIS — M4712 Other spondylosis with myelopathy, cervical region: Secondary | ICD-10-CM

## 2017-11-01 ENCOUNTER — Ambulatory Visit (HOSPITAL_COMMUNITY)
Admission: RE | Admit: 2017-11-01 | Discharge: 2017-11-01 | Disposition: A | Discharge status: Home / Self Care | Payer: Medicare Other | Source: Ambulatory Visit | Attending: Nurse Practitioner | Admitting: Nurse Practitioner

## 2017-11-01 DIAGNOSIS — M542 Cervicalgia: Secondary | ICD-10-CM | POA: Diagnosis not present

## 2017-11-01 DIAGNOSIS — M4712 Other spondylosis with myelopathy, cervical region: Secondary | ICD-10-CM | POA: Diagnosis not present

## 2017-11-29 DIAGNOSIS — M4712 Other spondylosis with myelopathy, cervical region: Secondary | ICD-10-CM | POA: Diagnosis not present

## 2018-01-25 DIAGNOSIS — I480 Paroxysmal atrial fibrillation: Secondary | ICD-10-CM | POA: Diagnosis not present

## 2018-01-25 DIAGNOSIS — J441 Chronic obstructive pulmonary disease with (acute) exacerbation: Secondary | ICD-10-CM | POA: Diagnosis not present

## 2018-01-25 DIAGNOSIS — Z23 Encounter for immunization: Secondary | ICD-10-CM | POA: Diagnosis not present

## 2018-02-26 DIAGNOSIS — M4712 Other spondylosis with myelopathy, cervical region: Secondary | ICD-10-CM | POA: Diagnosis not present

## 2018-07-18 ENCOUNTER — Ambulatory Visit: Payer: Self-pay | Admitting: *Deleted

## 2018-07-18 DIAGNOSIS — Z20822 Contact with and (suspected) exposure to covid-19: Secondary | ICD-10-CM

## 2018-07-18 NOTE — Telephone Encounter (Signed)
Message from Pauline Good sent at 07/18/2018 4:49 PM EDT  [facility] @ Dr Willey Blade [phone] office needing pt sched covid testing. Please call Pt at [phone] per Kenney Houseman

## 2018-07-19 ENCOUNTER — Other Ambulatory Visit: Payer: Self-pay

## 2018-07-19 DIAGNOSIS — R6889 Other general symptoms and signs: Secondary | ICD-10-CM | POA: Diagnosis not present

## 2018-07-19 DIAGNOSIS — Z20822 Contact with and (suspected) exposure to covid-19: Secondary | ICD-10-CM

## 2018-07-19 NOTE — Telephone Encounter (Signed)
Patient scheduled for COVID 19 test today at 2pm at [facility].  Testing protocol reviewed.

## 2018-07-19 NOTE — Addendum Note (Signed)
Addended by: Denyce Robert on: 07/19/2018 09:06 AM   Modules accepted: Orders

## 2018-07-25 LAB — NOVEL CORONAVIRUS, NAA: SARS-CoV-2, NAA: NOT DETECTED

## 2018-09-26 DIAGNOSIS — Z79899 Other long term (current) drug therapy: Secondary | ICD-10-CM | POA: Diagnosis not present

## 2018-09-26 DIAGNOSIS — J449 Chronic obstructive pulmonary disease, unspecified: Secondary | ICD-10-CM | POA: Diagnosis not present

## 2018-09-26 DIAGNOSIS — M25541 Pain in joints of right hand: Secondary | ICD-10-CM | POA: Diagnosis not present

## 2018-09-26 DIAGNOSIS — M961 Postlaminectomy syndrome, not elsewhere classified: Secondary | ICD-10-CM | POA: Diagnosis not present

## 2019-01-21 ENCOUNTER — Other Ambulatory Visit: Payer: Self-pay

## 2019-01-21 ENCOUNTER — Ambulatory Visit: Payer: Medicare Other | Attending: Internal Medicine

## 2019-01-21 DIAGNOSIS — Z20822 Contact with and (suspected) exposure to covid-19: Secondary | ICD-10-CM

## 2019-01-21 DIAGNOSIS — Z23 Encounter for immunization: Secondary | ICD-10-CM | POA: Diagnosis not present

## 2019-01-22 LAB — NOVEL CORONAVIRUS, NAA: SARS-CoV-2, NAA: NOT DETECTED

## 2019-02-09 IMAGING — MR MR CERVICAL SPINE W/O CM
4 of 5 series · 22 of 48 positions shown · non-contrast
Comparison: Cervical spine MRI 03/28/2017. Cervical radiographs
10/02/2017

CLINICAL DATA: Cervical spondylosis with myelopathy.  Neck pain

EXAM:
MRI CERVICAL SPINE WITHOUT CONTRAST
TECHNIQUE: Multiplanar, multisequence MR imaging of the cervical spine was
performed. No intravenous contrast was administered.

[Series 3: T2 · sagittal · 3.0mm · 0.49mm/px · 6 of 13 slices shown (1 of 2)]
[im 1/13]
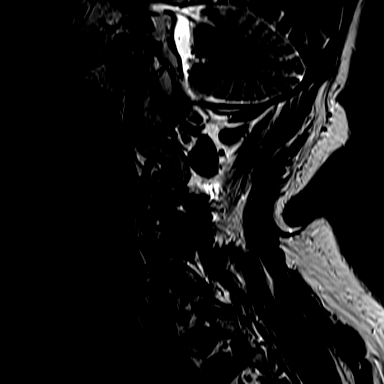
[im 3/13]
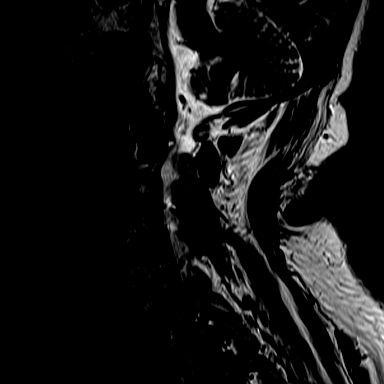
[im 5/13]
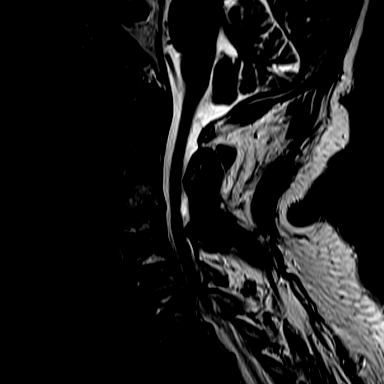
[im 8/13]
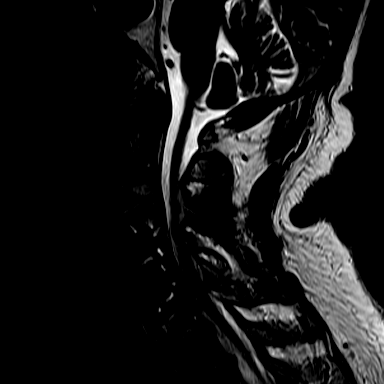
[im 10/13]
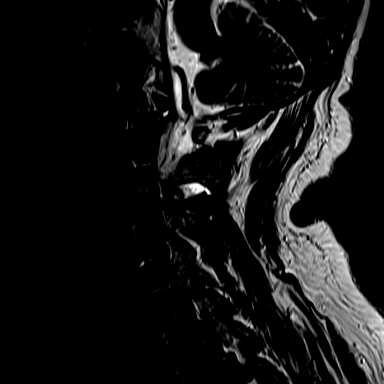
[im 13/13]
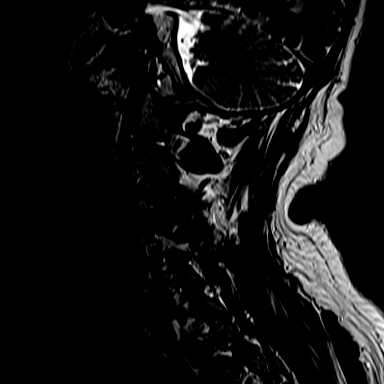

[Series 4: FLAIR · sagittal · 3.0mm · 0.49mm/px · 4 of 13 slices shown]
[im 1/13]
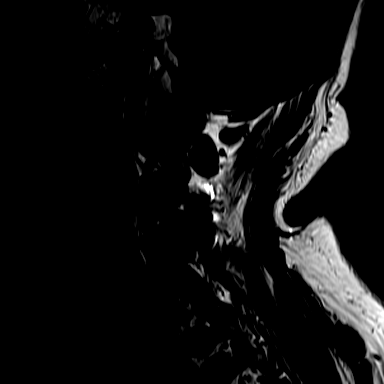
[im 3/13]
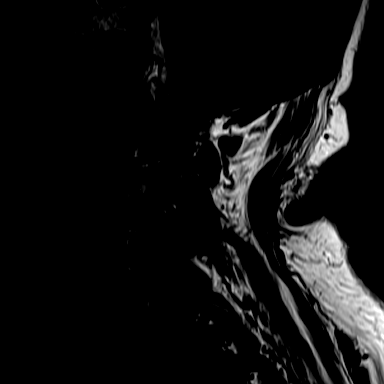
[im 8/13]
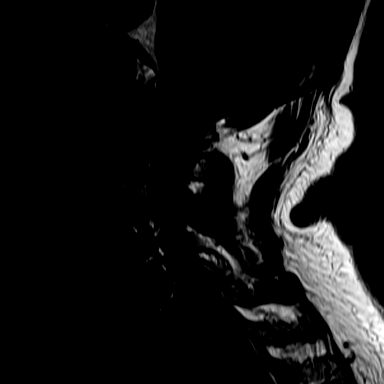
[im 13/13]
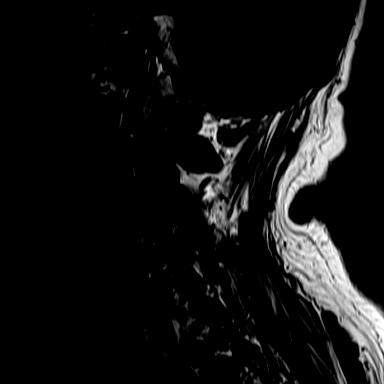

[Series 5: ir sagital · sagittal · 3.0mm · 0.29mm/px · 3 of 13 slices shown]
[im 3/13]
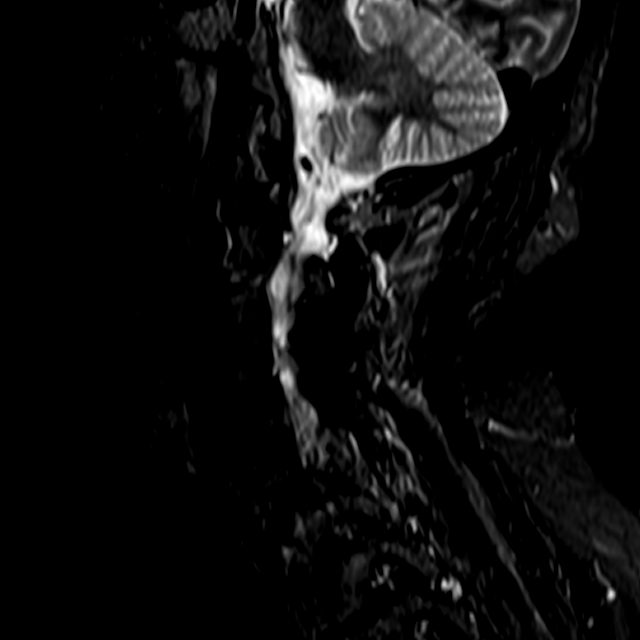
[im 8/13]
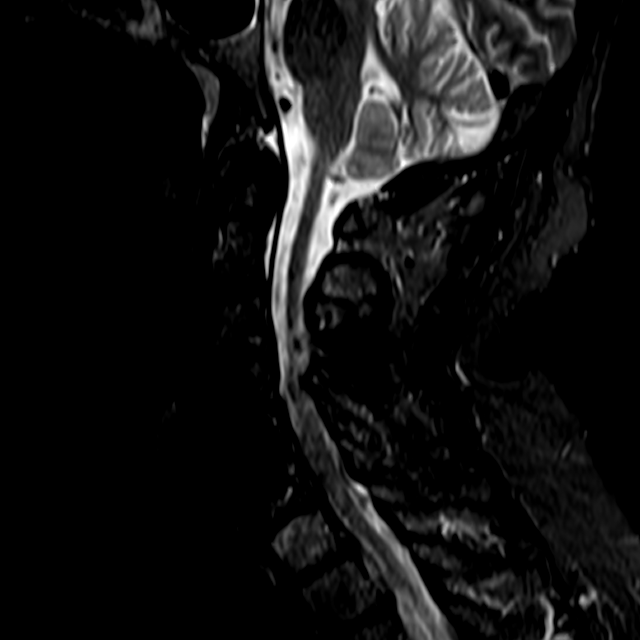
[im 13/13]
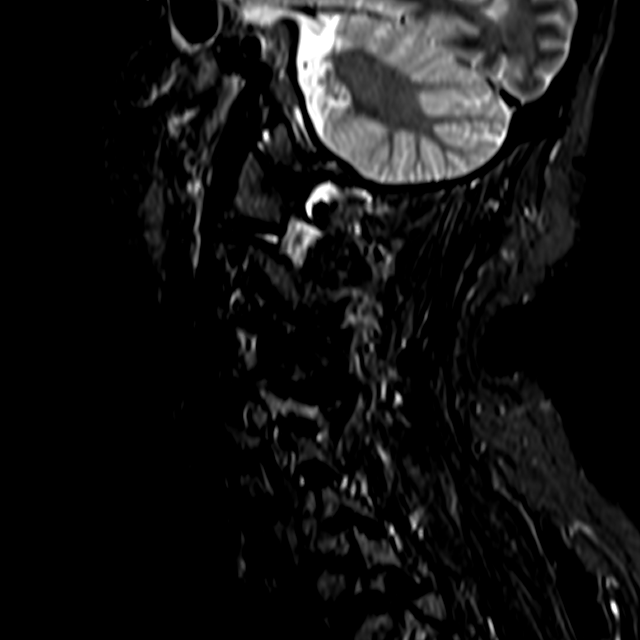

[Series 7: T2 · axial · 3.0mm · 0.23mm/px · z∈[-142,-35]mm · 9 of 34 slices shown (2 of 2)]
[im 1/34]
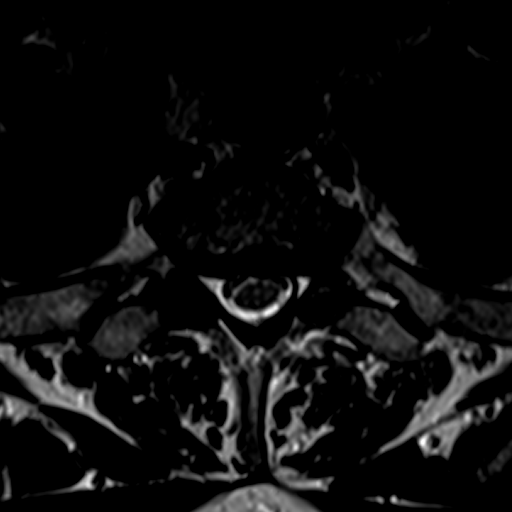
[im 5/34]
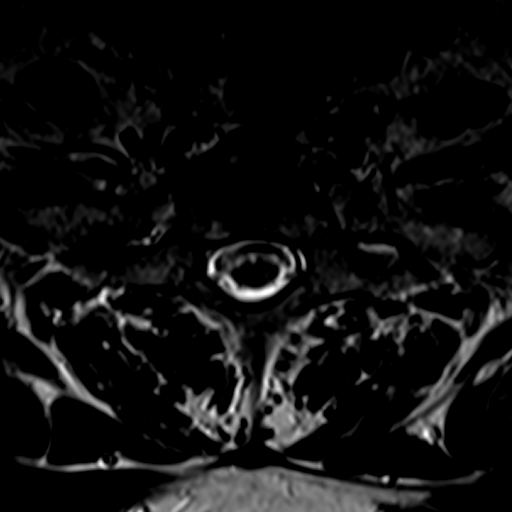
[im 10/34]
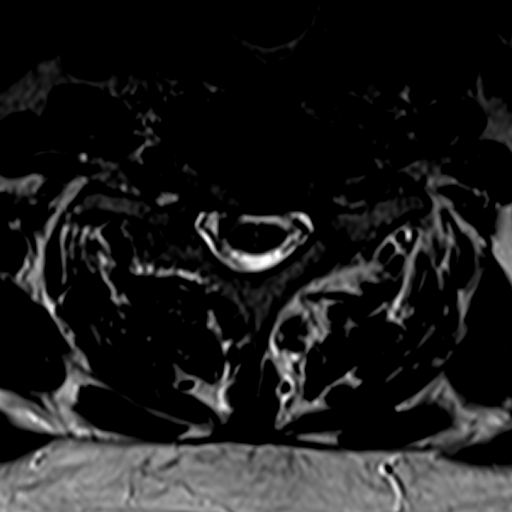
[im 15/34]
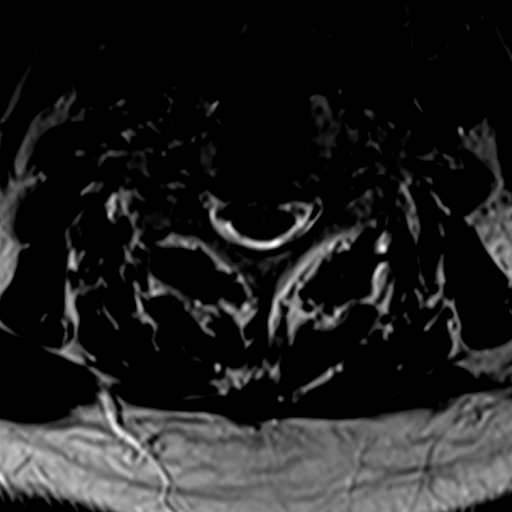
[im 17/34]
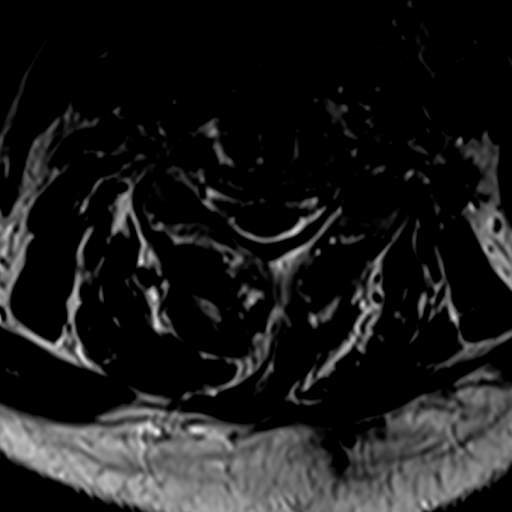
[im 19/34]
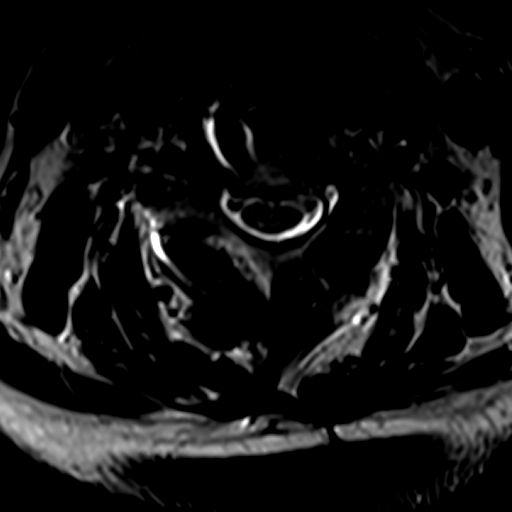
[im 24/34]
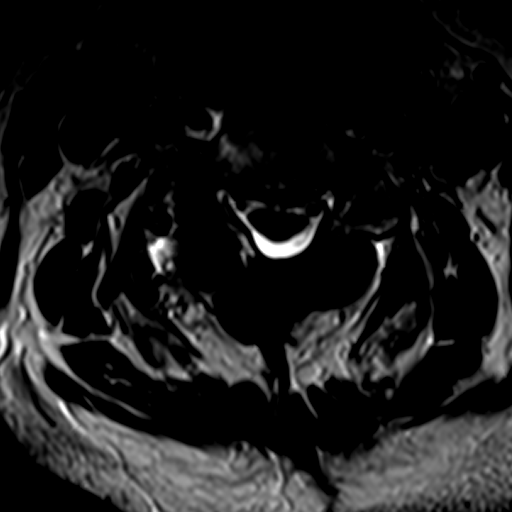
[im 29/34]
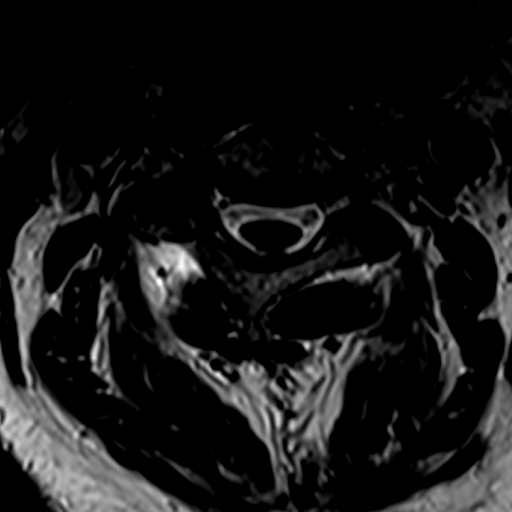
[im 34/34]
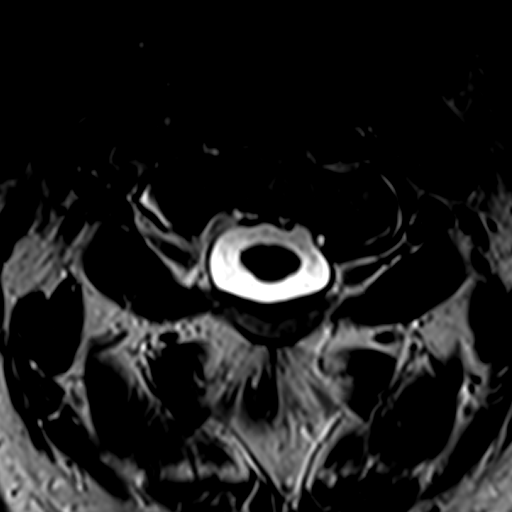

[22 of 48 positions shown; findings below may reference images not displayed]

FINDINGS: Alignment: Normal

Vertebrae: Negative for fracture or mass.

ACDF C4 through C6 with anterior plate and screws causing artifact.
Interval posterior hardware fusion and laminectomy at C3-4

Cord: Small cord hyperintensity on the left at C4-5 is unchanged
from the prior study.

Posterior Fossa, vertebral arteries, paraspinal tissues: Negative
for paraspinal mass or fluid collection. Visualized posterior fossa
negative.

Disc levels:

C2-3: Negative

C3-4: Interval laminectomy and posterior hardware fusion. Adequate
decompression of the spinal canal and neural foramina bilaterally
since the prior study. Negative for stenosis.

C4-5: ACDF. Mild spurring without significant stenosis. Small
hyperintensity in the cord on the left is unchanged.

C5-6: ACDF with adequate decompression of the canal. Moderate left
foraminal encroachment is unchanged from the prior study.

C6-7: Extensive disc degeneration with diffuse endplate spurring.
Moderate foraminal stenosis on the left appears unchanged. Right
foramen patent.

C7-T1: Moderate disc degeneration and spurring. Mild foraminal
encroachment bilaterally. Bilateral facet degeneration.
IMPRESSION: Interval laminectomy and posterior hardware fusion at C3-4 with
adequate decompression of the spinal canal and neural foramina
bilaterally.

ACDF C4-5 and C5-6 unchanged. Moderate left foraminal encroachment
at C5-6

Extensive disc degeneration and spurring at C6-7 with moderate
foraminal stenosis on the left unchanged.

Foraminal encroachment bilaterally at C7-T1 due to spurring is
unchanged.

## 2019-03-06 DIAGNOSIS — J441 Chronic obstructive pulmonary disease with (acute) exacerbation: Secondary | ICD-10-CM | POA: Diagnosis not present

## 2019-09-15 DIAGNOSIS — M5416 Radiculopathy, lumbar region: Secondary | ICD-10-CM | POA: Diagnosis not present

## 2019-10-07 ENCOUNTER — Ambulatory Visit: Payer: Medicare Other | Admitting: Cardiology

## 2019-11-11 DIAGNOSIS — Z23 Encounter for immunization: Secondary | ICD-10-CM | POA: Diagnosis not present

## 2019-11-13 ENCOUNTER — Ambulatory Visit: Payer: Medicare Other | Admitting: Cardiology

## 2019-11-13 ENCOUNTER — Other Ambulatory Visit: Payer: Self-pay

## 2019-11-13 ENCOUNTER — Encounter: Payer: Self-pay | Admitting: Cardiology

## 2019-11-13 VITALS — BP 148/76 | HR 68 | Ht 72.0 in | Wt 195.0 lb

## 2019-11-13 DIAGNOSIS — I4891 Unspecified atrial fibrillation: Secondary | ICD-10-CM | POA: Diagnosis not present

## 2019-11-13 DIAGNOSIS — I10 Essential (primary) hypertension: Secondary | ICD-10-CM

## 2019-11-13 MED ORDER — CHLORTHALIDONE 25 MG PO TABS: 12.5000 mg | ORAL_TABLET | Freq: Every day | ORAL | 3 refills | Status: DC

## 2019-11-13 MED ORDER — APIXABAN 5 MG PO TABS: 5.0000 mg | ORAL_TABLET | Freq: Two times a day (BID) | ORAL | 11 refills | Status: DC

## 2019-11-13 NOTE — Patient Instructions (Signed)
Medication Instructions:  STOP Aspirin   START Eliquis 5 mg twice a day   START Chlorthalidone 12.5 mg daily   *If you need a refill on your cardiac medications before your next appointment, please call your pharmacy*   Lab Work: BMET,magnesiun in 2 weeks ( 11/27/19)  If you have labs (blood work) drawn today and your tests are completely normal, you will receive your results only by: Marland Kitchen MyChart Message (if you have MyChart) OR . A paper copy in the mail If you have any lab test that is abnormal or we need to change your treatment, we will call you to review the results.   Testing/Procedures: None today   Follow-Up: At [facility], you and your health needs are our priority.  As part of our continuing mission to provide you with exceptional heart care, we have created designated Provider Care Teams.  These Care Teams include your primary Cardiologist (physician) and Advanced Practice Providers (APPs -  Physician Assistants and Nurse Practitioners) who all work together to provide you with the care you need, when you need it.  We recommend signing up for the patient portal called "MyChart".  Sign up information is provided on this After Visit Summary.  MyChart is used to connect with patients for Virtual Visits (Telemedicine).  Patients are able to view lab/test results, encounter notes, upcoming appointments, etc.  Non-urgent messages can be sent to your provider as well.   To learn more about what you can do with MyChart, go to [email or web address].    Your next appointment:   6 month(s)  The format for your next appointment:   In Person  Provider:   Carlyle Dolly, MD   Other Instructions Samples Eliquis 5 mg # 28, lot [id number], Exp 06/2021 Plus FREE 30 TRIAL card       Thank you for choosing [facility] !

## 2019-11-13 NOTE — Progress Notes (Signed)
Clinical Summary Travis Booker is a 69 y.o.male seen today as a new patient, over 4 years since his last visit with [place]. Seen today for the followig medical problems.   1. Afib  - on flecanide and dilt, on ASA. CHADS2 score is 1.  - episode yesterday AM feeling heart skipping, lasted most of the day. Otherwise no recent symptoms.  - compliant with meds.    - no recent palpitations - compliant with meds    2. HTN  - compliant with meds     Has had covid moderna shot x 3.  Past Medical History:  Diagnosis Date  . Cervical disc disease    surgery planned for 05/25/10 (Dr. Hal Neer)  . COPD (chronic obstructive pulmonary disease) ([place])   . Depression with anxiety   . DJD (degenerative joint disease)   . GERD (gastroesophageal reflux disease)   . HTN (hypertension)   . Paroxysmal atrial fibrillation (HCC)    a. flecainide therapy;  b. echo 6/09: EF normal, mild LVH;   c. Myoview 7/09: EF 59%, no ischemia     No Known Allergies   Current Outpatient Medications  Medication Sig Dispense Refill  . ALPRAZolam (XANAX) 1 MG tablet Take 1 mg by mouth at bedtime as needed. nerves    . aspirin 325 MG tablet Take 325 mg by mouth daily.      Marland Kitchen diltiazem (CARDIZEM CD) 240 MG 24 hr capsule Take 1 capsule (240 mg total) by mouth daily. 30 capsule 0  . escitalopram (LEXAPRO) 10 MG tablet Take 10 mg by mouth daily.      Marland Kitchen esomeprazole (NEXIUM) 40 MG capsule Take 40 mg by mouth daily at 12 noon.    . fish oil-omega-3 fatty acids 1000 MG capsule Take 1 g by mouth daily.    . flecainide (TAMBOCOR) 100 MG tablet Take 1 tablet (100 mg total) by mouth 2 (two) times daily. 90 tablet 2  . levalbuterol (XOPENEX) 0.63 MG/3ML nebulizer solution Take 3 mLs (0.63 mg total) by nebulization 3 (three) times daily. 90 mL 12  . oxyCODONE-acetaminophen (PERCOCET) 5-325 MG per tablet Take 1 tablet by mouth every 4 (four) hours as needed. pain    . triamterene-hydrochlorothiazide (MAXZIDE-25) 37.5-25 MG  tablet TAKE 1 TABLET BY MOUTH ONCE A DAY. 7 tablet 0  . VIAGRA 100 MG tablet Take 1 tablet by mouth once as needed. Erectile dysfunction     No current facility-administered medications for this visit.     Past Surgical History:  Procedure Laterality Date  . APPENDECTOMY    . CHOLECYSTECTOMY    . COLONOSCOPY  10/07/03   normal rectum, submucosal nodule in mid-descending colon c/w lipoma  . COLONOSCOPY  03/22/2011   Procedure: COLONOSCOPY;  Surgeon: Daneil Dolin, MD;  [facility];  Service: Endoscopy;  Laterality: N/A;  2:30  . fused C4, C5, C6     anterior approach  . lumbar back surgery    . TOTAL SHOULDER ARTHROPLASTY    . WRIST SURGERY       No Known Allergies    Family History  Problem Relation Age of Onset  . Cancer Mother   . Dementia Mother   . Heart attack Brother 69  . Colon cancer Neg Hx      Social History Travis Booker reports that he quit smoking about 6 years ago. His smoking use included cigarettes. He has never used smokeless tobacco. Travis Booker reports current alcohol use of  about 2.0 standard drinks of alcohol per week.   Review of Systems CONSTITUTIONAL: No weight loss, fever, chills, weakness or fatigue.  HEENT: Eyes: No visual loss, blurred vision, double vision or yellow sclerae.No hearing loss, sneezing, congestion, runny nose or sore throat.  SKIN: No rash or itching.  CARDIOVASCULAR: per hpi RESPIRATORY: No shortness of breath, cough or sputum.  GASTROINTESTINAL: No anorexia, nausea, vomiting or diarrhea. No abdominal pain or blood.  GENITOURINARY: No burning on urination, no polyuria NEUROLOGICAL: No headache, dizziness, syncope, paralysis, ataxia, numbness or tingling in the extremities. No change in bowel or bladder control.  MUSCULOSKELETAL: No muscle, back pain, joint pain or stiffness.  LYMPHATICS: No enlarged nodes. No history of splenectomy.  PSYCHIATRIC: No history of depression or anxiety.  ENDOCRINOLOGIC: No reports of  sweating, cold or heat intolerance. No polyuria or polydipsia.  Marland Kitchen   Physical Examination Today's Vitals   11/13/19 1439  BP: (!) 148/76  Pulse: 68  SpO2: 95%  Weight: 195 lb (88.5 kg)  Height: 6' (1.829 m)   Body mass index is 26.45 kg/m.  Gen: resting comfortably, no acute distress HEENT: no scleral icterus, pupils equal round and reactive, no palptable cervical adenopathy,  CV: RRR, no m/r/g,no jvd Resp: Clear to auscultation bilaterally GI: abdomen is soft, non-tender, non-distended, normal bowel sounds, no hepatosplenomegaly MSK: extremities are warm, no edema.  Skin: warm, no rash Neuro:  no focal deficits Psych: appropriate affect     Assessment and Plan  1. Afib  -no symptoms, EKG today shows NSR - CHADS2Vasc score is now 2 (HTN, age). We will d/c aspirin, start eliquis 5mg  bid  2. HTN  -above goal, start chlrothalidone 12.5mg  daily - check BMET/Mg in 2 weeks  F.u 6 months    Arnoldo Lenis, M.D.

## 2019-12-05 ENCOUNTER — Other Ambulatory Visit: Payer: Self-pay

## 2019-12-05 ENCOUNTER — Other Ambulatory Visit (HOSPITAL_COMMUNITY)
Admission: RE | Admit: 2019-12-05 | Discharge: 2019-12-05 | Disposition: A | Discharge status: Home / Self Care | Payer: Medicare Other | Source: Ambulatory Visit | Attending: Cardiology | Admitting: Cardiology

## 2019-12-05 DIAGNOSIS — I4891 Unspecified atrial fibrillation: Secondary | ICD-10-CM | POA: Insufficient documentation

## 2019-12-05 LAB — BASIC METABOLIC PANEL
Anion gap: 11 (ref 5–15)
BUN: 11 mg/dL (ref 8–23)
CO2: 29 mmol/L (ref 22–32)
Calcium: 8.8 mg/dL — ABNORMAL LOW (ref 8.9–10.3)
Chloride: 97 mmol/L — ABNORMAL LOW (ref 98–111)
Creatinine, Ser: 0.93 mg/dL (ref 0.61–1.24)
GFR, Estimated: >60 mL/min (ref >60)
Glucose, Bld: 103 mg/dL — ABNORMAL HIGH (ref 70–99)
Potassium: 3.3 mmol/L — ABNORMAL LOW (ref 3.5–5.1)
Sodium: 137 mmol/L (ref 135–145)

## 2019-12-05 LAB — MAGNESIUM: Magnesium: 2.1 mg/dL (ref 1.7–2.4)

## 2019-12-09 ENCOUNTER — Telehealth: Payer: Self-pay | Admitting: *Deleted

## 2019-12-09 MED ORDER — POTASSIUM CHLORIDE CRYS ER 20 MEQ PO TBCR: 40.0000 meq | EXTENDED_RELEASE_TABLET | Freq: Every day | ORAL | 1 refills | Status: DC

## 2019-12-09 NOTE — Telephone Encounter (Signed)
-----   Message from Arnoldo Lenis, MD sent at 12/08/2019  9:08 AM EST ----- Potassium a little low, can he start KCl 60mEq daily.   Zandra Abts MD

## 2019-12-09 NOTE — Telephone Encounter (Signed)
Pt voiced understanding - KCI sent to pharmacy

## 2020-03-05 DIAGNOSIS — M5400 Panniculitis affecting regions of neck and back, site unspecified: Secondary | ICD-10-CM | POA: Diagnosis not present

## 2020-03-05 DIAGNOSIS — M542 Cervicalgia: Secondary | ICD-10-CM | POA: Diagnosis not present

## 2020-03-05 DIAGNOSIS — Z720 Tobacco use: Secondary | ICD-10-CM | POA: Diagnosis not present

## 2020-03-05 DIAGNOSIS — Z23 Encounter for immunization: Secondary | ICD-10-CM | POA: Diagnosis not present

## 2020-03-08 ENCOUNTER — Other Ambulatory Visit: Payer: Self-pay | Admitting: Internal Medicine

## 2020-03-08 DIAGNOSIS — Z720 Tobacco use: Secondary | ICD-10-CM

## 2020-03-15 ENCOUNTER — Other Ambulatory Visit (HOSPITAL_COMMUNITY): Payer: Self-pay | Admitting: Internal Medicine

## 2020-03-15 DIAGNOSIS — M25561 Pain in right knee: Secondary | ICD-10-CM

## 2020-03-17 ENCOUNTER — Ambulatory Visit (HOSPITAL_COMMUNITY)
Admission: RE | Admit: 2020-03-17 | Discharge: 2020-03-17 | Disposition: A | Discharge status: Home / Self Care | Payer: Medicare Other | Source: Ambulatory Visit | Attending: Internal Medicine | Admitting: Internal Medicine

## 2020-03-17 ENCOUNTER — Other Ambulatory Visit: Payer: Self-pay

## 2020-03-17 DIAGNOSIS — M25561 Pain in right knee: Secondary | ICD-10-CM | POA: Diagnosis not present

## 2020-03-31 ENCOUNTER — Ambulatory Visit (HOSPITAL_COMMUNITY)
Admission: RE | Admit: 2020-03-31 | Discharge: 2020-03-31 | Disposition: A | Discharge status: Home / Self Care | Payer: Medicare Other | Source: Ambulatory Visit | Attending: Internal Medicine | Admitting: Internal Medicine

## 2020-03-31 ENCOUNTER — Encounter: Payer: Self-pay | Admitting: Orthopaedic Surgery

## 2020-03-31 ENCOUNTER — Other Ambulatory Visit: Payer: Self-pay

## 2020-03-31 ENCOUNTER — Ambulatory Visit (INDEPENDENT_AMBULATORY_CARE_PROVIDER_SITE_OTHER): Payer: Medicare Other | Admitting: Orthopaedic Surgery

## 2020-03-31 DIAGNOSIS — M25561 Pain in right knee: Secondary | ICD-10-CM | POA: Diagnosis not present

## 2020-03-31 DIAGNOSIS — J439 Emphysema, unspecified: Secondary | ICD-10-CM | POA: Diagnosis not present

## 2020-03-31 DIAGNOSIS — F17210 Nicotine dependence, cigarettes, uncomplicated: Secondary | ICD-10-CM | POA: Diagnosis not present

## 2020-03-31 DIAGNOSIS — Z720 Tobacco use: Secondary | ICD-10-CM

## 2020-03-31 DIAGNOSIS — Z122 Encounter for screening for malignant neoplasm of respiratory organs: Secondary | ICD-10-CM | POA: Insufficient documentation

## 2020-03-31 DIAGNOSIS — I700 Atherosclerosis of aorta: Secondary | ICD-10-CM | POA: Insufficient documentation

## 2020-03-31 DIAGNOSIS — G8929 Other chronic pain: Secondary | ICD-10-CM

## 2020-03-31 MED ORDER — BUPIVACAINE HCL 0.25 % IJ SOLN
2.0000 mL | INTRAMUSCULAR | Status: AC | PRN
  Administered 2020-03-31: 2 mL via INTRA_ARTICULAR

## 2020-03-31 MED ORDER — LIDOCAINE HCL 1 % IJ SOLN
2.0000 mL | INTRAMUSCULAR | Status: AC | PRN
  Administered 2020-03-31: 2 mL

## 2020-03-31 NOTE — Progress Notes (Signed)
Office Visit Note   Patient: Travis Booker           Date of Birth: 1950-10-06           MRN: [id number] Visit Date: 03/31/2020              Requested by: Asencion Noble, MD [id number] [place],  [place] [address] PCP: Asencion Noble, MD   Assessment & Plan: Visit Diagnoses:  1. Chronic pain of right knee     Plan: Mr. Cotto has significant arthritis of the right knee with bone-on-bone in the lateral compartment.  Long discussion regarding treatment options.  He does have a support that he can wear.  Will inject the knee with cortisone and monitor response.  In the future he could be a candidate for viscosupplementation and even possibly knee replacement.  Follow-Up Instructions: Return if symptoms worsen or fail to improve.   Orders:  Orders Placed This Encounter  Procedures  . Large Joint Inj: R knee   No orders of the defined types were placed in this encounter.     Procedures: Large Joint Inj: R knee on 03/31/2020 3:55 PM Indications: pain and diagnostic evaluation Details: 25 G 1.5 in needle, anterolateral approach  Arthrogram: No  Medications: 2 mL lidocaine 1 %; 2 mL bupivacaine 0.25 %  12 mg Depo-Medrol injected the lateral compartment right knee with Xylocaine and Marcaine Procedure, treatment alternatives, risks and benefits explained, specific risks discussed. Consent was given by the patient. Immediately prior to procedure a time out was called to verify the correct patient, procedure, equipment, support staff and site/side marked as required. Patient was prepped and draped in the usual sterile fashion.       Clinical Data: No additional findings.   Subjective: Chief Complaint  Patient presents with  . Right Knee - Pain  Patient presents today for right knee pain. He states that he slipped on the ice several months ago. He has tried ice and heat and that does offer some relief. He takes Percocet for pain relief.  Films of his right knee  were reviewed on the PACS system.  There is bone-on-bone in the lateral compartment with subchondral sclerosis and peripheral osteophytes.  Looks like there is probably 6 to 7 degrees of valgus.  Also has degenerative changes in the medial compartment and about the patellofemoral joint as well.  No loose bodies or ectopic calcification  HPI  Review of Systems   Objective: Vital Signs: BP (!) 154/81   Pulse (!) 53   Ht 6' (1.829 m)   Wt 198 lb (89.8 kg)   BMI 26.85 kg/m   Physical Exam Constitutional:      Appearance: He is well-developed and well-nourished.  HENT:     Mouth/Throat:     Mouth: Oropharynx is clear and moist.  Eyes:     Extraocular Movements: EOM normal.     Pupils: Pupils are equal, round, and reactive to light.  Pulmonary:     Effort: Pulmonary effort is normal.  Skin:    General: Skin is warm and dry.  Neurological:     Mental Status: He is alert and oriented to person, place, and time.  Psychiatric:        Mood and Affect: Mood and affect normal.        Behavior: Behavior normal.     Ortho Exam right knee with very minimal effusion.  There is increased valgus with weightbearing.  No opening with varus or  valgus stressing negative anterior drawer sign does have diffuse lateral joint tenderness that is relatively mild.  Lacks a few degrees to full extension of flexed over 100 degrees without instability.  Negative anterior drawer sign.  No popliteal mass or pain.  No calf pain.  Painless range of motion right hip.  Straight leg raise negative  Specialty Comments:  No specialty comments available.  Imaging: No results found.   PMFS History: Patient Active Problem List   Diagnosis Date Noted  . Pain in right knee 03/31/2020  . CAP (community acquired pneumonia) 09/18/2013  . Rectal bleeding 03/16/2011  . Loose stools 03/16/2011  . Chest pain, unspecified 05/17/2010  . Pre-operative cardiovascular examination 05/17/2010  . Tobacco abuse 05/17/2010   . ESSENTIAL HYPERTENSION, BENIGN 12/24/2007  . ATRIAL FIBRILLATION 12/24/2007  . COPD 12/24/2007  . GERD 12/24/2007   Past Medical History:  Diagnosis Date  . Cervical disc disease    surgery planned for 05/25/10 (Dr. Hal Neer)  . COPD (chronic obstructive pulmonary disease) ([place])   . Depression with anxiety   . DJD (degenerative joint disease)   . GERD (gastroesophageal reflux disease)   . HTN (hypertension)   . Paroxysmal atrial fibrillation ([facility])    a. flecainide therapy;  b. echo 6/09: EF normal, mild LVH;   c. Myoview 7/09: EF 59%, no ischemia    Family History  Problem Relation Age of Onset  . Cancer Mother   . Dementia Mother   . Heart attack Brother 84  . Colon cancer Neg Hx     Past Surgical History:  Procedure Laterality Date  . APPENDECTOMY    . CHOLECYSTECTOMY    . COLONOSCOPY  10/07/03   normal rectum, submucosal nodule in mid-descending colon c/w lipoma  . COLONOSCOPY  03/22/2011   Procedure: COLONOSCOPY;  Surgeon: Daneil Dolin, MD;  Location: [facility];  Service: Endoscopy;  Laterality: N/A;  2:30  . fused C4, C5, C6     anterior approach  . lumbar back surgery    . TOTAL SHOULDER ARTHROPLASTY    . WRIST SURGERY     Social History   Occupational History  . Occupation: IT sales professional: [employer]     Comment: [employer] in [place], [place]; been out of work 9 mos, trying to get disability due to chronic issues   Tobacco Use  . Smoking status: Current Every Day Smoker    Packs/day: 0.50    Types: Cigarettes  . Smokeless tobacco: Never Used  . Tobacco comment: 1 pack a day  Vaping Use  . Vaping Use: Never used  Substance and Sexual Activity  . Alcohol use: Yes    Alcohol/week: 2.0 standard drinks    Types: 2 Standard drinks or equivalent per week    Comment: 12 pack a week   . Drug use: No  . Sexual activity: Yes    Partners: Female

## 2020-04-28 ENCOUNTER — Other Ambulatory Visit: Payer: Self-pay | Admitting: Cardiology

## 2020-05-13 ENCOUNTER — Ambulatory Visit: Payer: Medicare Other | Admitting: Cardiology

## 2020-06-10 ENCOUNTER — Ambulatory Visit: Payer: Medicare Other | Admitting: Cardiology

## 2020-06-10 ENCOUNTER — Encounter: Payer: Self-pay | Admitting: Cardiology

## 2020-06-10 ENCOUNTER — Other Ambulatory Visit: Payer: Self-pay

## 2020-06-10 VITALS — BP 138/72 | HR 58 | Ht 72.0 in | Wt 195.0 lb

## 2020-06-10 DIAGNOSIS — I10 Essential (primary) hypertension: Secondary | ICD-10-CM | POA: Diagnosis not present

## 2020-06-10 DIAGNOSIS — I4891 Unspecified atrial fibrillation: Secondary | ICD-10-CM | POA: Diagnosis not present

## 2020-06-10 NOTE — Progress Notes (Signed)
Clinical Summary Travis Booker is a 70 y.o.male seen today for follow up of the following medical problems.    1. Afib  - on flecanide and dilt,eliquis - no recent palpitations - compliatnw with meds.No bleeding on eliquis.    2. HTN  - compliant with meds   Wife if Travis Booker also a patient of mine Past Medical History:  Diagnosis Date  . Cervical disc disease    surgery planned for 05/25/10 (Dr. Hal Neer)  . COPD (chronic obstructive pulmonary disease) ([place])   . Depression with anxiety   . DJD (degenerative joint disease)   . GERD (gastroesophageal reflux disease)   . HTN (hypertension)   . Paroxysmal atrial fibrillation ([facility])    a. flecainide therapy;  b. echo 6/09: EF normal, mild LVH;   c. Myoview 7/09: EF 59%, no ischemia     No Known Allergies   Current Outpatient Medications  Medication Sig Dispense Refill  . ALPRAZolam (XANAX) 1 MG tablet Take 1 mg by mouth at bedtime as needed. nerves    . apixaban (ELIQUIS) 5 MG TABS tablet Take 1 tablet (5 mg total) by mouth 2 (two) times daily. 60 tablet 11  . chlorthalidone (HYGROTON) 25 MG tablet Take 0.5 tablets (12.5 mg total) by mouth daily. 9045 tablet 3  . diltiazem (CARDIZEM CD) 240 MG 24 hr capsule Take 1 capsule (240 mg total) by mouth daily. 30 capsule 0  . escitalopram (LEXAPRO) 10 MG tablet Take 10 mg by mouth daily.    Marland Kitchen esomeprazole (NEXIUM) 40 MG capsule Take 40 mg by mouth daily at 12 noon.    . fish oil-omega-3 fatty acids 1000 MG capsule Take 1 g by mouth daily.    . flecainide (TAMBOCOR) 100 MG tablet Take 1 tablet (100 mg total) by mouth 2 (two) times daily. 90 tablet 2  . oxyCODONE-acetaminophen (PERCOCET) 5-325 MG per tablet Take 1 tablet by mouth every 4 (four) hours as needed. pain    . potassium chloride SA (KLOR-CON) 20 MEQ tablet TAKE 2 TABLETS DAILY. 180 tablet 3   No current facility-administered medications for this visit.     Past Surgical History:  Procedure Laterality Date  .  APPENDECTOMY    . CHOLECYSTECTOMY    . COLONOSCOPY  10/07/03   normal rectum, submucosal nodule in mid-descending colon c/w lipoma  . COLONOSCOPY  03/22/2011   Procedure: COLONOSCOPY;  Surgeon: Daneil Dolin, MD;  Location[facility];  Service: Endoscopy;  Laterality: N/A;  2:30  . fused C4, C5, C6     anterior approach  . lumbar back surgery    . TOTAL SHOULDER ARTHROPLASTY    . WRIST SURGERY       No Known Allergies    Family History  Problem Relation Age of Onset  . Cancer Mother   . Dementia Mother   . Heart attack Brother 12  . Colon cancer Neg Hx      Social History Travis Booker reports that he has been smoking cigarettes. He has been smoking about 0.50 packs per day. He has never used smokeless tobacco. Travis Booker reports current alcohol use of about 2.0 standard drinks of alcohol per week.   Review of Systems CONSTITUTIONAL: No weight loss, fever, chills, weakness or fatigue.  HEENT: Eyes: No visual loss, blurred vision, double vision or yellow sclerae.No hearing loss, sneezing, congestion, runny nose or sore throat.  SKIN: No rash or itching.  CARDIOVASCULAR: per hpi RESPIRATORY: No shortness of  breath, cough or sputum.  GASTROINTESTINAL: No anorexia, nausea, vomiting or diarrhea. No abdominal pain or blood.  GENITOURINARY: No burning on urination, no polyuria NEUROLOGICAL: No headache, dizziness, syncope, paralysis, ataxia, numbness or tingling in the extremities. No change in bowel or bladder control.  MUSCULOSKELETAL: No muscle, back pain, joint pain or stiffness.  LYMPHATICS: No enlarged nodes. No history of splenectomy.  PSYCHIATRIC: No history of depression or anxiety.  ENDOCRINOLOGIC: No reports of sweating, cold or heat intolerance. No polyuria or polydipsia.  Marland Kitchen   Physical Examination Today's Vitals   06/10/20 1422  BP: 138/72  Pulse: (!) 58  SpO2: 95%  Weight: 195 lb (88.5 kg)  Height: 6' (1.829 m)   Body mass index is 26.45 kg/m.  Gen:  resting comfortably, no acute distress HEENT: no scleral icterus, pupils equal round and reactive, no palptable cervical adenopathy,  CV: RRR, no m/r/g no jvd Resp: Clear to auscultation bilaterally GI: abdomen is soft, non-tender, non-distended, normal bowel sounds, no hepatosplenomegaly MSK: extremities are warm, no edema.  Skin: warm, no rash Neuro:  no focal deficits Psych: appropriate affect   Assessment and Plan   1. Afib  -denies any symptoms -continue current meds including anticoag  2. HTN  -manual recheck 134/70, essentially right around goal - he will monitor home bp's and update [place], room to titrate chlorthalidone if needed     Arnoldo Lenis, M.D.

## 2020-06-10 NOTE — Patient Instructions (Signed)
Medication Instructions:  Your physician recommends that you continue on your current medications as directed. Please refer to the Current Medication list given to you today.  *If you need a refill on your cardiac medications before your next appointment, please call your pharmacy*   Lab Work: We have requested your most recent lab work from your primary care provider.  If you have labs (blood work) drawn today and your tests are completely normal, you will receive your results only by: Marland Kitchen MyChart Message (if you have MyChart) OR . A paper copy in the mail If you have any lab test that is abnormal or we need to change your treatment, we will call you to review the results.   Testing/Procedures: None   Follow-Up: At [facility], you and your health needs are our priority.  As part of our continuing mission to provide you with exceptional heart care, we have created designated Provider Care Teams.  These Care Teams include your primary Cardiologist (physician) and Advanced Practice Providers (APPs -  Physician Assistants and Nurse Practitioners) who all work together to provide you with the care you need, when you need it.  We recommend signing up for the patient portal called "MyChart".  Sign up information is provided on this After Visit Summary.  MyChart is used to connect with patients for Virtual Visits (Telemedicine).  Patients are able to view lab/test results, encounter notes, upcoming appointments, etc.  Non-urgent messages can be sent to your provider as well.   To learn more about what you can do with MyChart, go to [email or web address].    Your next appointment:   6 month(s)  The format for your next appointment:   In Person  Provider:   Carlyle Dolly, MD   Other Instructions Please keep a BP log for 1 week. Call our office at the end of the week and update [place] on your current readings.

## 2020-06-25 ENCOUNTER — Encounter (HOSPITAL_COMMUNITY): Payer: Self-pay | Admitting: *Deleted

## 2020-06-25 ENCOUNTER — Emergency Department (HOSPITAL_COMMUNITY)
Admission: EM | Admit: 2020-06-25 | Discharge: 2020-06-25 | Disposition: A | Discharge status: Home / Self Care | Payer: Medicare Other | Attending: Emergency Medicine | Admitting: Emergency Medicine

## 2020-06-25 ENCOUNTER — Other Ambulatory Visit: Payer: Self-pay

## 2020-06-25 ENCOUNTER — Emergency Department (HOSPITAL_COMMUNITY): Payer: Medicare Other

## 2020-06-25 DIAGNOSIS — J449 Chronic obstructive pulmonary disease, unspecified: Secondary | ICD-10-CM | POA: Diagnosis not present

## 2020-06-25 DIAGNOSIS — Z79899 Other long term (current) drug therapy: Secondary | ICD-10-CM | POA: Insufficient documentation

## 2020-06-25 DIAGNOSIS — S0083XA Contusion of other part of head, initial encounter: Secondary | ICD-10-CM | POA: Insufficient documentation

## 2020-06-25 DIAGNOSIS — Z7901 Long term (current) use of anticoagulants: Secondary | ICD-10-CM | POA: Insufficient documentation

## 2020-06-25 DIAGNOSIS — Z8679 Personal history of other diseases of the circulatory system: Secondary | ICD-10-CM | POA: Insufficient documentation

## 2020-06-25 DIAGNOSIS — S80211A Abrasion, right knee, initial encounter: Secondary | ICD-10-CM | POA: Insufficient documentation

## 2020-06-25 DIAGNOSIS — Z96619 Presence of unspecified artificial shoulder joint: Secondary | ICD-10-CM | POA: Diagnosis not present

## 2020-06-25 DIAGNOSIS — F17210 Nicotine dependence, cigarettes, uncomplicated: Secondary | ICD-10-CM | POA: Diagnosis not present

## 2020-06-25 DIAGNOSIS — R040 Epistaxis: Secondary | ICD-10-CM

## 2020-06-25 DIAGNOSIS — S80222A Blister (nonthermal), left knee, initial encounter: Secondary | ICD-10-CM | POA: Diagnosis not present

## 2020-06-25 DIAGNOSIS — W01198A Fall on same level from slipping, tripping and stumbling with subsequent striking against other object, initial encounter: Secondary | ICD-10-CM | POA: Insufficient documentation

## 2020-06-25 DIAGNOSIS — I10 Essential (primary) hypertension: Secondary | ICD-10-CM | POA: Insufficient documentation

## 2020-06-25 DIAGNOSIS — S0990XA Unspecified injury of head, initial encounter: Secondary | ICD-10-CM | POA: Diagnosis present

## 2020-06-25 DIAGNOSIS — W19XXXA Unspecified fall, initial encounter: Secondary | ICD-10-CM

## 2020-06-25 MED ORDER — OXYMETAZOLINE HCL 0.05 % NA SOLN
1.0000 | Freq: Once | NASAL | Status: AC
  Administered 2020-06-25: 1 via NASAL
  Filled 2020-06-25: qty 30

## 2020-06-25 NOTE — ED Provider Notes (Signed)
Emergency Medicine Provider Triage Evaluation Note  Travis Booker , a 70 y.o. male  was evaluated in triage.  Pt complains of mechanical fall that occurred earlier today.  States that he was getting gas and tripped and fell on concrete striking his head and left side of his face.  He has pain to his left periorbital area and nose.  He had a episode of bilateral epistaxis that spontaneously resolved. He also complains of abrasions to both knees.  He denies LOC, headache, visual changes or dizziness.  No visual changes.  He takes Eliquis daily for atrial fibrillation.  Review of Systems  Positive: Head trauma, facial pain, epistaxis Negative: LOC, vomiting, headache and dizziness  Physical Exam  BP (!) 181/76 (BP Location: Right Arm)   Pulse (!) 57   Temp 97.8 F (36.6 C) (Oral)   Resp 18   SpO2 95%  Gen:   Awake, no distress   Resp:  Normal effort, lungs clear to auscultation bilaterally MSK:   Moves extremities without difficulty.   Other:  Left periorbital tenderness and abrasion, dried blood to both naris.  Tenderness to the bridge of the nose.  No bony deformity.  Abrasions bilateral knees.  Medical Decision Making  Medically screening exam initiated at 1:41 PM.  Appropriate orders placed.  Travis Booker was informed that the remainder of the evaluation will be completed by another provider, this initial triage assessment does not replace that evaluation, and the importance of remaining in the ED until their evaluation is complete.  Patient here for evaluation of mechanical fall, anticoagulated with Eliquis for atrial fibrillation.  He will need further work-up in the emergency department.  Patient agreeable to plan.   Kem Parkinson, PA-C 06/25/20 1345    Margette Fast, MD 06/28/20 [phone]

## 2020-06-25 NOTE — Discharge Instructions (Signed)
The CT scans of your face and head today were reassuring.  As discussed, it is important that you avoid strenuous activity today.  Please return to the emergency department if you develop any sudden onset of severe headache, vomiting, confusion or unsteady on your feet.  Otherwise, follow-up with your primary care provider early next week for recheck.

## 2020-06-25 NOTE — ED Triage Notes (Signed)
Fell on cement , has facial injuyy, is on eliquis

## 2020-07-08 NOTE — ED Provider Notes (Signed)
[facility] EMERGENCY DEPARTMENT Provider Note   CSN: [id number] Arrival date & time: 06/25/20  1228     History No chief complaint on file.   Travis Booker is a 70 y.o. male.  HPI      Travis Booker is a 70 y.o. male with past medical history of COPD, hypertension, and paroxysmal atrial fibrillation, anticoagulated with Eliquis.  Who presents to the Emergency Department requesting evaluation of a mechanical fall that occurred prior to arrival.  He states that he was getting gas and tripped and fell onto concrete.  He struck the left side of his face and side of his head on the concrete.  He complains of pain around the left periorbital area and nose.  He had a brief single episode of bilateral epistaxis that spontaneously resolved.  He also complains of scrapes to both knees, but denies knee pain.  He denies headache, visual changes, dizziness, or loss of consciousness.   Past Medical History:  Diagnosis Date   Cervical disc disease    surgery planned for 05/25/10 (Dr. Hal Neer)   COPD (chronic obstructive pulmonary disease) ([place])    Depression with anxiety    DJD (degenerative joint disease)    GERD (gastroesophageal reflux disease)    HTN (hypertension)    Paroxysmal atrial fibrillation (H[facility])    a. flecainide therapy;  b. echo 6/09: EF normal, mild LVH;   c. Myoview 7/09: EF 59%, no ischemia    Patient Active Problem List   Diagnosis Date Noted   Pain in right knee 03/31/2020   CAP (community acquired pneumonia) 09/18/2013   Rectal bleeding 03/16/2011   Loose stools 03/16/2011   Chest pain, unspecified 05/17/2010   Pre-operative cardiovascular examination 05/17/2010   Tobacco abuse 05/17/2010   ESSENTIAL HYPERTENSION, BENIGN 12/24/2007   ATRIAL FIBRILLATION 12/24/2007   COPD 12/24/2007   GERD 12/24/2007    Past Surgical History:  Procedure Laterality Date   APPENDECTOMY     CHOLECYSTECTOMY     COLONOSCOPY  10/07/03   normal rectum,  submucosal nodule in mid-descending colon c/w lipoma   COLONOSCOPY  03/22/2011   Procedure: COLONOSCOPY;  Surgeon: Daneil Dolin, MD;  Location: [facility];  Service: Endoscopy;  Laterality: N/A;  2:30   fused C4, C5, C6     anterior approach   lumbar back surgery     TOTAL SHOULDER ARTHROPLASTY     WRIST SURGERY         Family History  Problem Relation Age of Onset   Cancer Mother    Dementia Mother    Heart attack Brother 3   Colon cancer Neg Hx     Social History   Tobacco Use   Smoking status: Every Day    Packs/day: 0.50    Pack years: 0.00    Types: Cigarettes   Smokeless tobacco: Never   Tobacco comments:    1 pack a day  Vaping Use   Vaping Use: Never used  Substance Use Topics   Alcohol use: Yes    Alcohol/week: 2.0 standard drinks    Types: 2 Standard drinks or equivalent per week    Comment: 12 pack a week    Drug use: No    Home Medications Prior to Admission medications   Medication Sig Start Date End Date Taking? Authorizing Provider  ALPRAZolam Duanne Moron) 1 MG tablet Take 1 mg by mouth at bedtime as needed. nerves    [provider]  apixaban (ELIQUIS) 5 MG TABS  tablet Take 1 tablet (5 mg total) by mouth 2 (two) times daily. 11/13/19   Arnoldo Lenis, MD  chlorthalidone (HYGROTON) 25 MG tablet Take 0.5 tablets (12.5 mg total) by mouth daily. 11/13/19 02/11/20  Arnoldo Lenis, MD  diltiazem (CARDIZEM CD) 240 MG 24 hr capsule Take 1 capsule (240 mg total) by mouth daily. 01/12/15   Arnoldo Lenis, MD  escitalopram (LEXAPRO) 10 MG tablet Take 10 mg by mouth daily.    [provider]  esomeprazole (NEXIUM) 40 MG capsule Take 40 mg by mouth daily at 12 noon.    [provider]  fish oil-omega-3 fatty acids 1000 MG capsule Take 1 g by mouth daily.    [provider]  flecainide (TAMBOCOR) 100 MG tablet Take 1 tablet (100 mg total) by mouth 2 (two) times daily. 10/16/12   Arnoldo Lenis, MD   oxyCODONE-acetaminophen (PERCOCET) 5-325 MG per tablet Take 1 tablet by mouth every 4 (four) hours as needed. pain    [provider]  potassium chloride SA (KLOR-CON) 20 MEQ tablet TAKE 2 TABLETS DAILY. 04/28/20   Arnoldo Lenis, MD    Allergies    Patient has no known allergies.  Review of Systems   Review of Systems  Constitutional:  Negative for chills, fatigue and fever.  HENT:  Positive for facial swelling and nosebleeds. Negative for sore throat and trouble swallowing.   Respiratory:  Negative for shortness of breath.   Cardiovascular:  Negative for chest pain and palpitations.  Gastrointestinal:  Negative for abdominal pain, nausea and vomiting.  Genitourinary:  Negative for dysuria, flank pain and hematuria.  Musculoskeletal:  Negative for arthralgias, back pain, myalgias, neck pain and neck stiffness.  Skin:  Negative for rash.       Abrasions both knees  Neurological:  Negative for dizziness, syncope, facial asymmetry, speech difficulty, weakness, numbness and headaches.  Hematological:  Does not bruise/bleed easily.  Psychiatric/Behavioral:  Negative for confusion.    Physical Exam Updated Vital Signs BP (!) 149/94 (BP Location: Right Arm)   Pulse (!) 58   Temp 98.1 F (36.7 C) (Oral)   Resp 18   SpO2 99%   Physical Exam Vitals and nursing note reviewed.  Constitutional:      General: He is not in acute distress.    Appearance: Normal appearance. He is not ill-appearing or toxic-appearing.  HENT:     Head: Normocephalic.     Comments: Diffuse tenderness to palpation of the left periorbital region.  Mild edema noted of the left cheek.  No open wounds.  Tenderness to the bridge of the nose without active epistaxis or edema.  No septal hematoma.    Right Ear: Tympanic membrane and ear canal normal.     Left Ear: Tympanic membrane and ear canal normal.     Mouth/Throat:     Mouth: Mucous membranes are moist.     Pharynx: Oropharynx is clear.  Eyes:      Extraocular Movements: Extraocular movements intact.     Conjunctiva/sclera: Conjunctivae normal.     Pupils: Pupils are equal, round, and reactive to light.  Neck:     Thyroid: No thyromegaly.     Meningeal: Kernig's sign absent.  Cardiovascular:     Rate and Rhythm: Normal rate and regular rhythm.     Pulses: Normal pulses.  Pulmonary:     Effort: Pulmonary effort is normal.     Breath sounds: Normal breath sounds. No wheezing.  Abdominal:  Palpations: Abdomen is soft.     Tenderness: There is no abdominal tenderness. There is no guarding or rebound.  Musculoskeletal:        General: Signs of injury present. No swelling, tenderness or deformity. Normal range of motion.     Cervical back: Normal range of motion and neck supple. No tenderness.     Comments: Patient has full range of motion of the bilateral knees.  No edema or palpable effusion.  Skin:    General: Skin is warm.     Capillary Refill: Capillary refill takes less than 2 seconds.     Findings: No rash.  Neurological:     General: No focal deficit present.     Mental Status: He is alert.     Sensory: No sensory deficit.     Motor: No weakness.    ED Results / Procedures / Treatments   Labs (all labs ordered are listed, but only abnormal results are displayed) Labs Reviewed - No data to display  EKG None  Radiology CT Head Wo Contrast  Result Date: 06/25/2020 CLINICAL DATA:  Head trauma, minor. Facial trauma. Additional history provided: Fall today with abrasion to left side of face and bloody nose. EXAM: CT HEAD WITHOUT CONTRAST CT MAXILLOFACIAL WITHOUT CONTRAST TECHNIQUE: Multidetector CT imaging of the head and maxillofacial structures were performed using the standard protocol without intravenous contrast. Multiplanar CT image reconstructions of the maxillofacial structures were also generated. COMPARISON:  Brain MRI 11/09/2010.  Head CT 07/23/2008. FINDINGS: CT HEAD FINDINGS Brain: Cerebral volume is normal  for age. There is no acute intracranial hemorrhage. No demarcated cortical infarct. No extra-axial fluid collection. No evidence of intracranial mass. No midline shift. Vascular: No hyperdense vessel. Skull: Normal. Negative for fracture or focal suspicious osseous lesion. Other: Trace left mastoid effusion. CT MAXILLOFACIAL FINDINGS Osseous: No acute maxillofacial fracture is identified. Orbits: No acute finding within the orbits. The globes are normal in size and contour. The extraocular muscles and optic nerve sheath complexes are symmetric and unremarkable. Sinuses: Tiny right maxillary sinus mucous retention cyst. Small right sphenoid sinus air-fluid level. Mild mucosal thickening and small volume fluid within the left frontal and left ethmoid sinuses. Soft tissues: No significant maxillofacial soft tissue swelling or hematoma appreciable by CT. Other: Partially imaged ACDF hardware within the cervical spine. IMPRESSION: CT head: 1. No evidence of acute intracranial abnormality. 2. Trace left mastoid effusion. CT maxillofacial: 1. No evidence of acute maxillofacial fracture. 2. Paranasal sinus disease, as described. Electronically Signed   By: Kellie Simmering DO   On: 06/25/2020 14:38   CT Maxillofacial Wo Contrast  Result Date: 06/25/2020 CLINICAL DATA:  Head trauma, minor. Facial trauma. Additional history provided: Fall today with abrasion to left side of face and bloody nose. EXAM: CT HEAD WITHOUT CONTRAST CT MAXILLOFACIAL WITHOUT CONTRAST TECHNIQUE: Multidetector CT imaging of the head and maxillofacial structures were performed using the standard protocol without intravenous contrast. Multiplanar CT image reconstructions of the maxillofacial structures were also generated. COMPARISON:  Brain MRI 11/09/2010.  Head CT 07/23/2008. FINDINGS: CT HEAD FINDINGS Brain: Cerebral volume is normal for age. There is no acute intracranial hemorrhage. No demarcated cortical infarct. No extra-axial fluid collection. No  evidence of intracranial mass. No midline shift. Vascular: No hyperdense vessel. Skull: Normal. Negative for fracture or focal suspicious osseous lesion. Other: Trace left mastoid effusion. CT MAXILLOFACIAL FINDINGS Osseous: No acute maxillofacial fracture is identified. Orbits: No acute finding within the orbits. The globes are normal in size and  contour. The extraocular muscles and optic nerve sheath complexes are symmetric and unremarkable. Sinuses: Tiny right maxillary sinus mucous retention cyst. Small right sphenoid sinus air-fluid level. Mild mucosal thickening and small volume fluid within the left frontal and left ethmoid sinuses. Soft tissues: No significant maxillofacial soft tissue swelling or hematoma appreciable by CT. Other: Partially imaged ACDF hardware within the cervical spine. IMPRESSION: CT head: 1. No evidence of acute intracranial abnormality. 2. Trace left mastoid effusion. CT maxillofacial: 1. No evidence of acute maxillofacial fracture. 2. Paranasal sinus disease, as described. Electronically Signed   By: Kellie Simmering DO   On: 06/25/2020 14:38     Procedures Procedures   Medications Ordered in ED Medications  oxymetazoline (AFRIN) 0.05 % nasal spray 1 spray (1 spray Each Nare Given 06/25/20 1644)    ED Course  I have reviewed the triage vital signs and the nursing notes.  Pertinent labs & imaging results that were available during my care of the patient were reviewed by me and considered in my medical decision making (see chart for details).    MDM Rules/Calculators/A&P                          Patient here for evaluation of facial injuries secondary to a mechanical fall that occurred earlier today.  Patient is anticoagulated on Eliquis due to history of paroxysmal A. fib.  No focal neurodeficits.  Injury of the nose without active epistaxis.  Will obtain CT head and maxillofacial.  On recheck, patient resting comfortably.  CT of the head and maxillofacial without  evidence of  intracranial abnormality and CT maxillofacial without evidence of fracture.  Patient did have dried blood to bilateral nares, Afrin applied.  Patient observed in the department without further complications or epistaxis.  He appears appropriate for discharge home, strict return precautions and head injury instructions given.  Patient verbalized understanding agreeable to plan.   Final Clinical Impression(s) / ED Diagnoses Final diagnoses:  Contusion of face, initial encounter  Epistaxis due to trauma  Fall, initial encounter    Rx / DC Orders ED Discharge Orders     None        Kem Parkinson, PA-C 07/08/20 1954    Daleen Bo, MD 07/10/20 1155

## 2020-07-21 ENCOUNTER — Other Ambulatory Visit: Payer: Self-pay | Admitting: Cardiology

## 2020-09-30 ENCOUNTER — Other Ambulatory Visit (HOSPITAL_COMMUNITY): Payer: Self-pay | Admitting: Internal Medicine

## 2020-09-30 DIAGNOSIS — M25511 Pain in right shoulder: Secondary | ICD-10-CM

## 2020-10-01 DIAGNOSIS — I480 Paroxysmal atrial fibrillation: Secondary | ICD-10-CM | POA: Diagnosis not present

## 2020-10-01 DIAGNOSIS — I700 Atherosclerosis of aorta: Secondary | ICD-10-CM | POA: Diagnosis not present

## 2020-10-01 DIAGNOSIS — I10 Essential (primary) hypertension: Secondary | ICD-10-CM | POA: Diagnosis not present

## 2020-10-05 DIAGNOSIS — R634 Abnormal weight loss: Secondary | ICD-10-CM | POA: Diagnosis not present

## 2020-10-05 DIAGNOSIS — Z79899 Other long term (current) drug therapy: Secondary | ICD-10-CM | POA: Diagnosis not present

## 2020-10-05 DIAGNOSIS — I4892 Unspecified atrial flutter: Secondary | ICD-10-CM | POA: Diagnosis not present

## 2020-10-05 DIAGNOSIS — K219 Gastro-esophageal reflux disease without esophagitis: Secondary | ICD-10-CM | POA: Diagnosis not present

## 2020-10-05 DIAGNOSIS — M25561 Pain in right knee: Secondary | ICD-10-CM | POA: Diagnosis not present

## 2020-10-05 DIAGNOSIS — I480 Paroxysmal atrial fibrillation: Secondary | ICD-10-CM | POA: Diagnosis not present

## 2020-10-05 DIAGNOSIS — I10 Essential (primary) hypertension: Secondary | ICD-10-CM | POA: Diagnosis not present

## 2020-10-07 ENCOUNTER — Ambulatory Visit: Payer: Medicare Other | Admitting: Cardiology

## 2020-10-07 ENCOUNTER — Encounter: Payer: Self-pay | Admitting: Cardiology

## 2020-10-07 VITALS — BP 110/80 | HR 51 | Ht 72.0 in | Wt 178.2 lb

## 2020-10-07 DIAGNOSIS — I4892 Unspecified atrial flutter: Secondary | ICD-10-CM

## 2020-10-07 DIAGNOSIS — I4891 Unspecified atrial fibrillation: Secondary | ICD-10-CM | POA: Diagnosis not present

## 2020-10-07 MED ORDER — METOPROLOL TARTRATE 25 MG PO TABS: 12.5000 mg | ORAL_TABLET | Freq: Two times a day (BID) | ORAL | Status: DC

## 2020-10-07 NOTE — Patient Instructions (Signed)
Medication Instructions:  Decrease Lopressor to 12.'5mg'$  twice a day. Continue all other medications.     Labwork: none  Testing/Procedures: none  Follow-Up: 6 months    Any Other Special Instructions Will Be Listed Below (If Applicable). Please call the office in 1 week with update on BP & HR readings.   If you need a refill on your cardiac medications before your next appointment, please call your pharmacy.

## 2020-10-07 NOTE — Progress Notes (Signed)
Clinical Summary Travis Booker is a 70 y.o.male seen today for follow up of the following medical problems.      1. Afib - on flecanide and dilt,eliquis - no recent palpitations - compliatnw with meds.No bleeding on eliquis.    -noted to be in aflutter at recent pcp visit. PCP added metoprolol - isolated episode of heart racing 10/01/20. He could feel when it went out of rhythm, went back into normal that night.  - pcp started metoprolol   2. HTN  - compliant with meds     Wife if Travis Booker also a patient of mine Past Medical History:  Diagnosis Date   Cervical disc disease    surgery planned for 05/25/10 (Dr. Hal Neer)   COPD (chronic obstructive pulmonary disease) ([place])    Depression with anxiety    DJD (degenerative joint disease)    GERD (gastroesophageal reflux disease)    HTN (hypertension)    Paroxysmal atrial fibrillation ([place])    a. flecainide therapy;  b. echo 6/09: EF normal, mild LVH;   c. Myoview 7/09: EF 59%, no ischemia     No Known Allergies   Current Outpatient Medications  Medication Sig Dispense Refill   ALPRAZolam (XANAX) 1 MG tablet Take 1 mg by mouth at bedtime as needed. nerves     apixaban (ELIQUIS) 5 MG TABS tablet Take 1 tablet (5 mg total) by mouth 2 (two) times daily. 60 tablet 11   chlorthalidone (HYGROTON) 25 MG tablet TAKE 1/2 TABLET BY MOUTH ONCE DAILY. 45 tablet 2   diltiazem (CARDIZEM CD) 240 MG 24 hr capsule Take 1 capsule (240 mg total) by mouth daily. 30 capsule 0   escitalopram (LEXAPRO) 10 MG tablet Take 10 mg by mouth daily.     esomeprazole (NEXIUM) 40 MG capsule Take 40 mg by mouth daily at 12 noon.     fish oil-omega-3 fatty acids 1000 MG capsule Take 1 g by mouth daily.     flecainide (TAMBOCOR) 100 MG tablet Take 1 tablet (100 mg total) by mouth 2 (two) times daily. 90 tablet 2   oxyCODONE-acetaminophen (PERCOCET) 5-325 MG per tablet Take 1 tablet by mouth every 4 (four) hours as needed. pain     potassium chloride SA  (KLOR-CON) 20 MEQ tablet TAKE 2 TABLETS DAILY. 180 tablet 3   No current facility-administered medications for this visit.     Past Surgical History:  Procedure Laterality Date   APPENDECTOMY     CHOLECYSTECTOMY     COLONOSCOPY  10/07/03   normal rectum, submucosal nodule in mid-descending colon c/w lipoma   COLONOSCOPY  03/22/2011   Procedure: COLONOSCOPY;  Surgeon: Daneil Dolin, MD;  Location: [facility];  Service: Endoscopy;  Laterality: N/A;  2:30   fused C4, C5, C6     anterior approach   lumbar back surgery     TOTAL SHOULDER ARTHROPLASTY     WRIST SURGERY       No Known Allergies    Family History  Problem Relation Age of Onset   Cancer Mother    Dementia Mother    Heart attack Brother 46   Colon cancer Neg Hx      Social History [name] reports that he has been smoking cigarettes. He has been smoking an average of .5 packs per day. He has never used smokeless tobacco. [name] reports current alcohol use of about 2.0 standard drinks per week.   Review of Systems CONSTITUTIONAL: No weight  loss, fever, chills, weakness or fatigue.  HEENT: Eyes: No visual loss, blurred vision, double vision or yellow sclerae.No hearing loss, sneezing, congestion, runny nose or sore throat.  SKIN: No rash or itching.  CARDIOVASCULAR: per hpi RESPIRATORY: No shortness of breath, cough or sputum.  GASTROINTESTINAL: No anorexia, nausea, vomiting or diarrhea. No abdominal pain or blood.  GENITOURINARY: No burning on urination, no polyuria NEUROLOGICAL: No headache, dizziness, syncope, paralysis, ataxia, numbness or tingling in the extremities. No change in bowel or bladder control.  MUSCULOSKELETAL: No muscle, back pain, joint pain or stiffness.  LYMPHATICS: No enlarged nodes. No history of splenectomy.  PSYCHIATRIC: No history of depression or anxiety.  ENDOCRINOLOGIC: No reports of sweating, cold or heat intolerance. No polyuria or polydipsia.  Marland Kitchen   Physical  Examination Today's Vitals   10/07/20 1023  BP: 110/80  Pulse: (!) 51  SpO2: 99%  Weight: 178 lb 3.2 oz (80.8 kg)  Height: 6' (1.829 m)   Body mass index is 24.17 kg/m.  Gen: resting comfortably, no acute distress HEENT: no scleral icterus, pupils equal round and reactive, no palptable cervical adenopathy,  CV: RRR, no m/r/ gno jvd Resp: Clear to auscultation bilaterally GI: abdomen is soft, non-tender, non-distended, normal bowel sounds, no hepatosplenomegaly MSK: extremities are warm, no edema.  Skin: warm, no rash Neuro:  no focal deficits Psych: appropriate affect   Diagnostic Studies     Assessment and Plan   1. Afib  -isolated episode of palpitations,seen by pcp that day and EKG actually showed aflutter - pcp added lopressor to his flecanide and diltiazem regimen - symptoms resolved later that day, no recurrence - EKG today shows sinus brady at 51 - he reports some HRs in the 40s at home, lower lopressor to 12.'5mg'$  bid. Continue diltiazem and flecanide at current doses. If recurrences would have him see EP to reassess rhythm strategy regimen. - continue eliquis - he will call us in 1 week with HRs and bp's    Arnoldo Lenis, M.D..

## 2020-10-11 ENCOUNTER — Ambulatory Visit (HOSPITAL_COMMUNITY)
Admission: RE | Admit: 2020-10-11 | Discharge: 2020-10-11 | Disposition: A | Discharge status: Home / Self Care | Payer: Medicare Other | Source: Ambulatory Visit | Attending: Internal Medicine | Admitting: Internal Medicine

## 2020-10-11 ENCOUNTER — Other Ambulatory Visit: Payer: Self-pay

## 2020-10-11 ENCOUNTER — Encounter: Payer: Self-pay | Admitting: Cardiology

## 2020-10-11 ENCOUNTER — Telehealth: Payer: Self-pay | Admitting: Cardiology

## 2020-10-11 DIAGNOSIS — M25511 Pain in right shoulder: Secondary | ICD-10-CM | POA: Diagnosis not present

## 2020-10-11 NOTE — Telephone Encounter (Signed)
Fwd to anticoag for approval.

## 2020-10-11 NOTE — Telephone Encounter (Signed)
Left message to return call 

## 2020-10-11 NOTE — Telephone Encounter (Signed)
Patient calling the office for samples of medication:   1.  What medication and dosage are you requesting samples for?  ELIQUIS  2.  Are you currently out of this medication? YES  States that he cannot afford to pay $150.00

## 2020-10-11 NOTE — Telephone Encounter (Signed)
Pt requesting eliquis samples: Indication: Atrial fib Last office visit: 10/07/20  Zandra Abts MD Scr: 0.93 on 12/05/19 Age: 70 Weight: 80.8kg  Based on above findings Eliquis '5mg'$  twice daily is the appropriate dose.  OK to provide pt samples.  Pt is past due for lab work.  Pt needs BMP/CBC lab orders when he comes to pick up samples.

## 2020-10-13 ENCOUNTER — Encounter: Payer: Self-pay | Admitting: *Deleted

## 2020-10-13 NOTE — Telephone Encounter (Signed)
Patient returned call to [facility]

## 2020-10-13 NOTE — Telephone Encounter (Signed)
Patient lives in [place], will coming to [place] office tomorrow to get samples. We will request cbc,bmet from dr fagan done 9/19

## 2020-10-13 NOTE — Telephone Encounter (Signed)
Patient states that they have been trying to get through to [facility] but the line is ring busy for over an hour.  Please call patient about his Eliquis

## 2020-10-27 ENCOUNTER — Encounter: Payer: Self-pay | Admitting: Orthopaedic Surgery

## 2020-10-27 ENCOUNTER — Other Ambulatory Visit: Payer: Self-pay

## 2020-10-27 ENCOUNTER — Ambulatory Visit (INDEPENDENT_AMBULATORY_CARE_PROVIDER_SITE_OTHER): Payer: Medicare Other | Admitting: Orthopaedic Surgery

## 2020-10-27 DIAGNOSIS — M25511 Pain in right shoulder: Secondary | ICD-10-CM | POA: Insufficient documentation

## 2020-10-27 DIAGNOSIS — G8929 Other chronic pain: Secondary | ICD-10-CM | POA: Diagnosis not present

## 2020-10-27 DIAGNOSIS — M25561 Pain in right knee: Secondary | ICD-10-CM | POA: Diagnosis not present

## 2020-10-27 NOTE — Progress Notes (Signed)
Office Visit Note   Patient: Travis Booker           Date of Birth: 08-Nov-1950           MRN: [id number] Visit Date: 10/27/2020              Requested by: Asencion Noble, MD [address] [place],  [place] [address] PCP: Asencion Noble, MD   Assessment & Plan: Visit Diagnoses:  1. Chronic pain of right knee   2. Chronic right shoulder pain     Plan: [name] relates that he injured his right shoulder about 3 to 4 months ago.  He had been involved in a lot of repetitive activity washing his house with a brush.  After that activity he began to experience quite a bit of discomfort in his shoulder to the point of compromise.  He has been taking an occasional Percocet that helps but still having considerable pain with overhead activity and sleeping.  He has had prior left shoulder surgery.  Based on his exam I think he has a rotator cuff tear.  He does have a some grating with internal and external rotation with positive impingement empty can testing and he is weak with external rotation.  Biceps muscle is in an inferior position consistent with rupture of the long head of the biceps.  I will obtain an MRI scan  Follow-Up Instructions: Return After MRI scan right shoulder.   Orders:  Orders Placed This Encounter  Procedures   MR SHOULDER RIGHT WO CONTRAST   No orders of the defined types were placed in this encounter.     Procedures: No procedures performed   Clinical Data: No additional findings.   Subjective: Chief Complaint  Patient presents with   Right Shoulder - Pain  Patient presents today for right shoulder pain. He said that he was washing his house with a brush three months ago and developed pain afterwards. He takes Percocet and that helps some. His PCP ordered x-rays and had those done at [facility].  Those films were reviewed.  There are some degenerative changes at the [facility] joint but the humeral head is centered about the glenoid and there is a normal  space between the humeral head and the acromion.  No ectopic calcification or evidence of fracture.  HPI  Review of Systems   Objective: Vital Signs: There were no vitals taken for this visit.  Physical Exam Constitutional:      Appearance: He is well-developed.  Pulmonary:     Effort: Pulmonary effort is normal.  Skin:    General: Skin is warm and dry.  Neurological:     Mental Status: He is alert and oriented to person, place, and time.  Psychiatric:        Behavior: Behavior normal.    Ortho Exam right shoulder with inferiorly positioned biceps muscle consistent with rupture of the long head of the biceps tendon.  Has considerable weakness with external rotation but good strength with internal rotation.  Neurologically intact.  Positive impingement and empty can testing.  I was able to place his arm fully overhead with a circuitous arc of motion and he was able to maintain that position but he had difficulty doing and actively.  Specialty Comments:  No specialty comments available.  Imaging: No results found.   PMFS History: Patient Active Problem List   Diagnosis Date Noted   Pain in right shoulder 10/27/2020   Pain in right knee 03/31/2020  CAP (community acquired pneumonia) 09/18/2013   Rectal bleeding 03/16/2011   Loose stools 03/16/2011   Chest pain, unspecified 05/17/2010   Pre-operative cardiovascular examination 05/17/2010   Tobacco abuse 05/17/2010   ESSENTIAL HYPERTENSION, BENIGN 12/24/2007   ATRIAL FIBRILLATION 12/24/2007   COPD 12/24/2007   GERD 12/24/2007   Past Medical History:  Diagnosis Date   Cervical disc disease    surgery planned for 05/25/10 (Dr. Hal Neer)   COPD (chronic obstructive pulmonary disease) ([facility])    Depression with anxiety    DJD (degenerative joint disease)    GERD (gastroesophageal reflux disease)    HTN (hypertension)    Paroxysmal atrial fibrillation (HCC)    a. flecainide therapy;  b. echo 6/09: EF normal, mild LVH;   c.  Myoview 7/09: EF 59%, no ischemia    Family History  Problem Relation Age of Onset   Cancer Mother    Dementia Mother    Heart attack Brother 21   Colon cancer Neg Hx     Past Surgical History:  Procedure Laterality Date   APPENDECTOMY     CHOLECYSTECTOMY     COLONOSCOPY  10/07/03   normal rectum, submucosal nodule in mid-descending colon c/w lipoma   COLONOSCOPY  03/22/2011   Procedure: COLONOSCOPY;  Surgeon: Daneil Dolin, MD;  [facility];  Service: Endoscopy;  Laterality: N/A;  2:30   fused C4, C5, C6     anterior approach   lumbar back surgery     TOTAL SHOULDER ARTHROPLASTY     WRIST SURGERY     Social History   Occupational History   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

## 2020-11-10 ENCOUNTER — Ambulatory Visit (HOSPITAL_COMMUNITY)
Admission: RE | Admit: 2020-11-10 | Discharge: 2020-11-10 | Disposition: A | Discharge status: Home / Self Care | Payer: Medicare Other | Source: Ambulatory Visit | Attending: Orthopaedic Surgery | Admitting: Orthopaedic Surgery

## 2020-11-10 ENCOUNTER — Other Ambulatory Visit: Payer: Self-pay

## 2020-11-10 DIAGNOSIS — G8929 Other chronic pain: Secondary | ICD-10-CM | POA: Insufficient documentation

## 2020-11-10 DIAGNOSIS — M25511 Pain in right shoulder: Secondary | ICD-10-CM | POA: Diagnosis not present

## 2020-11-15 ENCOUNTER — Telehealth: Payer: Self-pay | Admitting: Orthopaedic Surgery

## 2020-11-15 NOTE — Telephone Encounter (Signed)
Patient called advised he was returning a call to get his results. The number to contact patient is [phone]

## 2020-11-16 NOTE — Telephone Encounter (Signed)
Will need surgery-have completed the form

## 2020-11-18 NOTE — Telephone Encounter (Signed)
Will need release form from cardiology with instructions on when to stop eliquis

## 2020-11-18 NOTE — Telephone Encounter (Signed)
I called to offer dates for right shoulder surgery. We will need to obtain perioperative instructions prior to surgery since the patient was seen recently by his cardiologist for A-fib and is currently on Eliquis.

## 2020-11-19 ENCOUNTER — Telehealth: Payer: Self-pay | Admitting: Cardiology

## 2020-11-19 NOTE — Telephone Encounter (Signed)
   [facility] Pre-operative Risk Assessment    Patient Name: Travis Booker  DOB: [birth year] MRN: [id number]  HEARTCARE STAFF:  - IMPORTANT!!!!!! Under Visit Info/Reason for Call, type in Other and utilize the format Clearance MM/DD/YY or Clearance TBD. Do not use dashes or single digits. - Please review there is not already an duplicate clearance open for this procedure. - If request is for dental extraction, please clarify the # of teeth to be extracted. - If the patient is currently at the dentist's office, call Pre-Op Callback Staff (MA/nurse) to input urgent request.  - If the patient is not currently in the dentist office, please route to the Pre-Op pool.  Request for surgical clearance:  What type of surgery is being performed right shoulder arthroscopy   When is this surgery scheduled? TBD  What type of clearance is required (medical clearance vs. Pharmacy clearance to hold med vs. Both)? both  Are there any medications that need to be held prior to surgery and how long?  Eliquis please advise   Practice name and name of physician performing surgery? [facility]  What is the office phone number? [phone]   7.   What is the office fax number? [phone]   8.   Anesthesia type (None, local, MAC, general) ? General    Jannet Askew 11/19/2020, 11:48 AM  _________________________________________________________________   (provider comments below)

## 2020-11-22 NOTE — Telephone Encounter (Signed)
Patient with diagnosis of atrial fibrillation on Eliquis for anticoagulation.    Procedure: right shoulder arthroscopy Date of procedure: TBD   CHA2DS2-VASc Score = 2   This indicates a 2.2% annual risk of stroke. The patient's score is based upon: CHF History: 0 HTN History: 1 Diabetes History: 0 Stroke History: 0 Vascular Disease History: 0 Age Score: 1 Gender Score: 0   CrCl 105 Platelet count 236  Per office protocol, patient can hold Eliquis for 3 days prior to procedure.   Patient will not need bridging with Lovenox (enoxaparin) around procedure.  For orthopedic procedures please be sure to resume therapeutic (not prophylactic) dosing.

## 2020-11-22 NOTE — Telephone Encounter (Signed)
   Name: Travis Booker  DOB: 1950/08/06  MRN: [id number]   Primary Cardiologist: Carlyle Dolly, MD  Chart reviewed as part of pre-operative protocol coverage. Patient was contacted 11/22/2020 in reference to pre-operative risk assessment for pending surgery as outlined below.  Travis Booker was last seen on 10/07/20 by Dr. Harl Bowie.  Since that day, Travis Booker has done from a cardiac standpoint.  His shoulder pain has limited his activity some however he is still able to complete 4 METS without anginal complaints.  He has no known CAD.  Has paroxysmal atrial fibrillation/flutter for which she is on flecainide and Eliquis.  He has no complaints of palpitations.  Therefore, based on ACC/AHA guidelines, the patient would be at acceptable risk for the planned procedure without further cardiovascular testing.   The patient was advised that if he develops new symptoms prior to surgery to contact our office to arrange for a follow-up visit, and he verbalized understanding.  Per pharmacy recommendations, patient can hold Eliquis 3 days prior to his upcoming procedure with plans to restart as soon as he is cleared to do so by his orthopedist.  I will route this recommendation to the requesting party via [place] fax function and remove from pre-op pool. Please call with questions.  Abigail Butts, PA-C 11/22/2020, 10:58 AM

## 2020-12-20 ENCOUNTER — Telehealth: Payer: Self-pay | Admitting: Orthopaedic Surgery

## 2020-12-20 NOTE — Telephone Encounter (Signed)
Pt called requesting a call back. Pt states he has shoulder replacement surgery in a few days and has medical questions. Please call pt at [phone].

## 2020-12-23 ENCOUNTER — Other Ambulatory Visit: Payer: Self-pay | Admitting: Orthopaedic Surgery

## 2020-12-23 ENCOUNTER — Telehealth: Payer: Self-pay | Admitting: Orthopaedic Surgery

## 2020-12-23 DIAGNOSIS — S46011A Strain of muscle(s) and tendon(s) of the rotator cuff of right shoulder, initial encounter: Secondary | ICD-10-CM | POA: Diagnosis not present

## 2020-12-23 DIAGNOSIS — M659 Synovitis and tenosynovitis, unspecified: Secondary | ICD-10-CM | POA: Diagnosis not present

## 2020-12-23 DIAGNOSIS — G8918 Other acute postprocedural pain: Secondary | ICD-10-CM | POA: Diagnosis not present

## 2020-12-23 DIAGNOSIS — S46211A Strain of muscle, fascia and tendon of other parts of biceps, right arm, initial encounter: Secondary | ICD-10-CM | POA: Diagnosis not present

## 2020-12-23 DIAGNOSIS — S46011D Strain of muscle(s) and tendon(s) of the rotator cuff of right shoulder, subsequent encounter: Secondary | ICD-10-CM | POA: Diagnosis not present

## 2020-12-23 DIAGNOSIS — M7541 Impingement syndrome of right shoulder: Secondary | ICD-10-CM | POA: Diagnosis not present

## 2020-12-23 DIAGNOSIS — M19011 Primary osteoarthritis, right shoulder: Secondary | ICD-10-CM | POA: Diagnosis not present

## 2020-12-23 MED ORDER — HYDROMORPHONE HCL 2 MG PO TABS: 2.0000 mg | ORAL_TABLET | Freq: Four times a day (QID) | ORAL | 0 refills | Status: DC | PRN

## 2020-12-23 NOTE — Telephone Encounter (Signed)
Please callpt and relay that dilaudid sent to pharmacy after surgery-let me know if the prescription went through

## 2020-12-23 NOTE — Telephone Encounter (Signed)
Pt's wife calling to follow up on pain medication that is supposed to be called in for him. The best pharmacy is [facility], [place] and the best phone number is [phone].

## 2020-12-23 NOTE — Telephone Encounter (Signed)
Spoke with patient's wife. Let her know that it was originally submitted to a [facility] in [place], but has now been sent to [facility] as requested.

## 2020-12-30 ENCOUNTER — Encounter: Payer: Self-pay | Admitting: Orthopaedic Surgery

## 2020-12-30 ENCOUNTER — Other Ambulatory Visit: Payer: Self-pay

## 2020-12-30 ENCOUNTER — Ambulatory Visit (INDEPENDENT_AMBULATORY_CARE_PROVIDER_SITE_OTHER): Payer: Medicare Other | Admitting: Orthopaedic Surgery

## 2020-12-30 ENCOUNTER — Telehealth: Payer: Self-pay | Admitting: Orthopaedic Surgery

## 2020-12-30 DIAGNOSIS — S46011S Strain of muscle(s) and tendon(s) of the rotator cuff of right shoulder, sequela: Secondary | ICD-10-CM

## 2020-12-30 DIAGNOSIS — M75121 Complete rotator cuff tear or rupture of right shoulder, not specified as traumatic: Secondary | ICD-10-CM | POA: Insufficient documentation

## 2020-12-30 DIAGNOSIS — M25511 Pain in right shoulder: Secondary | ICD-10-CM

## 2020-12-30 NOTE — Telephone Encounter (Signed)
Pt called to ask if he is allowed to drive. Please call pt at [phone].

## 2020-12-30 NOTE — Progress Notes (Signed)
Office Visit Note   Patient: Travis Booker           Date of Birth: [birth year]           MRN: [id number] Visit Date: 12/30/2020              Requested by: Asencion Noble, MD [address] [place],  [place] [address] PCP: Asencion Noble, MD   Assessment & Plan: Visit Diagnoses:  1. Traumatic complete tear of right rotator cuff, sequela     Plan: Travis Booker is 1 week status post right shoulder surgery that involved an arthroscopic SCD, DCR and mini open partial rotator cuff tear repair.  He had a massive tear of the infra and supraspinatus tendons I was able to partially reapproximate the supraspinatus but the infraspinatus had atrophy to the point where there was nothing to reattach.  He also had rupture of the biceps tendon.  Injury occurred months ago.  Wounds are healing nicely.  We will start physical therapy next week in [place] and have him return to the [place] office next week as well.  Continue with the sling.  Still taking an occasional oxycodone.  We will also initiate circumduction exercises  Follow-Up Instructions: Return in about 1 week (around 01/06/2021).   Orders:  No orders of the defined types were placed in this encounter.  No orders of the defined types were placed in this encounter.     Procedures: No procedures performed   Clinical Data: No additional findings.   Subjective: Chief Complaint  Patient presents with   Right Shoulder - Pain  Patient presents today for follow up on his right shoulder. He had a right shoulder arthroscopy on 12/23/2020. He is one week out from surgery. Patient states that he is doing well, but still hurting. He is taking percocet for pain relief.  HPI  Review of Systems   Objective: Vital Signs: There were no vitals taken for this visit.  Physical Exam  Ortho Exam right shoulder incisions healing without problem.  New Steri-Strips applied.  Good grip and release.  Not attempt range of motion.  Specialty  Comments:  No specialty comments available.  Imaging: No results found.   PMFS History: Patient Active Problem List   Diagnosis Date Noted   Complete tear of right rotator cuff 12/30/2020   Pain in right shoulder 10/27/2020   Pain in right knee 03/31/2020   CAP (community acquired pneumonia) 09/18/2013   Rectal bleeding 03/16/2011   Loose stools 03/16/2011   Chest pain, unspecified 05/17/2010   Pre-operative cardiovascular examination 05/17/2010   Tobacco abuse 05/17/2010   ESSENTIAL HYPERTENSION, BENIGN 12/24/2007   ATRIAL FIBRILLATION 12/24/2007   COPD 12/24/2007   GERD 12/24/2007   Past Medical History:  Diagnosis Date   Cervical disc disease    surgery planned for 05/25/10 (Dr. Hal Neer)   COPD (chronic obstructive pulmonary disease) ([facility])    Depression with anxiety    DJD (degenerative joint disease)    GERD (gastroesophageal reflux disease)    HTN (hypertension)    Paroxysmal atrial fibrillation (HCC)    a. flecainide therapy;  b. echo 6/09: EF normal, mild LVH;   c. Myoview 7/09: EF 59%, no ischemia    Family History  Problem Relation Age of Onset   Cancer Mother    Dementia Mother    Heart attack Brother 80   Colon cancer Neg Hx     Past Surgical History:  Procedure Laterality Date   APPENDECTOMY  CHOLECYSTECTOMY     COLONOSCOPY  10/07/03   normal rectum, submucosal nodule in mid-descending colon c/w lipoma   COLONOSCOPY  03/22/2011   Procedure: COLONOSCOPY;  Surgeon: Daneil Dolin, MD;  [facility];  Service: Endoscopy;  Laterality: N/A;  2:30   fused C4, C5, C6     anterior approach   lumbar back surgery     TOTAL SHOULDER ARTHROPLASTY     WRIST SURGERY     Social History   Occupational History   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

## 2020-12-31 ENCOUNTER — Other Ambulatory Visit: Payer: Self-pay

## 2020-12-31 DIAGNOSIS — S46011S Strain of muscle(s) and tendon(s) of the rotator cuff of right shoulder, sequela: Secondary | ICD-10-CM

## 2020-12-31 NOTE — Telephone Encounter (Signed)
Called and spoke with patient's wife. Relayed information to her and she confirmed that she understood.

## 2020-12-31 NOTE — Telephone Encounter (Signed)
If not taking pain meds and comfortable with sling then OK

## 2021-01-05 ENCOUNTER — Other Ambulatory Visit: Payer: Self-pay

## 2021-01-05 ENCOUNTER — Ambulatory Visit: Payer: Medicare Other | Admitting: Orthopaedic Surgery

## 2021-01-06 ENCOUNTER — Other Ambulatory Visit: Payer: Self-pay

## 2021-01-06 ENCOUNTER — Ambulatory Visit (HOSPITAL_COMMUNITY): Payer: Medicare Other | Attending: Orthopaedic Surgery | Admitting: Occupational Therapy

## 2021-01-06 ENCOUNTER — Encounter (HOSPITAL_COMMUNITY): Payer: Self-pay | Admitting: Occupational Therapy

## 2021-01-06 DIAGNOSIS — R29898 Other symptoms and signs involving the musculoskeletal system: Secondary | ICD-10-CM

## 2021-01-06 DIAGNOSIS — M25611 Stiffness of right shoulder, not elsewhere classified: Secondary | ICD-10-CM | POA: Diagnosis not present

## 2021-01-06 DIAGNOSIS — M25511 Pain in right shoulder: Secondary | ICD-10-CM | POA: Diagnosis not present

## 2021-01-06 NOTE — Therapy (Signed)
[facility] [address] [place], [place], [address] Phone: [phone]   Fax:  [phone]  Occupational Therapy Evaluation  Patient Details  Name: Travis Booker MRN: [id number] Date of Birth: 1950-04-29 Referring Provider (OT): Dr. Joni Fears   Encounter Date: 01/06/2021   OT End of Session - 01/06/21 1722     Visit Number 1    Number of Visits 16    Date for OT Re-Evaluation 03/07/21   mini reassessment 02/03/21   Authorization Type [employer] Medicare    Authorization Time Period no visit limit    Progress Note Due on Visit 10    OT Start Time 1517    OT Stop Time 1553    OT Time Calculation (min) 36 min    Activity Tolerance Patient tolerated treatment well    Behavior During Therapy [facility] for tasks assessed/performed             Past Medical History:  Diagnosis Date   Cervical disc disease    surgery planned for 05/25/10 (Dr. Hal Neer)   COPD (chronic obstructive pulmonary disease) ([facility])    Depression with anxiety    DJD (degenerative joint disease)    GERD (gastroesophageal reflux disease)    HTN (hypertension)    Paroxysmal atrial fibrillation ([facility])    a. flecainide therapy;  b. echo 6/09: EF normal, mild LVH;   c. Myoview 7/09: EF 59%, no ischemia    Past Surgical History:  Procedure Laterality Date   APPENDECTOMY     CHOLECYSTECTOMY     COLONOSCOPY  10/07/03   normal rectum, submucosal nodule in mid-descending colon c/w lipoma   COLONOSCOPY  03/22/2011   Procedure: COLONOSCOPY;  Surgeon: Daneil Dolin, MD;  [facility];  Service: Endoscopy;  Laterality: N/A;  2:30   fused C4, C5, C6     anterior approach   lumbar back surgery     TOTAL SHOULDER ARTHROPLASTY     WRIST SURGERY      There were no vitals filed for this visit.   Subjective Assessment - 01/06/21 1540     Subjective  S: I'm hurting today.    Pertinent History Pt is a 70 y/o male s/p right mini-open RCR on 12/23/20, pt  presents in standard sling to appt. Pt was referred to occupational therapy for evaluation and treatment by Dr. Joni Fears.    Special Tests FOTO: 39/100    Patient Stated Goals To have less pain and be able to move my arm.    Currently in Pain? Yes    Pain Score 5     Pain Location Shoulder    Pain Orientation Right    Pain Descriptors / Indicators Shooting;Sharp    Pain Type Acute pain    Pain Radiating Towards N/A    Pain Onset 1 to 4 weeks ago    Pain Frequency Intermittent    Aggravating Factors  movement, weather    Pain Relieving Factors pain medication    Effect of Pain on Daily Activities unable to use RUE for any ADLs.    Multiple Pain Sites No               [facility] OT Assessment - 01/06/21 1515       Assessment   Medical Diagnosis s/p right RCR    Referring Provider (OT) Dr. Joni Fears    Onset Date/Surgical Date 12/23/20    Hand Dominance Right    Next MD Visit 01/19/2021  Prior Therapy None      Precautions   Precautions Shoulder    Type of Shoulder Precautions P/ROM x 1 month (12/1-12/29)    Shoulder Interventions Shoulder sling/immobilizer;Off for dressing/bathing/exercises      Balance Screen   Has the patient fallen in the past 6 months No    Has the patient had a decrease in activity level because of a fear of falling?  No      Prior Function   Level of Independence Independent    Vocation On disability    Leisure hunting, fishing      ADL   ADL comments Pt is unable to use right arm for any ADLs. Pt is sleeping in a recliner. Pt is primary caregiver for his wife.      Written Expression   Dominant Hand Right      Cognition   Overall Cognitive Status Within Functional Limits for tasks assessed      Observation/Other Assessments   Focus on Therapeutic Outcomes (FOTO)  37/100      ROM / Strength   AROM / PROM / Strength AROM;PROM;Strength      Palpation   Palpation comment mod fascial restrictions to right upper arm, trapezius,  and scapular regions      AROM   Overall AROM  Deficits;Unable to assess;Due to precautions      PROM   Overall PROM Comments Assessed supine, er/IR adducted    PROM Assessment Site Shoulder    Right/Left Shoulder Right    Right Shoulder Flexion 112 Degrees    Right Shoulder ABduction 128 Degrees    Right Shoulder Internal Rotation 90 Degrees    Right Shoulder External Rotation 45 Degrees      Strength   Overall Strength Deficits;Unable to assess;Due to precautions                              OT Education - 01/06/21 1540     Education Details table slides    Person(s) Educated Patient    Methods Explanation;Demonstration;Handout    Comprehension Verbalized understanding;Returned demonstration              OT Short Term Goals - 01/06/21 1725       OT SHORT TERM GOAL #1   Title Pt will be provided with and educated on HEP to improve mobility of RUE required for ADL completion.    Time 4    Period Weeks    Status New    Target Date 02/05/21      OT SHORT TERM GOAL #2   Title Pt will increase RUE P/ROM to [facility] to improve ability to perform dressing tasks with minimal compensatory strategies.    Time 4    Period Weeks    Status New      OT SHORT TERM GOAL #3   Title Pt will increase RUE strength to 3/5 to improve ability to reach items at waist to shoulder height.    Time 4    Period Weeks    Status New               OT Long Term Goals - 01/06/21 1726       OT LONG TERM GOAL #1   Title Pt will decrease pain in RUE to 3/10 or less to improve ability to use RUE as dominant during ADL completion.    Time 4    Period Weeks  Status New    Target Date 03/07/21      OT LONG TERM GOAL #2   Title Pt will increase RUE A/ROM to [facility] to improve ability to reach overhead and behind back when performing dressing and bathing tasks.    Time 8    Period Weeks    Status New      OT LONG TERM GOAL #3   Title Pt will decrease fascial  restrictions in RUE to minimal amounts to increase mobility required for functional reaching tasks.    Time 8    Period Weeks    Status New      OT LONG TERM GOAL #4   Title Pt will increase RUE strength to 4+/5 or greater to improve ability to perform lifting tasks as needed to care for wife.    Time 8    Period Weeks    Status New                   Plan - 01/06/21 1722     Clinical Impression Statement A: Pt is a 70 y/o male s/p right mini-open RCR on 12/23/20 presenting with pain and decreased functional use of RUE limiting ability to complete ADLs. Pt educated on proper sling wear and sling adjusted during session.    OT Occupational Profile and History Problem Focused Assessment - Including review of records relating to presenting problem    Occupational performance deficits (Please refer to evaluation for details): ADL's;IADL's;Leisure    Body Structure / Function / Physical Skills ADL;Endurance;Muscle spasms;UE functional use;Fascial restriction;Pain;ROM;IADL;Strength    Rehab Potential Good    Clinical Decision Making Limited treatment options, no task modification necessary    Comorbidities Affecting Occupational Performance: None    Modification or Assistance to Complete Evaluation  No modification of tasks or assist necessary to complete eval    OT Frequency 2x / week    OT Duration 8 weeks    OT Treatment/Interventions Self-care/ADL training;Ultrasound;DME and/or AE instruction;Patient/family education;Passive range of motion;Electrical Stimulation;Moist Heat;Therapeutic exercise;Manual Therapy;Therapeutic activities    Plan P: Pt will benefit from skilled occupational therapy services to decrease pain and fascial restrictions, increase ROM and strength, functional use of RUE. Treatment plan: myofascial release and manual techniques, P/ROM, AA/ROM, A/ROM, general RUE strengthening, scapular mobility/stability/strengthening, modalities prn    OT Home Exercise Plan eval:  table slides    Consulted and Agree with Plan of Care Patient             Patient will benefit from skilled therapeutic intervention in order to improve the following deficits and impairments:   Body Structure / Function / Physical Skills: ADL, Endurance, Muscle spasms, UE functional use, Fascial restriction, Pain, ROM, IADL, Strength       Visit Diagnosis: Acute pain of right shoulder  Other symptoms and signs involving the musculoskeletal system  Stiffness of right shoulder, not elsewhere classified    Problem List Patient Active Problem List   Diagnosis Date Noted   Complete tear of right rotator cuff 12/30/2020   Pain in right shoulder 10/27/2020   Pain in right knee 03/31/2020   CAP (community acquired pneumonia) 09/18/2013   Rectal bleeding 03/16/2011   Loose stools 03/16/2011   Chest pain, unspecified 05/17/2010   Pre-operative cardiovascular examination 05/17/2010   Tobacco abuse 05/17/2010   ESSENTIAL HYPERTENSION, BENIGN 12/24/2007   ATRIAL FIBRILLATION 12/24/2007   COPD 12/24/2007   GERD 12/24/2007    Guadelupe Sabin, OTR/L  [phone] 01/06/2021, 5:31 PM  [facility]  [place], [place], [address] Phone: [phone]   Fax:  [phone]  Name: Travis Booker MRN: [id number] Date of Birth: 1950-04-14

## 2021-01-06 NOTE — Patient Instructions (Signed)
1) SHOULDER: Flexion On Table   Place hands on towel placed on table, elbows straight. Lean forward with you upper body, pushing towel away from body.  __10-15_ reps per set, __2-3_ sets per day  2) Abduction (Passive)   With arm out to side, resting on towel placed on table with palm DOWN, keeping trunk away from table, lean to the side while pushing towel away from body.  Repeat __10-15__ times. Do __2-3__ sessions per day.  Copyright  VHI. All rights reserved.     3) Internal Rotation (Assistive)   Seated with elbow bent at right angle and held against side, slide arm on table surface in an inward arc keeping elbow anchored in place. Repeat __10-15__ times. Do __2-3__ sessions per day. Activity: Use this motion to brush crumbs off the table.  Copyright  VHI. All rights reserved.   

## 2021-01-11 ENCOUNTER — Ambulatory Visit (HOSPITAL_COMMUNITY): Payer: Medicare Other | Admitting: Occupational Therapy

## 2021-01-13 ENCOUNTER — Ambulatory Visit (HOSPITAL_COMMUNITY): Payer: Medicare Other | Admitting: Occupational Therapy

## 2021-01-18 ENCOUNTER — Ambulatory Visit (HOSPITAL_COMMUNITY): Payer: Medicare Other | Admitting: Occupational Therapy

## 2021-01-19 ENCOUNTER — Other Ambulatory Visit: Payer: Self-pay

## 2021-01-19 ENCOUNTER — Encounter (HOSPITAL_COMMUNITY): Payer: Medicare Other | Admitting: Occupational Therapy

## 2021-01-19 ENCOUNTER — Ambulatory Visit: Payer: Medicare Other | Admitting: Orthopaedic Surgery

## 2021-01-21 ENCOUNTER — Ambulatory Visit (HOSPITAL_COMMUNITY): Payer: Medicare Other | Admitting: Occupational Therapy

## 2021-01-25 ENCOUNTER — Ambulatory Visit (HOSPITAL_COMMUNITY): Payer: Medicare Other | Attending: Orthopaedic Surgery | Admitting: Occupational Therapy

## 2021-01-25 DIAGNOSIS — M25511 Pain in right shoulder: Secondary | ICD-10-CM | POA: Insufficient documentation

## 2021-01-25 DIAGNOSIS — M25611 Stiffness of right shoulder, not elsewhere classified: Secondary | ICD-10-CM | POA: Insufficient documentation

## 2021-01-25 DIAGNOSIS — R29898 Other symptoms and signs involving the musculoskeletal system: Secondary | ICD-10-CM | POA: Insufficient documentation

## 2021-01-26 ENCOUNTER — Telehealth: Payer: Self-pay | Admitting: Cardiology

## 2021-01-26 NOTE — Telephone Encounter (Signed)
Patient calling the office for samples of medication:   1.  What medication and dosage are you requesting samples for? apixaban (ELIQUIS) 5 MG TABS tablet  2.  Are you currently out of this medication? Yes, states he has not taken it in 3 days

## 2021-01-26 NOTE — Telephone Encounter (Signed)
Eliquis 5 mg twice daily $150 per month Says he has been given the number to call [employer] to see if he qualify for patient assistance but hasn't contacted them yet. Advised that this has to be done to check for eligibility because samples are not always available. Gave number to call [employer] [phone]. Advised that we are out of samples at this time and to call tomorrow with response from [employer]. Verbalized understanding

## 2021-01-27 ENCOUNTER — Ambulatory Visit (HOSPITAL_COMMUNITY): Payer: Medicare Other | Admitting: Occupational Therapy

## 2021-02-01 ENCOUNTER — Encounter (HOSPITAL_COMMUNITY): Payer: Medicare Other

## 2021-02-02 ENCOUNTER — Other Ambulatory Visit: Payer: Self-pay

## 2021-02-02 ENCOUNTER — Ambulatory Visit: Payer: Medicare Other | Admitting: Orthopaedic Surgery

## 2021-02-02 ENCOUNTER — Encounter (HOSPITAL_COMMUNITY): Payer: Self-pay | Admitting: Occupational Therapy

## 2021-02-02 ENCOUNTER — Ambulatory Visit (HOSPITAL_COMMUNITY): Payer: Medicare Other | Admitting: Occupational Therapy

## 2021-02-02 DIAGNOSIS — M25611 Stiffness of right shoulder, not elsewhere classified: Secondary | ICD-10-CM

## 2021-02-02 DIAGNOSIS — M25511 Pain in right shoulder: Secondary | ICD-10-CM | POA: Diagnosis not present

## 2021-02-02 DIAGNOSIS — R29898 Other symptoms and signs involving the musculoskeletal system: Secondary | ICD-10-CM

## 2021-02-02 NOTE — Patient Instructions (Signed)

## 2021-02-02 NOTE — Therapy (Signed)
[facility] [address] [place], [place], [address] Phone: [phone]   Fax:  [phone]  Occupational Therapy Treatment  Patient Details  Name: Travis Booker MRN: [id number] Date of Birth: [birth year] Referring Provider (OT): Dr. Joni Fears   Encounter Date: 02/02/2021   OT End of Session - 02/02/21 1859     Visit Number 2    Number of Visits 16    Date for OT Re-Evaluation 03/07/21    Authorization Type UHC Medicare    Authorization Time Period no visit limit    Progress Note Due on Visit 10    OT Start Time 1650    OT Stop Time 1730    OT Time Calculation (min) 40 min    Activity Tolerance Patient tolerated treatment well    Behavior During Therapy [facility] for tasks assessed/performed             Past Medical History:  Diagnosis Date   Cervical disc disease    surgery planned for 05/25/10 (Dr. Hal Neer)   COPD (chronic obstructive pulmonary disease) ([place])    Depression with anxiety    DJD (degenerative joint disease)    GERD (gastroesophageal reflux disease)    HTN (hypertension)    Paroxysmal atrial fibrillation ([place])    a. flecainide therapy;  b. echo 6/09: EF normal, mild LVH;   c. Myoview 7/09: EF 59%, no ischemia    Past Surgical History:  Procedure Laterality Date   APPENDECTOMY     CHOLECYSTECTOMY     COLONOSCOPY  10/07/03   normal rectum, submucosal nodule in mid-descending colon c/w lipoma   COLONOSCOPY  03/22/2011   Procedure: COLONOSCOPY;  Surgeon: Daneil Dolin, MD;  [facility];  Service: Endoscopy;  Laterality: N/A;  2:30   fused C4, C5, C6     anterior approach   lumbar back surgery     TOTAL SHOULDER ARTHROPLASTY     WRIST SURGERY      There were no vitals filed for this visit.   Subjective Assessment - 02/02/21 1652     Subjective  S: I've been doing exercises.    Currently in Pain? No/denies                [facility] OT Assessment - 02/02/21 1652        Assessment   Medical Diagnosis s/p right RCR      Precautions   Precautions Shoulder    Type of Shoulder Precautions P/ROM x 1 month (12/1-12/29); AA/ROM and progress as tolerated    Shoulder Interventions Shoulder sling/immobilizer;Off for dressing/bathing/exercises      Observation/Other Assessments   Focus on Therapeutic Outcomes (FOTO)  53/100   37/100 previous     Palpation   Palpation comment min fascial restrictions to right upper arm, trapezius, and scapular regions   mod restrictions previous     AROM   Overall AROM Comments Assessed seated, er/IR adducted    AROM Assessment Site Shoulder    Right/Left Shoulder Right    Right Shoulder Flexion 84 Degrees   not previously assessed   Right Shoulder ABduction 52 Degrees   not previously assessed   Right Shoulder Internal Rotation 90 Degrees   not previously assessed   Right Shoulder External Rotation 28 Degrees   not previously assessed     PROM   Overall PROM Comments Assessed supine, er/IR adducted    PROM Assessment Site Shoulder    Right/Left Shoulder Right  Right Shoulder Flexion 146 Degrees   112 previous   Right Shoulder ABduction 150 Degrees   128 previous   Right Shoulder Internal Rotation 90 Degrees   same as previous   Right Shoulder External Rotation 60 Degrees   45 previous     Strength   Overall Strength Comments Assessed on observation, no formal MMT due to precautions    Strength Assessment Site Shoulder    Right/Left Shoulder Right    Right Shoulder Flexion 3-/5   not previously assessed   Right Shoulder ABduction 3-/5   not previously assessed   Right Shoulder Internal Rotation 3/5   not previously assessed   Right Shoulder External Rotation 3-/5   not previously assessed                     OT Treatments/Exercises (OP) - 02/02/21 1703       Exercises   Exercises Shoulder      Shoulder Exercises: Supine   Protraction PROM;5 reps;AAROM;10 reps    Horizontal ABduction PROM;5  reps;AAROM;10 reps    External Rotation PROM;5 reps;AAROM;10 reps    Internal Rotation PROM;5 reps;AAROM;10 reps    Flexion PROM;5 reps;AAROM;10 reps    ABduction PROM;5 reps;AAROM;10 reps      Shoulder Exercises: Standing   Protraction AAROM;10 reps    Horizontal ABduction AAROM;10 reps    External Rotation AAROM;10 reps    Internal Rotation AAROM;10 reps    Flexion AAROM;10 reps    ABduction AAROM;10 reps      Shoulder Exercises: Pulleys   Flexion 1 minute    ABduction 1 minute      Shoulder Exercises: Therapy Ball   Right/Left 5 reps   each direction     Shoulder Exercises: ROM/Strengthening   Thumb Tacks 1'    Prot/Ret//Elev/Dep 1'    Other ROM/Strengthening Exercises PVC Pipe slide, 10X flexion                    OT Education - 02/02/21 1712     Education Details shoulder AA/ROM    Person(s) Educated Patient    Methods Explanation;Demonstration;Handout    Comprehension Verbalized understanding;Returned demonstration              OT Short Term Goals - 02/02/21 1712       OT SHORT TERM GOAL #1   Title Pt will be provided with and educated on HEP to improve mobility of RUE required for ADL completion.    Time 4    Period Weeks    Status On-going    Target Date 02/05/21      OT SHORT TERM GOAL #2   Title Pt will increase RUE P/ROM to [facility] to improve ability to perform dressing tasks with minimal compensatory strategies.    Time 4    Period Weeks    Status Achieved      OT SHORT TERM GOAL #3   Title Pt will increase RUE strength to 3/5 to improve ability to reach items at waist to shoulder height.    Time 4    Period Weeks    Status On-going               OT Long Term Goals - 02/02/21 1712       OT LONG TERM GOAL #1   Title Pt will decrease pain in RUE to 3/10 or less to improve ability to use RUE as dominant during ADL completion.    Time  4    Period Weeks    Status On-going    Target Date 03/07/21      OT LONG TERM GOAL #2    Title Pt will increase RUE A/ROM to [facility] to improve ability to reach overhead and behind back when performing dressing and bathing tasks.    Time 8    Period Weeks    Status On-going      OT LONG TERM GOAL #3   Title Pt will decrease fascial restrictions in RUE to minimal amounts to increase mobility required for functional reaching tasks.    Time 8    Period Weeks    Status Achieved      OT LONG TERM GOAL #4   Title Pt will increase RUE strength to 4+/5 or greater to improve ability to perform lifting tasks as needed to care for wife.    Time 8    Period Weeks    Status On-going                   Plan - 02/02/21 1859     Clinical Impression Statement A: Pt attending first session since evaluation on 01/06/21. Pt did not know he had a follow-up MD appt this afternoon, therefore missed it. Pt reports he has been completing his HEP, walking up the wall, and doing some reaching for objects. Mini-reassessment completed today as pt has not been seen in 4 weeks. Pt has met 1 STG and 1 LTG thus far. Completed passive stretching and initiated AA/ROM today as pt is weeks out from sx. Also completed pulleys and ball circles, pt reports soreness and tightness but no overt pain. Updated HEP. Verbal cuing for form and technique during exercises.    Body Structure / Function / Physical Skills ADL;Endurance;Muscle spasms;UE functional use;Fascial restriction;Pain;ROM;IADL;Strength    Plan P: Follow up on pt scheduling an MD appt. Follow up on HEP, continue working on AA/ROM and add wall wash    OT Home Exercise Plan eval: table slides; 1/11: AA/ROM    Consulted and Agree with Plan of Care Patient             Patient will benefit from skilled therapeutic intervention in order to improve the following deficits and impairments:   Body Structure / Function / Physical Skills: ADL, Endurance, Muscle spasms, UE functional use, Fascial restriction, Pain, ROM, IADL, Strength       Visit  Diagnosis: Acute pain of right shoulder  Other symptoms and signs involving the musculoskeletal system  Stiffness of right shoulder, not elsewhere classified    Problem List Patient Active Problem List   Diagnosis Date Noted   Complete tear of right rotator cuff 12/30/2020   Pain in right shoulder 10/27/2020   Pain in right knee 03/31/2020   CAP (community acquired pneumonia) 09/18/2013   Rectal bleeding 03/16/2011   Loose stools 03/16/2011   Chest pain, unspecified 05/17/2010   Pre-operative cardiovascular examination 05/17/2010   Tobacco abuse 05/17/2010   ESSENTIAL HYPERTENSION, BENIGN 12/24/2007   ATRIAL FIBRILLATION 12/24/2007   COPD 12/24/2007   GERD 12/24/2007    Guadelupe Sabin, OTR/L  [phone] 02/02/2021, 7:03 PM  [facility] [address] [place], [place], [address] Phone: [phone]   Fax:  [phone]  Name: Travis Booker MRN: [id number] Date of Birth: [birth year]

## 2021-02-04 ENCOUNTER — Encounter (HOSPITAL_COMMUNITY): Payer: Medicare Other | Admitting: Occupational Therapy

## 2021-02-07 ENCOUNTER — Telehealth (HOSPITAL_COMMUNITY): Payer: Self-pay

## 2021-02-07 ENCOUNTER — Ambulatory Visit (HOSPITAL_COMMUNITY): Payer: Medicare Other

## 2021-02-07 NOTE — Telephone Encounter (Signed)
Called patient regarding no show to today's OT appointment. No answer for mobile number. Called home number and spoke with Odette Horns who answered to let her know of no show. She was going to call him.   She then called back. OT let her know that the front desk staff informed her that he did show for his appointment although he was 30 minutes late and unable to be seen. If he wants to make up his missed appointment there are times right now Tuesday and Friday. He can call the front desk to schedule.    Ailene Ravel, OTR/L,CBIS  [phone]

## 2021-02-09 ENCOUNTER — Other Ambulatory Visit: Payer: Self-pay

## 2021-02-09 ENCOUNTER — Ambulatory Visit (HOSPITAL_COMMUNITY): Payer: Medicare Other | Admitting: Occupational Therapy

## 2021-02-09 ENCOUNTER — Encounter (HOSPITAL_COMMUNITY): Payer: Self-pay | Admitting: Occupational Therapy

## 2021-02-09 DIAGNOSIS — R29898 Other symptoms and signs involving the musculoskeletal system: Secondary | ICD-10-CM | POA: Diagnosis not present

## 2021-02-09 DIAGNOSIS — M25511 Pain in right shoulder: Secondary | ICD-10-CM | POA: Diagnosis not present

## 2021-02-09 DIAGNOSIS — M25611 Stiffness of right shoulder, not elsewhere classified: Secondary | ICD-10-CM | POA: Diagnosis not present

## 2021-02-09 NOTE — Therapy (Signed)
[facility] [address] [place], [place], [address] Phone: [phone]   Fax:  [phone]  Occupational Therapy Treatment  Patient Details  Name: Travis Booker MRN: [id number] Date of Birth: [birth year] Referring Provider (OT): Dr. Joni Fears   Encounter Date: 02/09/2021   OT End of Session - 02/09/21 1730     Visit Number 3    Number of Visits 16    Date for OT Re-Evaluation 03/07/21    Authorization Type UHC Medicare    Authorization Time Period no visit limit    Progress Note Due on Visit 10    OT Start Time 0623    OT Stop Time 1729    OT Time Calculation (min) 39 min    Activity Tolerance Patient tolerated treatment well    Behavior During Therapy [facility] for tasks assessed/performed             Past Medical History:  Diagnosis Date   Cervical disc disease    surgery planned for 05/25/10 (Dr. Hal Neer)   COPD (chronic obstructive pulmonary disease) ([place])    Depression with anxiety    DJD (degenerative joint disease)    GERD (gastroesophageal reflux disease)    HTN (hypertension)    Paroxysmal atrial fibrillation ([place])    a. flecainide therapy;  b. echo 6/09: EF normal, mild LVH;   c. Myoview 7/09: EF 59%, no ischemia    Past Surgical History:  Procedure Laterality Date   APPENDECTOMY     CHOLECYSTECTOMY     COLONOSCOPY  10/07/03   normal rectum, submucosal nodule in mid-descending colon c/w lipoma   COLONOSCOPY  03/22/2011   Procedure: COLONOSCOPY;  Surgeon: Daneil Dolin, MD;  [facility];  Service: Endoscopy;  Laterality: N/A;  2:30   fused C4, C5, C6     anterior approach   lumbar back surgery     TOTAL SHOULDER ARTHROPLASTY     WRIST SURGERY      There were no vitals filed for this visit.   Subjective Assessment - 02/09/21 1653     Subjective  S: I've been doing my exercises.    Currently in Pain? Yes    Pain Score 5     Pain Location Shoulder    Pain Orientation Right     Pain Descriptors / Indicators Aching;Sore    Pain Type Acute pain    Pain Radiating Towards N/A    Pain Onset More than a month ago    Pain Frequency Intermittent    Aggravating Factors  movement    Pain Relieving Factors pain medication    Effect of Pain on Daily Activities unable to use RUE for ADLs    Multiple Pain Sites No                [facility] OT Assessment - 02/09/21 1652       Assessment   Medical Diagnosis s/p right RCR      Precautions   Precautions Shoulder    Type of Shoulder Precautions P/ROM x 1 month (12/1-12/29); AA/ROM and progress as tolerated    Shoulder Interventions Shoulder sling/immobilizer;Off for dressing/bathing/exercises                      OT Treatments/Exercises (OP) - 02/09/21 1653       Exercises   Exercises Shoulder      Shoulder Exercises: Supine   Protraction PROM;5 reps;AAROM;10 reps    Horizontal ABduction PROM;5  reps;AAROM;10 reps    External Rotation PROM;5 reps;AAROM;10 reps    Internal Rotation PROM;5 reps;AAROM;10 reps    Flexion PROM;5 reps;AAROM;10 reps    ABduction PROM;5 reps;AAROM;10 reps      Shoulder Exercises: Standing   Protraction AAROM;10 reps    Horizontal ABduction AAROM;10 reps    External Rotation AAROM;10 reps    Internal Rotation AAROM;10 reps    Flexion AAROM;10 reps    ABduction AAROM;10 reps      Shoulder Exercises: Pulleys   Flexion 1 minute    ABduction 1 minute      Shoulder Exercises: ROM/Strengthening   Wall Wash 1'    Proximal Shoulder Strengthening, Supine 10X each, no rest breaks    Other ROM/Strengthening Exercises PVC Pipe slide, 10X flexion      Manual Therapy   Manual Therapy Myofascial release    Manual therapy comments completed separately from therapeutic exercises    Myofascial Release myofascial release to right upper arm, shoulder, and trapezius regions to decrease pain and fascial restrictions and increase joint ROM                      OT Short  Term Goals - 02/02/21 1712       OT SHORT TERM GOAL #1   Title Pt will be provided with and educated on HEP to improve mobility of RUE required for ADL completion.    Time 4    Period Weeks    Status On-going    Target Date 02/05/21      OT SHORT TERM GOAL #2   Title Pt will increase RUE P/ROM to [facility] to improve ability to perform dressing tasks with minimal compensatory strategies.    Time 4    Period Weeks    Status Achieved      OT SHORT TERM GOAL #3   Title Pt will increase RUE strength to 3/5 to improve ability to reach items at waist to shoulder height.    Time 4    Period Weeks    Status On-going               OT Long Term Goals - 02/02/21 1712       OT LONG TERM GOAL #1   Title Pt will decrease pain in RUE to 3/10 or less to improve ability to use RUE as dominant during ADL completion.    Time 4    Period Weeks    Status On-going    Target Date 03/07/21      OT LONG TERM GOAL #2   Title Pt will increase RUE A/ROM to [facility] to improve ability to reach overhead and behind back when performing dressing and bathing tasks.    Time 8    Period Weeks    Status On-going      OT LONG TERM GOAL #3   Title Pt will decrease fascial restrictions in RUE to minimal amounts to increase mobility required for functional reaching tasks.    Time 8    Period Weeks    Status Achieved      OT LONG TERM GOAL #4   Title Pt will increase RUE strength to 4+/5 or greater to improve ability to perform lifting tasks as needed to care for wife.    Time 8    Period Weeks    Status On-going                   Plan - 02/09/21  1730     Clinical Impression Statement A: Pt reports soreness today. Initiated myofascial release to RUE to address fascial restrictions, P/ROM completed. Pt completing AA/ROM in supine and standing, added proximal shoulder strengthening in supine. Added wall wash with mod to max difficulty reaching above shoulder level. Verbal cuing for form and  technique.    Body Structure / Function / Physical Skills ADL;Endurance;Muscle spasms;UE functional use;Fascial restriction;Pain;ROM;IADL;Strength    Plan P:  Follow up on HEP, continue working on AA/ROM    OT Home Exercise Plan eval: table slides; 1/11: AA/ROM    Consulted and Agree with Plan of Care Patient             Patient will benefit from skilled therapeutic intervention in order to improve the following deficits and impairments:   Body Structure / Function / Physical Skills: ADL, Endurance, Muscle spasms, UE functional use, Fascial restriction, Pain, ROM, IADL, Strength       Visit Diagnosis: Acute pain of right shoulder  Other symptoms and signs involving the musculoskeletal system  Stiffness of right shoulder, not elsewhere classified    Problem List Patient Active Problem List   Diagnosis Date Noted   Complete tear of right rotator cuff 12/30/2020   Pain in right shoulder 10/27/2020   Pain in right knee 03/31/2020   CAP (community acquired pneumonia) 09/18/2013   Rectal bleeding 03/16/2011   Loose stools 03/16/2011   Chest pain, unspecified 05/17/2010   Pre-operative cardiovascular examination 05/17/2010   Tobacco abuse 05/17/2010   ESSENTIAL HYPERTENSION, BENIGN 12/24/2007   ATRIAL FIBRILLATION 12/24/2007   COPD 12/24/2007   GERD 12/24/2007    Guadelupe Sabin, OTR/L  [phone] 02/09/2021, 5:32 PM  [facility] [place], [place], [address] Phone: [phone]   Fax:  [phone]  Name: Travis Booker MRN: [id number] Date of Birth: 08/20/1950

## 2021-02-11 ENCOUNTER — Encounter (HOSPITAL_COMMUNITY): Payer: Medicare Other

## 2021-02-14 ENCOUNTER — Encounter (HOSPITAL_COMMUNITY): Payer: Medicare Other

## 2021-02-16 ENCOUNTER — Encounter (HOSPITAL_COMMUNITY): Payer: Medicare Other

## 2021-02-17 ENCOUNTER — Ambulatory Visit (HOSPITAL_COMMUNITY): Payer: Medicare Other | Admitting: Occupational Therapy

## 2021-02-22 ENCOUNTER — Ambulatory Visit (HOSPITAL_COMMUNITY): Payer: Medicare Other | Admitting: Occupational Therapy

## 2021-02-24 ENCOUNTER — Telehealth (HOSPITAL_COMMUNITY): Payer: Self-pay | Admitting: Occupational Therapy

## 2021-02-24 ENCOUNTER — Ambulatory Visit (HOSPITAL_COMMUNITY): Payer: Medicare Other

## 2021-02-24 NOTE — Telephone Encounter (Signed)
Patient cx he has some family issues he needs to deal with and cannot be here today

## 2021-03-01 ENCOUNTER — Ambulatory Visit (HOSPITAL_COMMUNITY): Payer: Medicare Other

## 2021-03-02 ENCOUNTER — Telehealth: Payer: Self-pay | Admitting: Cardiology

## 2021-03-02 NOTE — Telephone Encounter (Signed)
Patient calling the office for samples of medication:   1.  What medication and dosage are you requesting samples for? apixaban (ELIQUIS) 5 MG TABS tablet  2.  Are you currently out of this medication? No  Patient is waiting to get paperwork to apply for financial assistance for Eliquis. Patient said he would like to pick the samples up from the [place] office if possible

## 2021-03-02 NOTE — Telephone Encounter (Signed)
Reports [employer] application was sent incorrectly with wrong address and awaiting it to be returned  Advised we are currently out of eliquis Will request samples from [place] office

## 2021-03-03 ENCOUNTER — Ambulatory Visit (HOSPITAL_COMMUNITY): Payer: Medicare Other | Attending: Orthopaedic Surgery

## 2021-03-03 ENCOUNTER — Other Ambulatory Visit: Payer: Self-pay

## 2021-03-03 ENCOUNTER — Encounter (HOSPITAL_COMMUNITY): Payer: Self-pay

## 2021-03-03 DIAGNOSIS — R29898 Other symptoms and signs involving the musculoskeletal system: Secondary | ICD-10-CM | POA: Diagnosis not present

## 2021-03-03 DIAGNOSIS — M25511 Pain in right shoulder: Secondary | ICD-10-CM | POA: Diagnosis not present

## 2021-03-03 DIAGNOSIS — M25611 Stiffness of right shoulder, not elsewhere classified: Secondary | ICD-10-CM | POA: Insufficient documentation

## 2021-03-03 MED ORDER — APIXABAN 5 MG PO TABS: 5.0000 mg | ORAL_TABLET | Freq: Two times a day (BID) | ORAL | 0 refills | Status: AC

## 2021-03-03 NOTE — Telephone Encounter (Signed)
Left message on vm that samples available for pick up at the [facility] office.

## 2021-03-03 NOTE — Therapy (Signed)
[place], [place], [address] Phone: [phone]   Fax:  [phone]  Occupational Therapy Treatment Reassessment/discharge summary Patient Details  Name: Travis Booker MRN: [id number] Date of Birth: 1950/03/28 Referring Provider (OT): Dr. Joni Fears   Encounter Date: 03/03/2021   OT End of Session - 03/03/21 1431     Visit Number 4    Number of Visits 16    Authorization Type [employer] Medicare    Authorization Time Period no visit limit    Progress Note Due on Visit 10    OT Start Time 1300   reassessment and discharge   OT Stop Time 7371    OT Time Calculation (min) 38 min    Activity Tolerance Patient tolerated treatment well    Behavior During Therapy [facility] for tasks assessed/performed             Past Medical History:  Diagnosis Date   Cervical disc disease    surgery planned for 05/25/10 (Dr. Hal Neer)   COPD (chronic obstructive pulmonary disease) ([place])    Depression with anxiety    DJD (degenerative joint disease)    GERD (gastroesophageal reflux disease)    HTN (hypertension)    Paroxysmal atrial fibrillation ([place])    a. flecainide therapy;  b. echo 6/09: EF normal, mild LVH;   c. Myoview 7/09: EF 59%, no ischemia    Past Surgical History:  Procedure Laterality Date   APPENDECTOMY     CHOLECYSTECTOMY     COLONOSCOPY  10/07/03   normal rectum, submucosal nodule in mid-descending colon c/w lipoma   COLONOSCOPY  03/22/2011   Procedure: COLONOSCOPY;  Surgeon: Daneil Dolin, MD;  [facility];  Service: Endoscopy;  Laterality: N/A;  2:30   fused C4, C5, C6     anterior approach   lumbar back surgery     TOTAL SHOULDER ARTHROPLASTY     WRIST SURGERY      There were no vitals filed for this visit.   Subjective Assessment - 03/03/21 1302     Subjective  S: I've been working on my exercises at home even though I couldn't make it.    Currently in Pain? Yes    Pain Score  6     Pain Location Shoulder    Pain Orientation Right    Pain Descriptors / Indicators Sore    Pain Type Acute pain    Pain Onset More than a month ago    Pain Frequency Constant    Aggravating Factors  exercise and movement/use    Pain Relieving Factors pain medication    Effect of Pain on Daily Activities moderate effect                [facility] OT Assessment - 03/03/21 1304       Assessment   Medical Diagnosis s/p right RCR      Precautions   Precautions Shoulder    Type of Shoulder Precautions progress as tolerated      Observation/Other Assessments   Focus on Therapeutic Outcomes (FOTO)  61/100      ROM / Strength   AROM / PROM / Strength AROM;PROM;Strength      Palpation   Palpation comment min fascial restrictions to right upper arm, trapezius, and scapular regions      AROM   Overall AROM Comments Assessed seated, er/IR adducted    AROM Assessment Site Shoulder    Right/Left Shoulder Right  Right Shoulder Flexion 70 Degrees   previous: 84   Right Shoulder ABduction 65 Degrees   previous: 52   Right Shoulder Internal Rotation 90 Degrees   previous: same   Right Shoulder External Rotation 55 Degrees   previous: 28     PROM   Overall PROM Comments Assessed supine, er/IR adducted    PROM Assessment Site Shoulder    Right/Left Shoulder Right    Right Shoulder Flexion 160 Degrees   previous: 146   Right Shoulder ABduction 180 Degrees   previous: 150   Right Shoulder Internal Rotation 90 Degrees   previous: same   Right Shoulder External Rotation 75 Degrees   previous: 60     Strength   Overall Strength Comments Assessed seated. IR/er adducted    Strength Assessment Site Shoulder    Right/Left Shoulder Right    Right Shoulder Flexion 3-/5   previous: same   Right Shoulder ABduction 3-/5   previous: same   Right Shoulder Internal Rotation 4+/5   previous: 3/5   Right Shoulder External Rotation 3+/5   previous: 3-/5                     OT  Treatments/Exercises (OP) - 03/03/21 1321       Exercises   Exercises Shoulder      Shoulder Exercises: Supine   Protraction PROM;5 reps    Horizontal ABduction PROM;5 reps    External Rotation PROM;5 reps    Internal Rotation PROM;5 reps    Flexion PROM;5 reps    ABduction PROM;5 reps      Shoulder Exercises: Standing   Extension Theraband;10 reps    Theraband Level (Shoulder Extension) Level 2 (Red)    Row Theraband;10 reps    Theraband Level (Shoulder Row) Level 2 (Red)    Retraction Theraband;10 reps    Theraband Level (Shoulder Retraction) Level 2 (Red)      Shoulder Exercises: ROM/Strengthening   UBE (Upper Arm Bike) level 1 1' forward 1' reverse pace: 4.0    Wall Wash 1'                    OT Education - 03/03/21 1326     Education Details scapular strengthening with red band    Person(s) Educated Patient    Methods Explanation;Demonstration;Handout    Comprehension Verbalized understanding;Returned demonstration              OT Short Term Goals - 03/03/21 1331       OT SHORT TERM GOAL #1   Title Pt will be provided with and educated on HEP to improve mobility of RUE required for ADL completion.    Time 4    Period Weeks    Status Achieved    Target Date 02/05/21      OT SHORT TERM GOAL #2   Title Pt will increase RUE P/ROM to [facility] to improve ability to perform dressing tasks with minimal compensatory strategies.    Time 4    Period Weeks      OT SHORT TERM GOAL #3   Title Pt will increase RUE strength to 3/5 to improve ability to reach items at waist to shoulder height.    Time 4    Period Weeks    Status Partially Met               OT Long Term Goals - 03/03/21 1332       OT LONG TERM GOAL #  1   Title Pt will decrease pain in RUE to 3/10 or less to improve ability to use RUE as dominant during ADL completion.    Time 4    Period Weeks    Status Partially Met    Target Date 03/07/21      OT LONG TERM GOAL #2   Title Pt  will increase RUE A/ROM to [facility] to improve ability to reach overhead and behind back when performing dressing and bathing tasks.    Time 8    Period Weeks    Status Partially Met      OT LONG TERM GOAL #3   Title Pt will decrease fascial restrictions in RUE to minimal amounts to increase mobility required for functional reaching tasks.    Time 8    Period Weeks      OT LONG TERM GOAL #4   Title Pt will increase RUE strength to 4+/5 or greater to improve ability to perform lifting tasks as needed to care for wife.    Time 8    Period Weeks    Status Not Met                   Plan - 03/03/21 1433     Clinical Impression Statement A: Reassessment completed this date. patient has met 2/3 STGs and 1/4 LTGs with 2 partially met. Patient has not been able to consistently attend therapy sessions although has been working on his HEP and has made progress overall. Patient reports that he is doing as much as he can with his right arm. He has full P/ROM. Continues to be limited with A/ROM and strength with his right shoulder. He continues to require active assisted exercises to complete reaching at or above shoulder level. Provided scapular strengthening to HEP. Patient is interested in continuing his HEP independently at home. All education has been completed.    Body Structure / Function / Physical Skills ADL;Endurance;Muscle spasms;UE functional use;Fascial restriction;Pain;ROM;IADL;Strength    Plan P: D/C from OT services with HEP.    OT Home Exercise Plan eval: table slides; 1/11: AA/ROM 2/9: scapular strengthening - red band    Consulted and Agree with Plan of Care Patient             Patient will benefit from skilled therapeutic intervention in order to improve the following deficits and impairments:   Body Structure / Function / Physical Skills: ADL, Endurance, Muscle spasms, UE functional use, Fascial restriction, Pain, ROM, IADL, Strength       Visit Diagnosis: Acute pain  of right shoulder  Other symptoms and signs involving the musculoskeletal system  Stiffness of right shoulder, not elsewhere classified    Problem List Patient Active Problem List   Diagnosis Date Noted   Complete tear of right rotator cuff 12/30/2020   Pain in right shoulder 10/27/2020   Pain in right knee 03/31/2020   CAP (community acquired pneumonia) 09/18/2013   Rectal bleeding 03/16/2011   Loose stools 03/16/2011   Chest pain, unspecified 05/17/2010   Pre-operative cardiovascular examination 05/17/2010   Tobacco abuse 05/17/2010   ESSENTIAL HYPERTENSION, BENIGN 12/24/2007   ATRIAL FIBRILLATION 12/24/2007   COPD 12/24/2007   GERD 12/24/2007   OCCUPATIONAL THERAPY DISCHARGE SUMMARY  Visits from Start of Care: 4  Current functional level related to goals / functional outcomes: See above   Remaining deficits: See above   Education / Equipment: See above  Patient agrees to discharge. Patient goals were partially met. Patient  is being discharged due to  being pleased with progress and wishing to continue with HEP independently at home.Ailene Ravel, OTR/L,CBIS  [phone]  03/03/2021, 2:36 PM  [place] [address] [place], [place], [address] Phone: [phone]   Fax:  [phone]  Name: Rudell Ortman MRN: [id number] Date of Birth: 1950-11-06

## 2021-03-03 NOTE — Patient Instructions (Signed)
Complete once of day if able.  1) (Home) Extension: Isometric / Bilateral Arm Retraction - Sitting   Facing anchor, hold hands and elbow at shoulder height, with elbow bent.  Pull arms back to squeeze shoulder blades together. Repeat 10-12 times. 1-2 times/day.   2) (Clinic) Extension / Flexion (Assist)   Face anchor, pull arms back, keeping elbow straight, and squeze shoulder blades together. Repeat 10-12 times. 1-2 times/day.   Copyright  VHI. All rights reserved.   3) (Home) Retraction: Row - Bilateral (Anchor)   Facing anchor, arms reaching forward, pull hands toward stomach, keeping elbows bent and at your sides and pinching shoulder blades together. Repeat 10-12 times. 1-2 times/day.   Copyright  VHI. All rights reserved.

## 2021-03-16 ENCOUNTER — Ambulatory Visit: Payer: Medicare Other | Admitting: Orthopaedic Surgery

## 2021-03-16 ENCOUNTER — Other Ambulatory Visit: Payer: Self-pay

## 2021-03-29 ENCOUNTER — Telehealth: Payer: Self-pay | Admitting: *Deleted

## 2021-03-29 NOTE — Telephone Encounter (Signed)
Wife notified that [employer] patient assistance forms completed and faxed to [phone]. Pt requesting to pick up completed forms.  ?

## 2021-03-30 ENCOUNTER — Encounter: Payer: Self-pay | Admitting: Orthopaedic Surgery

## 2021-03-30 ENCOUNTER — Ambulatory Visit (INDEPENDENT_AMBULATORY_CARE_PROVIDER_SITE_OTHER): Payer: Medicare Other | Admitting: Orthopaedic Surgery

## 2021-03-30 VITALS — Ht 72.0 in | Wt 178.0 lb

## 2021-03-30 DIAGNOSIS — M25511 Pain in right shoulder: Secondary | ICD-10-CM | POA: Diagnosis not present

## 2021-03-30 DIAGNOSIS — G8929 Other chronic pain: Secondary | ICD-10-CM

## 2021-03-30 NOTE — Progress Notes (Signed)
? ?Office Visit Note ?  ?Patient: Travis Booker           ?Date of Birth: Oct 04, 1950           ?MRN: [id number] ?Visit Date: 03/30/2021 ?             ?Requested by: Travis Noble, MD ?[address] ?[place],  [place] [address] ?PCP: Travis Noble, MD ? ? ?Assessment & Plan: ?Visit Diagnoses:  ?1. Chronic right shoulder pain   ? ? ?Plan: Travis Booker is just over 3 months status post right shoulder surgery that involved arthroscopic SCD DCR and mini open rotator cuff tear repair of the infra and supraspinatus tendons.  There was only a partial repair as there was significant retraction.  He actually is doing fairly well.  He is now able to raise his arm over his head and fairly quickly but he is obviously weak.  He has made a lot of progress in the last month.  I have encouraged him to work with his strengthening exercises.  I will plan to see him back as needed he is done very well but I expect that over time you can still have some weakness and possibly some discomfort based on the appearance of the cuff.  He is fully aware that but is happy with the results so far ? ?Follow-Up Instructions: Return if symptoms worsen or fail to improve.  ? ?Orders:  ?No orders of the defined types were placed in this encounter. ? ?No orders of the defined types were placed in this encounter. ? ? ? ? Procedures: ?No procedures performed ? ? ?Clinical Data: ?No additional findings. ? ? ?Subjective: ?Chief Complaint  ?Patient presents with  ? Right Shoulder - Follow-up  ?  Right shoulder arthroscopy 12/23/2020  ?Patient presents today for follow up on his right shoulder. He had a right shoulder arthroscopy on 12/23/2020. He was unable to come in for follow up visits due to caring for his wife. He said that he is doing well, but is having some pain. He takes Oxycodone for pain as prescribed by his PCP. ? ?HPI ? ?Review of Systems ? ? ?Objective: ?Vital Signs: Ht 6' (1.829 m)   Wt 178 lb (80.7 kg)   BMI 24.14 kg/m?  ? ?Physical  Exam ?Constitutional:   ?   Appearance: He is well-developed.  ?Pulmonary:  ?   Effort: Pulmonary effort is normal.  ?Skin: ?   General: Skin is warm and dry.  ?Neurological:  ?   Mental Status: He is alert and oriented to person, place, and time.  ?Psychiatric:     ?   Behavior: Behavior normal.  ? ? ?Ortho Exam right shoulder with negative impingement testing.  Able to abduct about 80 degrees at which point he was a bit weak.  He is able to place his arm just about fully overhead.  Good grip and good release.  Negative empty can sign.  No crepitation.  He is weak in external rotation as expected ? ?Specialty Comments:  ?No specialty comments available. ? ?Imaging: ?No results found. ? ? ?PMFS History: ?Patient Active Problem List  ? Diagnosis Date Noted  ? Complete tear of right rotator cuff 12/30/2020  ? Pain in right shoulder 10/27/2020  ? Pain in right knee 03/31/2020  ? CAP (community acquired pneumonia) 09/18/2013  ? Rectal bleeding 03/16/2011  ? Loose stools 03/16/2011  ? Chest pain, unspecified 05/17/2010  ? Pre-operative cardiovascular examination 05/17/2010  ? Tobacco abuse  05/17/2010  ? ESSENTIAL HYPERTENSION, BENIGN 12/24/2007  ? ATRIAL FIBRILLATION 12/24/2007  ? COPD 12/24/2007  ? GERD 12/24/2007  ? ?Past Medical History:  ?Diagnosis Date  ? Cervical disc disease   ? surgery planned for 05/25/10 (Dr. Hal Neer)  ? COPD (chronic obstructive pulmonary disease) ([place])   ? Depression with anxiety   ? DJD (degenerative joint disease)   ? GERD (gastroesophageal reflux disease)   ? HTN (hypertension)   ? Paroxysmal atrial fibrillation ([facility])   ? a. flecainide therapy;  b. echo 6/09: EF normal, mild LVH;   c. Myoview 7/09: EF 59%, no ischemia  ?  ?Family History  ?Problem Relation Age of Onset  ? Cancer Mother   ? Dementia Mother   ? Heart attack Brother 28  ? Colon cancer Neg Hx   ?  ?Past Surgical History:  ?Procedure Laterality Date  ? APPENDECTOMY    ? CHOLECYSTECTOMY    ? COLONOSCOPY  10/07/03  ? normal rectum,  submucosal nodule in mid-descending colon c/w lipoma  ? COLONOSCOPY  03/22/2011  ? Procedure: COLONOSCOPY;  Surgeon: Daneil Dolin, MD;  [facility];  Service: Endoscopy;  Laterality: N/A;  2:30  ? fused C4, C5, C6    ? anterior approach  ? lumbar back surgery    ? TOTAL SHOULDER ARTHROPLASTY    ? WRIST SURGERY    ? ?Social History  ? ?Occupational History  ? Occupation: Mining engineer  ?  Employer: [employer]   ?  Comment: [employer] in [place], [place]; been out of work 9 mos, trying to get disability due to chronic issues   ?Tobacco Use  ? Smoking status: Every Day  ?  Packs/day: 0.50  ?  Types: Cigarettes  ? Smokeless tobacco: Never  ? Tobacco comments:  ?  1 pack a day  ?Vaping Use  ? Vaping Use: Never used  ?Substance and Sexual Activity  ? Alcohol use: Yes  ?  Alcohol/week: 2.0 standard drinks  ?  Types: 2 Standard drinks or equivalent per week  ?  Comment: 12 pack a week   ? Drug use: No  ? Sexual activity: Yes  ?  Partners: Female  ? ? ? ? ? ? ?

## 2021-03-31 ENCOUNTER — Encounter: Payer: Self-pay | Admitting: *Deleted

## 2021-04-01 ENCOUNTER — Other Ambulatory Visit (HOSPITAL_COMMUNITY): Payer: Self-pay | Admitting: Internal Medicine

## 2021-04-01 DIAGNOSIS — F17200 Nicotine dependence, unspecified, uncomplicated: Secondary | ICD-10-CM

## 2021-04-08 ENCOUNTER — Telehealth: Payer: Self-pay | Admitting: *Deleted

## 2021-04-08 NOTE — Telephone Encounter (Signed)
Called to notify pt that he has been approved for the [employer] patient assistance ( Eliquis) until 01/22/22. No answer, left msg to call back.  ?

## 2021-04-11 ENCOUNTER — Ambulatory Visit: Payer: Medicare Other | Admitting: Cardiology

## 2021-04-11 NOTE — Progress Notes (Deleted)
? ? ? ?Clinical Summary ?[name] is a 71 y.o.male seen today for follow up of the following medical problems.  ?  ?  ?1. Afib ?- on flecanide and dilt,eliquis ?- no recent palpitations ?- compliatnw with meds.No bleeding on eliquis. ?  ? -noted to be in aflutter at recent pcp visit. PCP added metoprolol ?- isolated episode of heart racing 10/01/20. He could feel when it went out of rhythm, went back into normal that night.  ?- pcp started metoprolol ?  ?2. HTN  ?- compliant with meds ?  ?  ?Wife if Travis Booker also a patient of mine ? ? ?Past Medical History:  ?Diagnosis Date  ? Cervical disc disease   ? surgery planned for 05/25/10 (Dr. Hal Neer)  ? COPD (chronic obstructive pulmonary disease) ([place])   ? Depression with anxiety   ? DJD (degenerative joint disease)   ? GERD (gastroesophageal reflux disease)   ? HTN (hypertension)   ? Paroxysmal atrial fibrillation ([facility])   ? a. flecainide therapy;  b. echo 6/09: EF normal, mild LVH;   c. Myoview 7/09: EF 59%, no ischemia  ? ? ? ?No Known Allergies ? ? ?Current Outpatient Medications  ?Medication Sig Dispense Refill  ? ALPRAZolam (XANAX) 1 MG tablet Take 1 mg by mouth at bedtime as needed. nerves    ? apixaban (ELIQUIS) 5 MG TABS tablet Take 1 tablet (5 mg total) by mouth 2 (two) times daily. 28 tablet 0  ? chlorthalidone (HYGROTON) 25 MG tablet TAKE 1/2 TABLET BY MOUTH ONCE DAILY. 45 tablet 2  ? diltiazem (CARDIZEM CD) 240 MG 24 hr capsule Take 1 capsule (240 mg total) by mouth daily. 30 capsule 0  ? escitalopram (LEXAPRO) 10 MG tablet Take 10 mg by mouth daily.    ? esomeprazole (NEXIUM) 40 MG capsule Take 40 mg by mouth daily at 12 noon.    ? fish oil-omega-3 fatty acids 1000 MG capsule Take 1 g by mouth daily.    ? flecainide (TAMBOCOR) 100 MG tablet Take 1 tablet (100 mg total) by mouth 2 (two) times daily. 90 tablet 2  ? HYDROmorphone (DILAUDID) 2 MG tablet Take 1 tablet (2 mg total) by mouth every 6 (six) hours as needed for severe pain. 30 tablet 0  ?  HYDROmorphone (DILAUDID) 2 MG tablet Take 1 tablet (2 mg total) by mouth every 6 (six) hours as needed for severe pain. 30 tablet 0  ? metoprolol tartrate (LOPRESSOR) 25 MG tablet Take 0.5 tablets (12.5 mg total) by mouth 2 (two) times daily.    ? omeprazole (PRILOSEC) 40 MG capsule Take 80 mg by mouth daily.    ? oxyCODONE-acetaminophen (PERCOCET) 5-325 MG per tablet Take 1 tablet by mouth every 4 (four) hours as needed. pain    ? potassium chloride SA (KLOR-CON) 20 MEQ tablet TAKE 2 TABLETS DAILY. 180 tablet 3  ? ?No current facility-administered medications for this visit.  ? ? ? ?Past Surgical History:  ?Procedure Laterality Date  ? APPENDECTOMY    ? CHOLECYSTECTOMY    ? COLONOSCOPY  10/07/03  ? normal rectum, submucosal nodule in mid-descending colon c/w lipoma  ? COLONOSCOPY  03/22/2011  ? Procedure: COLONOSCOPY;  Surgeon: Daneil Dolin, MD;  [facility];  Service: Endoscopy;  Laterality: N/A;  2:30  ? fused C4, C5, C6    ? anterior approach  ? lumbar back surgery    ? TOTAL SHOULDER ARTHROPLASTY    ? WRIST SURGERY    ? ? ? ?  No Known Allergies ? ? ? ?Family History  ?Problem Relation Age of Onset  ? Cancer Mother   ? Dementia Mother   ? Heart attack Brother 2  ? Colon cancer Neg Hx   ? ? ? ?Social History ?[name] reports that he has been smoking cigarettes. He has been smoking an average of .5 packs per day. He has never used smokeless tobacco. ?[name] reports current alcohol use of about 2.0 standard drinks per week. ? ? ?Review of Systems ?CONSTITUTIONAL: No weight loss, fever, chills, weakness or fatigue.  ?HEENT: Eyes: No visual loss, blurred vision, double vision or yellow sclerae.No hearing loss, sneezing, congestion, runny nose or sore throat.  ?SKIN: No rash or itching.  ?CARDIOVASCULAR:  ?RESPIRATORY: No shortness of breath, cough or sputum.  ?GASTROINTESTINAL: No anorexia, nausea, vomiting or diarrhea. No abdominal pain or blood.  ?GENITOURINARY: No burning on urination, no  polyuria ?NEUROLOGICAL: No headache, dizziness, syncope, paralysis, ataxia, numbness or tingling in the extremities. No change in bowel or bladder control.  ?MUSCULOSKELETAL: No muscle, back pain, joint pain or stiffness.  ?LYMPHATICS: No enlarged nodes. No history of splenectomy.  ?PSYCHIATRIC: No history of depression or anxiety.  ?ENDOCRINOLOGIC: No reports of sweating, cold or heat intolerance. No polyuria or polydipsia.  ?. ? ? ?Physical Examination ?There were no vitals filed for this visit. ?There were no vitals filed for this visit. ? ?Gen: resting comfortably, no acute distress ?HEENT: no scleral icterus, pupils equal round and reactive, no palptable cervical adenopathy,  ?CV ?Resp: Clear to auscultation bilaterally ?GI: abdomen is soft, non-tender, non-distended, normal bowel sounds, no hepatosplenomegaly ?MSK: extremities are warm, no edema.  ?Skin: warm, no rash ?Neuro:  no focal deficits ?Psych: appropriate affect ? ? ?Diagnostic Studies ? ? ? ? ?Assessment and Plan  ? ?1. Afib  ?-isolated episode of palpitations,seen by pcp that day and EKG actually showed aflutter ?- pcp added lopressor to his flecanide and diltiazem regimen ?- symptoms resolved later that day, no recurrence ?- EKG today shows sinus brady at 51 ?- he reports some HRs in the 40s at home, lower lopressor to 12.'5mg'$  bid. Continue diltiazem and flecanide at current doses. If recurrences would have him see EP to reassess rhythm strategy regimen. ?- continue eliquis ?- he will call us in 1 week with HRs and bp's ? ? ? ? ?Arnoldo Lenis, M.D., F.A.C.C. ?

## 2021-04-12 ENCOUNTER — Ambulatory Visit: Payer: Medicare Other | Admitting: Cardiology

## 2021-04-18 ENCOUNTER — Ambulatory Visit (HOSPITAL_COMMUNITY)
Admission: RE | Admit: 2021-04-18 | Discharge: 2021-04-18 | Disposition: A | Discharge status: Home / Self Care | Payer: Medicare Other | Source: Ambulatory Visit | Attending: Internal Medicine | Admitting: Internal Medicine

## 2021-04-18 ENCOUNTER — Other Ambulatory Visit: Payer: Self-pay

## 2021-04-18 DIAGNOSIS — J439 Emphysema, unspecified: Secondary | ICD-10-CM | POA: Insufficient documentation

## 2021-04-18 DIAGNOSIS — I4892 Unspecified atrial flutter: Secondary | ICD-10-CM | POA: Diagnosis not present

## 2021-04-18 DIAGNOSIS — Z122 Encounter for screening for malignant neoplasm of respiratory organs: Secondary | ICD-10-CM | POA: Insufficient documentation

## 2021-04-18 DIAGNOSIS — F17200 Nicotine dependence, unspecified, uncomplicated: Secondary | ICD-10-CM | POA: Insufficient documentation

## 2021-04-18 DIAGNOSIS — F17210 Nicotine dependence, cigarettes, uncomplicated: Secondary | ICD-10-CM | POA: Diagnosis not present

## 2021-04-18 DIAGNOSIS — I10 Essential (primary) hypertension: Secondary | ICD-10-CM | POA: Diagnosis not present

## 2021-04-18 DIAGNOSIS — I700 Atherosclerosis of aorta: Secondary | ICD-10-CM | POA: Diagnosis not present

## 2021-04-18 DIAGNOSIS — Z79899 Other long term (current) drug therapy: Secondary | ICD-10-CM | POA: Diagnosis not present

## 2021-04-20 ENCOUNTER — Other Ambulatory Visit: Payer: Self-pay | Admitting: Cardiology

## 2021-04-21 DIAGNOSIS — J439 Emphysema, unspecified: Secondary | ICD-10-CM | POA: Diagnosis not present

## 2021-04-21 DIAGNOSIS — I700 Atherosclerosis of aorta: Secondary | ICD-10-CM | POA: Diagnosis not present

## 2021-04-21 DIAGNOSIS — I4892 Unspecified atrial flutter: Secondary | ICD-10-CM | POA: Diagnosis not present

## 2021-04-21 DIAGNOSIS — D696 Thrombocytopenia, unspecified: Secondary | ICD-10-CM | POA: Diagnosis not present

## 2021-05-11 ENCOUNTER — Encounter: Payer: Self-pay | Admitting: Internal Medicine

## 2021-05-17 DIAGNOSIS — R1011 Right upper quadrant pain: Secondary | ICD-10-CM | POA: Diagnosis not present

## 2021-06-03 ENCOUNTER — Ambulatory Visit: Payer: Medicare Other | Admitting: Cardiology

## 2021-06-03 NOTE — Progress Notes (Signed)
? ? ?GI Office Note   ? ?Referring Provider: Asencion Noble, MD ?Primary Care Physician:  Asencion Noble, MD  ?Primary Gastroenterologist: Dr. Gala Romney ? ?Chief Complaint  ? ?Chief Complaint  ?Patient presents with  ? Colonoscopy  ? ? ? ?History of Present Illness  ? ?Kipper Buch is a 71 y.o. male presenting today at the request of Asencion Noble, MD for evaluation prior to scheduling surveillance colonoscopy. ? ?Last colonoscopy February 2013 -normal rectum, single 4 mm hyperplastic polyp in the mid sigmoid, scattered sigmoid diverticula, 1.5 cm pale yellow mucosal nodule in descending segment consistent with previously noted lipoma. ? ?Has history of A-fib, follows with [facility] Dr. Harl Bowie.  He was last seen October 07, 2020 for follow-up.  He had noted a recent episode of a flutter at PCP office.  Reported this was an isolated event and felt his heart go out of rhythm and went back to normal and that night.  His PCP is started him on metoprolol.  He was currently on flecainide, diltiazem, and Eliquis.  Last EKG 10/07/2020 with bradycardia with heart rate 51.  He was continued on flecainide, diltiazem, and Eliquis.  No changes made to his antihypertensive regimen. ? ?Patient had right shoulder arthroscopy in December 2022.  He was given cardiac clearance to hold Eliquis for 3 days prior to procedure.  He has been recovering well and was following regularly with orthopedic surgery. ? ?Today: ?Some RUQ pain, sometimes will occur in the LUQ. No appendix or gallbladder. Different times, comes and goes. Pain pill makes it better. No lifting or twisting. Reports he has an umbilical hernia, no pain.  ? ?Denies N/V. Denies reflux, doing well on omeprazole 40 mg daily. No dysphagia. No unintentional weight loss or early satiety.  Denies melena or hematochezia. Denies chest pain or shortness of breath. Has been a while since he has felt his heart be out a rhythm. Denies edema.  ? ? ? ?Past Medical History:   ?Diagnosis Date  ? Anxiety   ? Cervical disc disease   ? surgery planned for 05/25/10 (Dr. Hal Neer)  ? COPD (chronic obstructive pulmonary disease) ([place])   ? Depression with anxiety   ? DJD (degenerative joint disease)   ? GERD (gastroesophageal reflux disease)   ? HTN (hypertension)   ? Paroxysmal atrial fibrillation ([facility])   ? a. flecainide therapy;  b. echo 6/09: EF normal, mild LVH;   c. Myoview 7/09: EF 59%, no ischemia  ? ? ?Past Surgical History:  ?Procedure Laterality Date  ? APPENDECTOMY    ? CHOLECYSTECTOMY    ? COLONOSCOPY  10/07/03  ? normal rectum, submucosal nodule in mid-descending colon c/w lipoma  ? COLONOSCOPY  03/22/2011  ? Procedure: COLONOSCOPY;  Surgeon: Daneil Dolin, MD;  [facility];  Service: Endoscopy;  Laterality: N/A;  2:30  ? fused C4, C5, C6    ? anterior approach  ? lumbar back surgery    ? TOTAL SHOULDER ARTHROPLASTY    ? WRIST SURGERY    ? ? ?Current Outpatient Medications  ?Medication Sig Dispense Refill  ? apixaban (ELIQUIS) 5 MG TABS tablet Take 1 tablet (5 mg total) by mouth 2 (two) times daily. 28 tablet 0  ? chlorthalidone (HYGROTON) 25 MG tablet TAKE 1/2 TABLET BY MOUTH ONCE DAILY. 45 tablet 2  ? diltiazem (CARDIZEM CD) 240 MG 24 hr capsule Take 1 capsule (240 mg total) by mouth daily. 30 capsule 0  ? escitalopram (LEXAPRO) 10 MG  tablet Take 10 mg by mouth daily.    ? fish oil-omega-3 fatty acids 1000 MG capsule Take 1 g by mouth daily.    ? flecainide (TAMBOCOR) 100 MG tablet Take 1 tablet (100 mg total) by mouth 2 (two) times daily. 90 tablet 2  ? metoprolol tartrate (LOPRESSOR) 25 MG tablet Take 0.5 tablets (12.5 mg total) by mouth 2 (two) times daily.    ? omeprazole (PRILOSEC) 40 MG capsule Take 40 mg by mouth daily.    ? oxyCODONE-acetaminophen (PERCOCET) 5-325 MG per tablet Take 1 tablet by mouth every 4 (four) hours as needed. pain    ? potassium chloride SA (KLOR-CON) 20 MEQ tablet TAKE 2 TABLETS DAILY. 180 tablet 3  ? ?No current facility-administered  medications for this visit.  ? ? ?Allergies as of 06/06/2021  ? (No Known Allergies)  ? ? ?Family History  ?Problem Relation Age of Onset  ? Cancer Mother   ? Dementia Mother   ? Hypertension Father   ? Colon polyps Sister   ? Heart attack Brother 68  ? Colon polyps Brother   ? Colon cancer Neg Hx   ? ? ?Social History  ? ?Socioeconomic History  ? Marital status: Married  ?  Spouse name: Not on file  ? Number of children: Not on file  ? Years of education: Not on file  ? Highest education level: Not on file  ?Occupational History  ? Occupation: Mining engineer  ?  Employer: [employer]   ?  Comment: [employer] in [place], [place]; been out of work 9 mos, trying to get disability due to chronic issues   ?Tobacco Use  ? Smoking status: Every Day  ?  Packs/day: 0.50  ?  Types: Cigarettes  ? Smokeless tobacco: Never  ? Tobacco comments:  ?  1 pack a day  ?Vaping Use  ? Vaping Use: Never used  ?Substance and Sexual Activity  ? Alcohol use: Yes  ?  Alcohol/week: 2.0 standard drinks  ?  Types: 2 Standard drinks or equivalent per week  ?  Comment: 12 pack a week   ? Drug use: No  ? Sexual activity: Yes  ?  Partners: Female  ?Other Topics Concern  ? Not on file  ?Social History Narrative  ? Not on file  ? ?Social Determinants of Health  ? ?Financial Resource Strain: Not on file  ?Food Insecurity: Not on file  ?Transportation Needs: Not on file  ?Physical Activity: Not on file  ?Stress: Not on file  ?Social Connections: Not on file  ?Intimate Partner Violence: Not on file  ? ? ? ?Review of Systems  ? ?Gen: Denies any fever, chills, fatigue, weight loss, lack of appetite.  ?CV: Denies chest pain, heart palpitations, peripheral edema, syncope.  ?Resp: Denies shortness of breath at rest or with exertion. Denies wheezing or cough.  ?GI: See HPI ?GU : Denies urinary burning, urinary frequency, urinary hesitancy ?MS: Denies joint pain, muscle weakness, cramps, or limitation of movement.  ?Derm: Denies rash, itching, dry skin ?Psych: Denies  depression, anxiety, memory loss, and confusion ?Heme: Denies bruising, bleeding, and enlarged lymph nodes. ? ? ?Physical Exam  ? ?BP 116/76   Pulse 65   Temp (!) 97.5 ?F (36.4 ?C) (Temporal)   Ht 6' (1.829 m)   Wt 175 lb 6.4 oz (79.6 kg)   BMI 23.79 kg/m?  ? ?General:   Alert and oriented. Pleasant and cooperative. Well-nourished and well-developed.  ?Head:  Normocephalic and atraumatic. ?Eyes:  Without  icterus, sclera clear and conjunctiva pink.  ?Ears:  Normal auditory acuity. ?Lungs:  Clear to auscultation bilaterally. No wheezes, rales, or rhonchi. No distress.  ?Heart:  S1, S2 present without murmurs appreciated.  ?Abdomen:  +BS, soft, non-tender and non-distended. No HSM noted. No guarding or rebound.  Small soft reducible umbilical hernia. ?Rectal:  Deferred  ?Msk:  Symmetrical without gross deformities. Normal posture. ?Extremities:  Without edema. ?Neurologic:  Alert and  oriented x4;  grossly normal neurologically. ?Skin:  Intact without significant lesions or rashes. ?Psych:  Alert and cooperative. Normal mood and affect. ? ? ?Assessment  ? ?Arlee Santosuosso is a 71 y.o. male with a history of cervical disc disease, COPD, depression anxiety, GERD, HTN, A-fib on Eliquis presenting today for consult schedule surveillance colonoscopy. ? ?History of colon polyps: Last colonoscopy in February 2013 with a single hyperplastic polyp and scattered diverticula.  No alarm symptoms present.  Denies any melena or hematochezia.  We will schedule surveillance colonoscopy with Dr. Gala Romney in the near future ? ?Right upper quadrant pain, intermittent: Reports the pain comes and goes but improves when he takes his Percocet.  Denies any recent injuries.  Has history of appendectomy and cholecystectomy.  Reported most recent lab work was normal with his PCP.  Likely MSK in nature.  Abdominal exam benign. ? ?GERD: Well-controlled on omeprazole 40 mg daily. ? ? ?PLAN  ? ?Proceed with surveillance colonoscopy with  propofol by Dr. Gala Romney in near future: the risks, benefits, and alternatives have been discussed with the patient in detail. The patient states understanding and desires to proceed.  ASA 3 ?Will obtain cardiac cl

## 2021-06-03 NOTE — Progress Notes (Deleted)
Clinical Summary Travis Booker is a 71 y.o.male seen today for follow up of the following medical problems.      1. Afib - on flecanide and dilt,eliquis - no recent palpitations - compliatnw with meds.No bleeding on eliquis.    -noted to be in aflutter at recent pcp visit. PCP added metoprolol - isolated episode of heart racing 10/01/20. He could feel when it went out of rhythm, went back into normal that night.  - pcp started metoprolol   2. HTN  - compliant with meds     Wife if Travis Booker also a patient of mine   Past Medical History:  Diagnosis Date   Cervical disc disease    surgery planned for 05/25/10 (Dr. Hal Neer)   COPD (chronic obstructive pulmonary disease) ([place])    Depression with anxiety    DJD (degenerative joint disease)    GERD (gastroesophageal reflux disease)    HTN (hypertension)    Paroxysmal atrial fibrillation ([place])    a. flecainide therapy;  b. echo 6/09: EF normal, mild LVH;   c. Myoview 7/09: EF 59%, no ischemia     No Known Allergies   Current Outpatient Medications  Medication Sig Dispense Refill   ALPRAZolam (XANAX) 1 MG tablet Take 1 mg by mouth at bedtime as needed. nerves     apixaban (ELIQUIS) 5 MG TABS tablet Take 1 tablet (5 mg total) by mouth 2 (two) times daily. 28 tablet 0   chlorthalidone (HYGROTON) 25 MG tablet TAKE 1/2 TABLET BY MOUTH ONCE DAILY. 45 tablet 2   diltiazem (CARDIZEM CD) 240 MG 24 hr capsule Take 1 capsule (240 mg total) by mouth daily. 30 capsule 0   escitalopram (LEXAPRO) 10 MG tablet Take 10 mg by mouth daily.     esomeprazole (NEXIUM) 40 MG capsule Take 40 mg by mouth daily at 12 noon.     fish oil-omega-3 fatty acids 1000 MG capsule Take 1 g by mouth daily.     flecainide (TAMBOCOR) 100 MG tablet Take 1 tablet (100 mg total) by mouth 2 (two) times daily. 90 tablet 2   HYDROmorphone (DILAUDID) 2 MG tablet Take 1 tablet (2 mg total) by mouth every 6 (six) hours as needed for severe pain. 30 tablet 0    HYDROmorphone (DILAUDID) 2 MG tablet Take 1 tablet (2 mg total) by mouth every 6 (six) hours as needed for severe pain. 30 tablet 0   metoprolol tartrate (LOPRESSOR) 25 MG tablet Take 0.5 tablets (12.5 mg total) by mouth 2 (two) times daily.     omeprazole (PRILOSEC) 40 MG capsule Take 80 mg by mouth daily.     oxyCODONE-acetaminophen (PERCOCET) 5-325 MG per tablet Take 1 tablet by mouth every 4 (four) hours as needed. pain     potassium chloride SA (KLOR-CON) 20 MEQ tablet TAKE 2 TABLETS DAILY. 180 tablet 3   No current facility-administered medications for this visit.     Past Surgical History:  Procedure Laterality Date   APPENDECTOMY     CHOLECYSTECTOMY     COLONOSCOPY  10/07/03   normal rectum, submucosal nodule in mid-descending colon c/w lipoma   COLONOSCOPY  03/22/2011   Procedure: COLONOSCOPY;  Surgeon: Daneil Dolin, MD;  [facility];  Service: Endoscopy;  Laterality: N/A;  2:30   fused C4, C5, C6     anterior approach   lumbar back surgery     TOTAL SHOULDER ARTHROPLASTY     WRIST SURGERY  No Known Allergies    Family History  Problem Relation Age of Onset   Cancer Mother    Dementia Mother    Heart attack Brother 8   Colon cancer Neg Hx      Social History Travis Booker reports that he has been smoking cigarettes. He has been smoking an average of .5 packs per day. He has never used smokeless tobacco. Travis Booker reports current alcohol use of about 2.0 standard drinks per week.   Review of Systems CONSTITUTIONAL: No weight loss, fever, chills, weakness or fatigue.  HEENT: Eyes: No visual loss, blurred vision, double vision or yellow sclerae.No hearing loss, sneezing, congestion, runny nose or sore throat.  SKIN: No rash or itching.  CARDIOVASCULAR:  RESPIRATORY: No shortness of breath, cough or sputum.  GASTROINTESTINAL: No anorexia, nausea, vomiting or diarrhea. No abdominal pain or blood.  GENITOURINARY: No burning on urination, no  polyuria NEUROLOGICAL: No headache, dizziness, syncope, paralysis, ataxia, numbness or tingling in the extremities. No change in bowel or bladder control.  MUSCULOSKELETAL: No muscle, back pain, joint pain or stiffness.  LYMPHATICS: No enlarged nodes. No history of splenectomy.  PSYCHIATRIC: No history of depression or anxiety.  ENDOCRINOLOGIC: No reports of sweating, cold or heat intolerance. No polyuria or polydipsia.  Marland Kitchen   Physical Examination There were no vitals filed for this visit. There were no vitals filed for this visit.  Gen: resting comfortably, no acute distress HEENT: no scleral icterus, pupils equal round and reactive, no palptable cervical adenopathy,  CV Resp: Clear to auscultation bilaterally GI: abdomen is soft, non-tender, non-distended, normal bowel sounds, no hepatosplenomegaly MSK: extremities are warm, no edema.  Skin: warm, no rash Neuro:  no focal deficits Psych: appropriate affect   Diagnostic Studies     Assessment and Plan   1. Afib  -isolated episode of palpitations,seen by pcp that day and EKG actually showed aflutter - pcp added lopressor to his flecanide and diltiazem regimen - symptoms resolved later that day, no recurrence - EKG today shows sinus brady at 51 - he reports some HRs in the 40s at home, lower lopressor to 12.'5mg'$  bid. Continue diltiazem and flecanide at current doses. If recurrences would have him see EP to reassess rhythm strategy regimen. - continue eliquis - he will call us in 1 week with HRs and bp's       Arnoldo Lenis, M.D., F.A.C.C.

## 2021-06-06 ENCOUNTER — Telehealth: Payer: Self-pay | Admitting: *Deleted

## 2021-06-06 ENCOUNTER — Encounter: Payer: Self-pay | Admitting: Gastroenterology

## 2021-06-06 ENCOUNTER — Ambulatory Visit: Payer: Medicare Other | Admitting: Gastroenterology

## 2021-06-06 VITALS — BP 116/76 | HR 65 | Temp 97.5°F | Ht 72.0 in | Wt 175.4 lb

## 2021-06-06 DIAGNOSIS — K219 Gastro-esophageal reflux disease without esophagitis: Secondary | ICD-10-CM | POA: Diagnosis not present

## 2021-06-06 DIAGNOSIS — R1011 Right upper quadrant pain: Secondary | ICD-10-CM

## 2021-06-06 DIAGNOSIS — Z86010 Personal history of colonic polyps: Secondary | ICD-10-CM

## 2021-06-06 DIAGNOSIS — I700 Atherosclerosis of aorta: Secondary | ICD-10-CM

## 2021-06-06 NOTE — Patient Instructions (Signed)
We are scheduling you for colonoscopy with propofol try to work in the near future. ? ?We reached out to cardiology to request clearance to hold her Eliquis for 2 days prior to procedure.  Our team will reach out once clearance is received to schedule your colonoscopy. ? ?Continue taking omeprazole 40 mg daily. ? ?It was a pleasure to meet you today! ? ?It was a pleasure to see you today. I want to create trusting relationships with patients. If you receive a survey regarding your visit,  I greatly appreciate you taking time to fill this out on paper or through your MyChart. I value your feedback. ? ?Venetia Night, MSN, FNP-BC, AGACNP-BC ?[facility] ? ? ?

## 2021-06-06 NOTE — Telephone Encounter (Signed)
Attention: Preop ? ? ?We would like to request holding the following medication for patient please. ? ?Procedure: Colonoscopy  ? ?Date: TBD ? ?Medication to hold: Eliquis for 2 days before procedure. ? ?Surgeon: Dr. Gala Romney  ? ?Phone: [phone] ? ?Fax:  [phone] ? ?Type of Anesthesia:  Propofol  ASA III ? ? ? ? ? ? ?  ?

## 2021-06-06 NOTE — Telephone Encounter (Signed)
Sent medication clearance. Waiting on approval.  ?

## 2021-06-07 DIAGNOSIS — I700 Atherosclerosis of aorta: Secondary | ICD-10-CM | POA: Insufficient documentation

## 2021-06-07 NOTE — Telephone Encounter (Signed)
? ? ?  Patient Name: Travis Booker  ?DOB: 11-14-1950 ?MRN: [id number] ? ?Primary Cardiologist: Carlyle Dolly, MD ? ?Clinical pharmacist have reviewed past medical history, labs, medications as part of pre-operative protocol coverage. ? ?The following recommendations have been made: ? ?Patient with diagnosis of afib on Eliquis for anticoagulation.   ?  ?Procedure: colonoscopy ?Date of procedure: TBD ?  ?CHA2DS2-VASc Score = 3  ?This indicates a 3.2% annual risk of stroke. ?The patient's score is based upon: ?CHF History: 0 ?HTN History: 1 ?Diabetes History: 0 ?Stroke History: 0 ?Vascular Disease History: 1 ?Age Score: 1 ?Gender Score: 0 ?  ?Aortic atherosclerosis noted on 03/2021 chest CT, PMH updated. ?  ?CrCl 61m/min ?Platelet count 236K ?  ?Per office protocol, patient can hold Eliquis for 2 days prior to procedure as requested.  Please resume Eliquis as soon as possible postprocedure, at the discretion of the surgeon. ? ?I will route this recommendation to the requesting party via Epic fax function and remove from pre-op pool. ? ?Please call with questions. ? ?ELenna Sciara NP ?06/07/2021, 2:31 PM ? ?

## 2021-06-07 NOTE — Telephone Encounter (Signed)
Patient with diagnosis of afib on Eliquis for anticoagulation.   ? ?Procedure: colonoscopy ?Date of procedure: TBD ? ?CHA2DS2-VASc Score = 3  ?This indicates a 3.2% annual risk of stroke. ?The patient's score is based upon: ?CHF History: 0 ?HTN History: 1 ?Diabetes History: 0 ?Stroke History: 0 ?Vascular Disease History: 1 ?Age Score: 1 ?Gender Score: 0 ?  ?Aortic atherosclerosis noted on 03/2021 chest CT, PMH updated. ? ?CrCl 40m/min ?Platelet count 236K ? ?Per office protocol, patient can hold Eliquis for 2 days prior to procedure as requested.   ?

## 2021-06-14 ENCOUNTER — Telehealth: Payer: Self-pay | Admitting: *Deleted

## 2021-06-14 NOTE — Telephone Encounter (Signed)
Will call pt to schedule TCS when Dr. Rourk's next schedule is available. °

## 2021-06-14 NOTE — Telephone Encounter (Signed)
Received medication clearance for procdeure. Copy placed on providers desk.

## 2021-06-16 ENCOUNTER — Telehealth: Payer: Self-pay | Admitting: *Deleted

## 2021-06-16 NOTE — Telephone Encounter (Signed)
LMOVM to call back to schedule TCS with Dr. Gala Romney, asa 3

## 2021-06-21 NOTE — Telephone Encounter (Signed)
Pt called office, informed him we will call back to schedule TCS when Dr. Roseanne Kaufman next schedule is available.

## 2021-06-23 ENCOUNTER — Telehealth: Payer: Self-pay | Admitting: Internal Medicine

## 2021-06-23 NOTE — Telephone Encounter (Signed)
Pt was seen by Venetia Night, NP on 06/06/2021 and he and his wife called today saying he was hurting in his side really bad and needed something done. Please call and advise with recommendations. [phone]

## 2021-06-24 NOTE — Telephone Encounter (Signed)
Spoke to pt,he informed me that he is having stomach pain. It is the right lower side. The pain is dull and comes often. Would like to know what to do? I advise him that we closed early today and that if the pain became unbearable to please go to the ED

## 2021-06-27 NOTE — Telephone Encounter (Signed)
LMOM for pt to call office back 

## 2021-06-27 NOTE — Telephone Encounter (Signed)
Spoke to pt, he informed me that he is not having any N/V or fever and chills. No constipation or diarrhea either. He informed me also that Dr. Willey Blade put him on 2 antibiotics Doxycycline and Dicyclomine. He states he has been taking them for about 5 days.

## 2021-06-30 ENCOUNTER — Telehealth: Payer: Self-pay | Admitting: *Deleted

## 2021-06-30 NOTE — Telephone Encounter (Signed)
Called pt to schedule TCS with Dr. Gala Romney, ASA 3. Date provided did not work. Advised will call once we receive august schedule

## 2021-07-01 NOTE — Telephone Encounter (Signed)
Spoke to pt, informed him of recommendations. Pt voice understanding.

## 2021-07-21 NOTE — Telephone Encounter (Signed)
LMOVM to call back to schedule 

## 2021-08-01 ENCOUNTER — Encounter: Payer: Self-pay | Admitting: *Deleted

## 2021-08-08 ENCOUNTER — Encounter: Payer: Self-pay | Admitting: *Deleted

## 2021-08-08 MED ORDER — PEG 3350-KCL-NA BICARB-NACL 420 G PO SOLR: ORAL | 0 refills | Status: DC

## 2021-08-08 NOTE — Telephone Encounter (Signed)
Spoke with pt. He has been scheduled for 8/16 at 2pm. Aware will mail prep instructions/pre-op appt. Rx sent to pharmacy. Okay to hold eliqius x 2 days prior.   PA done via [facility]. Auth# [id number], DOS: Sep 07, 2021 - Dec 06, 2021

## 2021-08-08 NOTE — Addendum Note (Signed)
Addended by: Cheron Every on: 08/08/2021 11:29 AM   Modules accepted: Orders

## 2021-08-10 DIAGNOSIS — M79642 Pain in left hand: Secondary | ICD-10-CM | POA: Diagnosis not present

## 2021-08-10 DIAGNOSIS — M65342 Trigger finger, left ring finger: Secondary | ICD-10-CM | POA: Diagnosis not present

## 2021-08-10 DIAGNOSIS — M180 Bilateral primary osteoarthritis of first carpometacarpal joints: Secondary | ICD-10-CM | POA: Diagnosis not present

## 2021-08-10 DIAGNOSIS — M79641 Pain in right hand: Secondary | ICD-10-CM | POA: Diagnosis not present

## 2021-08-18 ENCOUNTER — Telehealth: Payer: Self-pay | Admitting: Cardiology

## 2021-08-18 ENCOUNTER — Encounter: Payer: Self-pay | Admitting: *Deleted

## 2021-08-18 ENCOUNTER — Encounter: Payer: Self-pay | Admitting: Cardiology

## 2021-08-18 ENCOUNTER — Ambulatory Visit: Payer: Medicare Other | Admitting: Cardiology

## 2021-08-18 VITALS — BP 110/74 | HR 54 | Ht 72.0 in | Wt 172.8 lb

## 2021-08-18 DIAGNOSIS — I4891 Unspecified atrial fibrillation: Secondary | ICD-10-CM

## 2021-08-18 DIAGNOSIS — Z136 Encounter for screening for cardiovascular disorders: Secondary | ICD-10-CM | POA: Diagnosis not present

## 2021-08-18 DIAGNOSIS — I10 Essential (primary) hypertension: Secondary | ICD-10-CM | POA: Diagnosis not present

## 2021-08-18 DIAGNOSIS — R079 Chest pain, unspecified: Secondary | ICD-10-CM | POA: Diagnosis not present

## 2021-08-18 NOTE — Progress Notes (Signed)
Clinical Summary Mr. Ehmke is a 71 y.o.male seen today for follow up of the following medical problems.      1. Afib - on flecanide and dilt,eliquis -previously noted to be in aflutter at recent pcp visit. PCP added metoprolol  - no recent symptoms - complinat with meds     2. HTN  - compliant with meds   3. Chest pain - sharp pain, left sided 5/10 in severity. Can occur at rest or with activity. No other associated symptoms. At times better with better with position. Lasts just a few minutes at a time. Occurs 2-3 times per week - highest activity is housework, no exertional symptoms.    CAD risk factors; HTN, smoker x 57 years, aortic atherosclerosis    AAA screening: does not appear has had.      Wife if Laithan Conchas also a patient of mine    Past Medical History:  Diagnosis Date   Anxiety    Cervical disc disease    surgery planned for 05/25/10 (Dr. Hal Neer)   COPD (chronic obstructive pulmonary disease) ([place])    Depression with anxiety    DJD (degenerative joint disease)    GERD (gastroesophageal reflux disease)    HTN (hypertension)    Paroxysmal atrial fibrillation (HCC)    a. flecainide therapy;  b. echo 6/09: EF normal, mild LVH;   c. Myoview 7/09: EF 59%, no ischemia     No Known Allergies   Current Outpatient Medications  Medication Sig Dispense Refill   apixaban (ELIQUIS) 5 MG TABS tablet Take 1 tablet (5 mg total) by mouth 2 (two) times daily. 28 tablet 0   chlorthalidone (HYGROTON) 25 MG tablet TAKE 1/2 TABLET BY MOUTH ONCE DAILY. 45 tablet 2   diltiazem (CARDIZEM CD) 240 MG 24 hr capsule Take 1 capsule (240 mg total) by mouth daily. 30 capsule 0   escitalopram (LEXAPRO) 10 MG tablet Take 10 mg by mouth daily.     fish oil-omega-3 fatty acids 1000 MG capsule Take 1 g by mouth daily.     flecainide (TAMBOCOR) 100 MG tablet Take 1 tablet (100 mg total) by mouth 2 (two) times daily. 90 tablet 2   metoprolol tartrate (LOPRESSOR) 25 MG tablet  Take 0.5 tablets (12.5 mg total) by mouth 2 (two) times daily.     omeprazole (PRILOSEC) 40 MG capsule Take 40 mg by mouth daily.     oxyCODONE-acetaminophen (PERCOCET) 5-325 MG per tablet Take 1 tablet by mouth every 4 (four) hours as needed. pain     polyethylene glycol-electrolytes (NULYTELY) 420 g solution As directed 4000 mL 0   potassium chloride SA (KLOR-CON) 20 MEQ tablet TAKE 2 TABLETS DAILY. 180 tablet 3   No current facility-administered medications for this visit.     Past Surgical History:  Procedure Laterality Date   APPENDECTOMY     CHOLECYSTECTOMY     COLONOSCOPY  10/07/03   normal rectum, submucosal nodule in mid-descending colon c/w lipoma   COLONOSCOPY  03/22/2011   Procedure: COLONOSCOPY;  Surgeon: Daneil Dolin, MD;  Location: [facility];  Service: Endoscopy;  Laterality: N/A;  2:30   fused C4, C5, C6     anterior approach   lumbar back surgery     TOTAL SHOULDER ARTHROPLASTY     WRIST SURGERY       No Known Allergies    Family History  Problem Relation Age of Onset   Cancer Mother    Dementia Mother  Hypertension Father    Colon polyps Sister    Heart attack Brother 26   Colon polyps Brother    Colon cancer Neg Hx      Social History Mr. Nham reports that he has been smoking cigarettes. He has been smoking an average of .5 packs per day. He has never used smokeless tobacco. Mr. Seier reports current alcohol use of about 2.0 standard drinks of alcohol per week.   Review of Systems CONSTITUTIONAL: No weight loss, fever, chills, weakness or fatigue.  HEENT: Eyes: No visual loss, blurred vision, double vision or yellow sclerae.No hearing loss, sneezing, congestion, runny nose or sore throat.  SKIN: No rash or itching.  CARDIOVASCULAR: per hpi RESPIRATORY: No shortness of breath, cough or sputum.  GASTROINTESTINAL: No anorexia, nausea, vomiting or diarrhea. No abdominal pain or blood.  GENITOURINARY: No burning on urination, no  polyuria NEUROLOGICAL: No headache, dizziness, syncope, paralysis, ataxia, numbness or tingling in the extremities. No change in bowel or bladder control.  MUSCULOSKELETAL: No muscle, back pain, joint pain or stiffness.  LYMPHATICS: No enlarged nodes. No history of splenectomy.  PSYCHIATRIC: No history of depression or anxiety.  ENDOCRINOLOGIC: No reports of sweating, cold or heat intolerance. No polyuria or polydipsia.  Marland Kitchen   Physical Examination Today's Vitals   08/18/21 1608  BP: 110/74  Pulse: (!) 54  SpO2: 94%  Weight: 172 lb 12.8 oz (78.4 kg)  Height: 6' (1.829 m)   Body mass index is 23.44 kg/m.  Gen: resting comfortably, no acute distress HEENT: no scleral icterus, pupils equal round and reactive, no palptable cervical adenopathy,  CV: RRR, no m/rg no jvd Resp: Clear to auscultation bilaterally GI: abdomen is soft, non-tender, non-distended, normal bowel sounds, no hepatosplenomegaly MSK: extremities are warm, no edema.  Skin: warm, no rash Neuro:  no focal deficits Psych: appropriate affect   Diagnostic Studies     Assessment and Plan    1. Afib  -doing well on current meds, continue current meds including eliquis for stroke prevention  2. Chest pain - unclear etiology, multiple CAD risk factors plan for lexiscan to further evaluate - EKG shows SR, no ischemic changes  3. HTN -at goal, continue current meds   4.AAA screen - male over 19 with tobacco history, order AAA screening US Arnoldo Lenis, M.D.

## 2021-08-18 NOTE — Telephone Encounter (Signed)
Checking percert on the following patient for testing scheduled at [facility].    LEXISCAN   08/26/2021

## 2021-08-18 NOTE — Patient Instructions (Signed)
Medication Instructions:  Continue all current medications.  Labwork: none  Testing/Procedures: Your physician has requested that you have a lexiscan myoview. For further information please visit [email or web address]. Please follow instruction sheet, as given. Your physician has requested that you have an abdominal aorta duplex. During this test, an ultrasound is used to evaluate the aorta. Allow 30 minutes for this exam. Do not eat after midnight the day before and avoid carbonated beverages  Office will contact with results via phone, letter or mychart.    Follow-Up: 6 months   Any Other Special Instructions Will Be Listed Below (If Applicable).   If you need a refill on your cardiac medications before your next appointment, please call your pharmacy.

## 2021-08-26 ENCOUNTER — Encounter (HOSPITAL_COMMUNITY): Payer: Medicare Other

## 2021-08-30 NOTE — Patient Instructions (Signed)
[facility]  08/30/2021     '@PREFPERIOPPHARMACY'$ @   Your procedure is scheduled on  09/07/2021.   Report to [facility] at  1200  P.M.   Call this number if you have problems the morning of surgery:  [phone]   Remember:  Follow the diet and prep instructions given to you by the office.     Your last dose of eliquis should be on 09/04/2021.      Use your inhaler before you come and bring your rescue inhaler with you.     Take these medicines the morning of surgery with A SIP OF WATER             cardiazem, lexapro, tambocor, lopressor, prilosec, percocet(if needed).     Do not wear jewelry, make-up or nail polish.  Do not wear lotions, powders, or perfumes, or deodorant.  Do not shave 48 hours prior to surgery.  Men may shave face and neck.  Do not bring valuables to the hospital.  [facility] is not responsible for any belongings or valuables.  Contacts, dentures or bridgework may not be worn into surgery.  Leave your suitcase in the car.  After surgery it may be brought to your room.  For patients admitted to the hospital, discharge time will be determined by your treatment team.  Patients discharged the day of surgery will not be allowed to drive home and must have someone with them for 24 hours.    Special instructions:   DO NOT smoke tobacco or vape for 24 hours before your procedure.  Please read over the following fact sheets that you were given. Anesthesia Post-op Instructions and Care and Recovery After Surgery      Colonoscopy, Adult, Care After The following information offers guidance on how to care for yourself after your procedure. Your health care provider may also give you more specific instructions. If you have problems or questions, contact your health care provider. What can I expect after the procedure? After the procedure, it is common to have: A small amount of blood in your stool for 24 hours after the procedure. Some  gas. Mild cramping or bloating of your abdomen. Follow these instructions at home: Eating and drinking  Drink enough fluid to keep your urine pale yellow. Follow instructions from your health care provider about eating or drinking restrictions. Resume your normal diet as told by your health care provider. Avoid heavy or fried foods that are hard to digest. Activity Rest as told by your health care provider. Avoid sitting for a long time without moving. Get up to take short walks every 1-2 hours. This is important to improve blood flow and breathing. Ask for help if you feel weak or unsteady. Return to your normal activities as told by your health care provider. Ask your health care provider what activities are safe for you. Managing cramping and bloating  Try walking around when you have cramps or feel bloated. If directed, apply heat to your abdomen as told by your health care provider. Use the heat source that your health care provider recommends, such as a moist heat pack or a heating pad. Place a towel between your skin and the heat source. Leave the heat on for 20-30 minutes. Remove the heat if your skin turns bright red. This is especially important if you are unable to feel pain, heat, or cold. You have a greater risk of getting burned. General instructions If you  were given a sedative during the procedure, it can affect you for several hours. Do not drive or operate machinery until your health care provider says that it is safe. For the first 24 hours after the procedure: Do not sign important documents. Do not drink alcohol. Do your regular daily activities at a slower pace than normal. Eat soft foods that are easy to digest. Take over-the-counter and prescription medicines only as told by your health care provider. Keep all follow-up visits. This is important. Contact a health care provider if: You have blood in your stool 2-3 days after the procedure. Get help right away  if: You have more than a small spotting of blood in your stool. You have large blood clots in your stool. You have swelling of your abdomen. You have nausea or vomiting. You have a fever. You have increasing pain in your abdomen that is not relieved with medicine. These symptoms may be an emergency. Get help right away. Call 911. Do not wait to see if the symptoms will go away. Do not drive yourself to the hospital. Summary After the procedure, it is common to have a small amount of blood in your stool. You may also have mild cramping and bloating of your abdomen. If you were given a sedative during the procedure, it can affect you for several hours. Do not drive or operate machinery until your health care provider says that it is safe. Get help right away if you have a lot of blood in your stool, nausea or vomiting, a fever, or increased pain in your abdomen. This information is not intended to replace advice given to you by your health care provider. Make sure you discuss any questions you have with your health care provider. Document Revised: 09/01/2020 Document Reviewed: 09/01/2020 Elsevier Patient Education  [place] After This sheet gives you information about how to care for yourself after your procedure. Your health care provider may also give you more specific instructions. If you have problems or questions, contact your health care provider. What can I expect after the procedure? After the procedure, it is common to have: Tiredness. Forgetfulness about what happened after the procedure. Impaired judgment for important decisions. Nausea or vomiting. Some difficulty with balance. Follow these instructions at home: For the time period you were told by your health care provider:     Rest as needed. Do not participate in activities where you could fall or become injured. Do not drive or use machinery. Do not drink alcohol. Do not take  sleeping pills or medicines that cause drowsiness. Do not make important decisions or sign legal documents. Do not take care of children on your own. Eating and drinking Follow the diet that is recommended by your health care provider. Drink enough fluid to keep your urine pale yellow. If you vomit: Drink water, juice, or soup when you can drink without vomiting. Make sure you have little or no nausea before eating solid foods. General instructions Have a responsible adult stay with you for the time you are told. It is important to have someone help care for you until you are awake and alert. Take over-the-counter and prescription medicines only as told by your health care provider. If you have sleep apnea, surgery and certain medicines can increase your risk for breathing problems. Follow instructions from your health care provider about wearing your sleep device: Anytime you are sleeping, including during daytime naps. While taking prescription pain medicines, sleeping  medicines, or medicines that make you drowsy. Avoid smoking. Keep all follow-up visits as told by your health care provider. This is important. Contact a health care provider if: You keep feeling nauseous or you keep vomiting. You feel light-headed. You are still sleepy or having trouble with balance after 24 hours. You develop a rash. You have a fever. You have redness or swelling around the IV site. Get help right away if: You have trouble breathing. You have new-onset confusion at home. Summary For several hours after your procedure, you may feel tired. You may also be forgetful and have poor judgment. Have a responsible adult stay with you for the time you are told. It is important to have someone help care for you until you are awake and alert. Rest as told. Do not drive or operate machinery. Do not drink alcohol or take sleeping pills. Get help right away if you have trouble breathing, or if you suddenly become  confused. This information is not intended to replace advice given to you by your health care provider. Make sure you discuss any questions you have with your health care provider. Document Revised: 12/14/2020 Document Reviewed: 12/12/2018 Elsevier Patient Education  [place].

## 2021-09-01 ENCOUNTER — Encounter (HOSPITAL_COMMUNITY)
Admission: RE | Admit: 2021-09-01 | Discharge: 2021-09-01 | Disposition: A | Discharge status: Home / Self Care | Payer: Medicare Other | Source: Ambulatory Visit | Attending: Internal Medicine | Admitting: Internal Medicine

## 2021-09-01 DIAGNOSIS — Z79899 Other long term (current) drug therapy: Secondary | ICD-10-CM

## 2021-09-01 DIAGNOSIS — I4891 Unspecified atrial fibrillation: Secondary | ICD-10-CM

## 2021-09-01 DIAGNOSIS — I10 Essential (primary) hypertension: Secondary | ICD-10-CM

## 2021-09-02 NOTE — Patient Instructions (Signed)
[facility]  09/02/2021     '@PREFPERIOPPHARMACY'$ @   Your procedure is scheduled on  09/07/2021.   Report to [facility] at  1200 P.M.   Call this number if you have problems the morning of surgery:  [phone]   Remember:  Follow the diet and prep instructions given to you by the office.     Your last dose of eliquis should have been on 09/04/2021.      Use your inhaler before you come and bring your rescue inhaler with you.     Take these medicines the morning of surgery with A SIP OF WATER           cardiazem, lexapro, tambocor, lopressor, prilosec, percocet(if needed).     Do not wear jewelry, make-up or nail polish.  Do not wear lotions, powders, or perfumes, or deodorant.  Do not shave 48 hours prior to surgery.  Men may shave face and neck.  Do not bring valuables to the hospital.  [facility] is not responsible for any belongings or valuables.  Contacts, dentures or bridgework may not be worn into surgery.  Leave your suitcase in the car.  After surgery it may be brought to your room.  For patients admitted to the hospital, discharge time will be determined by your treatment team.  Patients discharged the day of surgery will not be allowed to drive home and must have someone with them for 24 hours.    Special instructions:   DO NOT smoke tobacco or vape for 24 hours before your procedure.  Please read over the following fact sheets that you were given. Anesthesia Post-op Instructions and Care and Recovery After Surgery      Colonoscopy, Adult, Care After The following information offers guidance on how to care for yourself after your procedure. Your health care provider may also give you more specific instructions. If you have problems or questions, contact your health care provider. What can I expect after the procedure? After the procedure, it is common to have: A small amount of blood in your stool for 24 hours after the  procedure. Some gas. Mild cramping or bloating of your abdomen. Follow these instructions at home: Eating and drinking  Drink enough fluid to keep your urine pale yellow. Follow instructions from your health care provider about eating or drinking restrictions. Resume your normal diet as told by your health care provider. Avoid heavy or fried foods that are hard to digest. Activity Rest as told by your health care provider. Avoid sitting for a long time without moving. Get up to take short walks every 1-2 hours. This is important to improve blood flow and breathing. Ask for help if you feel weak or unsteady. Return to your normal activities as told by your health care provider. Ask your health care provider what activities are safe for you. Managing cramping and bloating  Try walking around when you have cramps or feel bloated. If directed, apply heat to your abdomen as told by your health care provider. Use the heat source that your health care provider recommends, such as a moist heat pack or a heating pad. Place a towel between your skin and the heat source. Leave the heat on for 20-30 minutes. Remove the heat if your skin turns bright red. This is especially important if you are unable to feel pain, heat, or cold. You have a greater risk of getting burned. General instructions If you were given  a sedative during the procedure, it can affect you for several hours. Do not drive or operate machinery until your health care provider says that it is safe. For the first 24 hours after the procedure: Do not sign important documents. Do not drink alcohol. Do your regular daily activities at a slower pace than normal. Eat soft foods that are easy to digest. Take over-the-counter and prescription medicines only as told by your health care provider. Keep all follow-up visits. This is important. Contact a health care provider if: You have blood in your stool 2-3 days after the procedure. Get  help right away if: You have more than a small spotting of blood in your stool. You have large blood clots in your stool. You have swelling of your abdomen. You have nausea or vomiting. You have a fever. You have increasing pain in your abdomen that is not relieved with medicine. These symptoms may be an emergency. Get help right away. Call 911. Do not wait to see if the symptoms will go away. Do not drive yourself to the hospital. Summary After the procedure, it is common to have a small amount of blood in your stool. You may also have mild cramping and bloating of your abdomen. If you were given a sedative during the procedure, it can affect you for several hours. Do not drive or operate machinery until your health care provider says that it is safe. Get help right away if you have a lot of blood in your stool, nausea or vomiting, a fever, or increased pain in your abdomen. This information is not intended to replace advice given to you by your health care provider. Make sure you discuss any questions you have with your health care provider. Document Revised: 09/01/2020 Document Reviewed: 09/01/2020 Elsevier Patient Education  [facility] After This sheet gives you information about how to care for yourself after your procedure. Your health care provider may also give you more specific instructions. If you have problems or questions, contact your health care provider. What can I expect after the procedure? After the procedure, it is common to have: Tiredness. Forgetfulness about what happened after the procedure. Impaired judgment for important decisions. Nausea or vomiting. Some difficulty with balance. Follow these instructions at home: For the time period you were told by your health care provider:     Rest as needed. Do not participate in activities where you could fall or become injured. Do not drive or use machinery. Do not drink  alcohol. Do not take sleeping pills or medicines that cause drowsiness. Do not make important decisions or sign legal documents. Do not take care of children on your own. Eating and drinking Follow the diet that is recommended by your health care provider. Drink enough fluid to keep your urine pale yellow. If you vomit: Drink water, juice, or soup when you can drink without vomiting. Make sure you have little or no nausea before eating solid foods. General instructions Have a responsible adult stay with you for the time you are told. It is important to have someone help care for you until you are awake and alert. Take over-the-counter and prescription medicines only as told by your health care provider. If you have sleep apnea, surgery and certain medicines can increase your risk for breathing problems. Follow instructions from your health care provider about wearing your sleep device: Anytime you are sleeping, including during daytime naps. While taking prescription pain medicines, sleeping medicines, or  medicines that make you drowsy. Avoid smoking. Keep all follow-up visits as told by your health care provider. This is important. Contact a health care provider if: You keep feeling nauseous or you keep vomiting. You feel light-headed. You are still sleepy or having trouble with balance after 24 hours. You develop a rash. You have a fever. You have redness or swelling around the IV site. Get help right away if: You have trouble breathing. You have new-onset confusion at home. Summary For several hours after your procedure, you may feel tired. You may also be forgetful and have poor judgment. Have a responsible adult stay with you for the time you are told. It is important to have someone help care for you until you are awake and alert. Rest as told. Do not drive or operate machinery. Do not drink alcohol or take sleeping pills. Get help right away if you have trouble breathing, or if  you suddenly become confused. This information is not intended to replace advice given to you by your health care provider. Make sure you discuss any questions you have with your health care provider. Document Revised: 12/14/2020 Document Reviewed: 12/12/2018 Elsevier Patient Education  [place].

## 2021-09-05 ENCOUNTER — Other Ambulatory Visit: Payer: Self-pay

## 2021-09-05 ENCOUNTER — Encounter (HOSPITAL_COMMUNITY): Payer: Self-pay

## 2021-09-05 ENCOUNTER — Encounter (HOSPITAL_COMMUNITY)
Admission: RE | Admit: 2021-09-05 | Discharge: 2021-09-05 | Disposition: A | Discharge status: Home / Self Care | Payer: Medicare Other | Source: Ambulatory Visit | Attending: Internal Medicine | Admitting: Internal Medicine

## 2021-09-05 DIAGNOSIS — Z01812 Encounter for preprocedural laboratory examination: Secondary | ICD-10-CM | POA: Insufficient documentation

## 2021-09-05 DIAGNOSIS — Z79899 Other long term (current) drug therapy: Secondary | ICD-10-CM | POA: Diagnosis not present

## 2021-09-05 LAB — BASIC METABOLIC PANEL WITH GFR
Anion gap: 7 (ref 5–15)
BUN: 22 mg/dL (ref 8–23)
CO2: 29 mmol/L (ref 22–32)
Calcium: 8.7 mg/dL — ABNORMAL LOW (ref 8.9–10.3)
Chloride: 102 mmol/L (ref 98–111)
Creatinine, Ser: 0.77 mg/dL (ref 0.61–1.24)
GFR, Estimated: >60 mL/min (ref >60)
Glucose, Bld: 104 mg/dL — ABNORMAL HIGH (ref 70–99)
Potassium: 3.4 mmol/L — ABNORMAL LOW (ref 3.5–5.1)
Sodium: 138 mmol/L (ref 135–145)

## 2021-09-06 ENCOUNTER — Encounter: Payer: Self-pay | Admitting: Cardiology

## 2021-09-07 ENCOUNTER — Ambulatory Visit (HOSPITAL_COMMUNITY): Payer: Medicare Other | Admitting: Anesthesiology

## 2021-09-07 ENCOUNTER — Encounter (HOSPITAL_COMMUNITY)
Admission: RE | Disposition: A | Discharge status: Home / Self Care | Payer: Self-pay | Source: Home / Self Care | Attending: Internal Medicine

## 2021-09-07 ENCOUNTER — Ambulatory Visit (HOSPITAL_COMMUNITY)
Admission: RE | Admit: 2021-09-07 | Discharge: 2021-09-07 | Disposition: A | Discharge status: Home / Self Care | Payer: Medicare Other | Attending: Internal Medicine | Admitting: Internal Medicine

## 2021-09-07 ENCOUNTER — Encounter (HOSPITAL_COMMUNITY): Payer: Self-pay | Admitting: Internal Medicine

## 2021-09-07 ENCOUNTER — Ambulatory Visit (HOSPITAL_BASED_OUTPATIENT_CLINIC_OR_DEPARTMENT_OTHER): Payer: Medicare Other | Admitting: Anesthesiology

## 2021-09-07 ENCOUNTER — Other Ambulatory Visit: Payer: Self-pay

## 2021-09-07 DIAGNOSIS — K219 Gastro-esophageal reflux disease without esophagitis: Secondary | ICD-10-CM | POA: Diagnosis not present

## 2021-09-07 DIAGNOSIS — Z86010 Personal history of colonic polyps: Secondary | ICD-10-CM

## 2021-09-07 DIAGNOSIS — D120 Benign neoplasm of cecum: Secondary | ICD-10-CM | POA: Insufficient documentation

## 2021-09-07 DIAGNOSIS — Z79899 Other long term (current) drug therapy: Secondary | ICD-10-CM | POA: Insufficient documentation

## 2021-09-07 DIAGNOSIS — Z1211 Encounter for screening for malignant neoplasm of colon: Secondary | ICD-10-CM | POA: Insufficient documentation

## 2021-09-07 DIAGNOSIS — K635 Polyp of colon: Secondary | ICD-10-CM | POA: Diagnosis not present

## 2021-09-07 DIAGNOSIS — Z87891 Personal history of nicotine dependence: Secondary | ICD-10-CM | POA: Diagnosis not present

## 2021-09-07 DIAGNOSIS — D122 Benign neoplasm of ascending colon: Secondary | ICD-10-CM | POA: Insufficient documentation

## 2021-09-07 DIAGNOSIS — D128 Benign neoplasm of rectum: Secondary | ICD-10-CM | POA: Insufficient documentation

## 2021-09-07 DIAGNOSIS — I10 Essential (primary) hypertension: Secondary | ICD-10-CM | POA: Diagnosis not present

## 2021-09-07 DIAGNOSIS — D124 Benign neoplasm of descending colon: Secondary | ICD-10-CM | POA: Diagnosis not present

## 2021-09-07 DIAGNOSIS — Z1212 Encounter for screening for malignant neoplasm of rectum: Secondary | ICD-10-CM | POA: Diagnosis not present

## 2021-09-07 DIAGNOSIS — I4891 Unspecified atrial fibrillation: Secondary | ICD-10-CM | POA: Diagnosis not present

## 2021-09-07 DIAGNOSIS — J449 Chronic obstructive pulmonary disease, unspecified: Secondary | ICD-10-CM | POA: Diagnosis not present

## 2021-09-07 DIAGNOSIS — M1990 Unspecified osteoarthritis, unspecified site: Secondary | ICD-10-CM | POA: Insufficient documentation

## 2021-09-07 HISTORY — PX: COLONOSCOPY WITH PROPOFOL: SHX5780

## 2021-09-07 HISTORY — PX: POLYPECTOMY: SHX5525

## 2021-09-07 SURGERY — COLONOSCOPY WITH PROPOFOL
Anesthesia: General

## 2021-09-07 MED ORDER — LACTATED RINGERS IV SOLN: INTRAVENOUS | Status: DC

## 2021-09-07 MED ORDER — PROPOFOL 10 MG/ML IV BOLUS
INTRAVENOUS | Status: DC | PRN
  Administered 2021-09-07: 100 mg via INTRAVENOUS

## 2021-09-07 MED ORDER — SPOT INK MARKER SYRINGE KIT
PACK | SUBMUCOSAL | Status: AC
  Filled 2021-09-07: qty 5

## 2021-09-07 MED ORDER — GLYCOPYRROLATE PF 0.2 MG/ML IJ SOSY
PREFILLED_SYRINGE | INTRAMUSCULAR | Status: DC | PRN
  Administered 2021-09-07: .2 mg via INTRAVENOUS

## 2021-09-07 MED ORDER — LIDOCAINE 2% (20 MG/ML) 5 ML SYRINGE
INTRAMUSCULAR | Status: DC | PRN
  Administered 2021-09-07: 50 mg via INTRAVENOUS

## 2021-09-07 MED ORDER — SODIUM CHLORIDE FLUSH 0.9 % IV SOLN
INTRAVENOUS | Status: AC
  Filled 2021-09-07: qty 20

## 2021-09-07 MED ORDER — EPHEDRINE SULFATE-NACL 50-0.9 MG/10ML-% IV SOSY
PREFILLED_SYRINGE | INTRAVENOUS | Status: DC | PRN
  Administered 2021-09-07: 10 mg via INTRAVENOUS

## 2021-09-07 MED ORDER — PROPOFOL 500 MG/50ML IV EMUL
INTRAVENOUS | Status: DC | PRN
  Administered 2021-09-07: 200 ug/kg/min via INTRAVENOUS

## 2021-09-07 NOTE — Anesthesia Postprocedure Evaluation (Signed)
Anesthesia Post Note  Patient: Kayan Blissett  Procedure(s) Performed: COLONOSCOPY WITH PROPOFOL POLYPECTOMY  Patient location during evaluation: Phase II Anesthesia Type: General Level of consciousness: awake and alert and oriented Pain management: pain level controlled Vital Signs Assessment: post-procedure vital signs reviewed and stable Respiratory status: spontaneous breathing, nonlabored ventilation and respiratory function stable Cardiovascular status: blood pressure returned to baseline and stable Postop Assessment: no apparent nausea or vomiting Anesthetic complications: no   No notable events documented.   Last Vitals:  Vitals:   09/07/21 1229 09/07/21 1527  BP: 128/73 (!) 134/56  Pulse: (!) 48 (!) 55  Resp: 13 15  Temp: 36.8 C 36.6 C  SpO2: 96% 100%    Last Pain:  Vitals:   09/07/21 1527  TempSrc: Oral  PainSc: 0-No pain                 Josilyn Shippee C Tallulah Hosman

## 2021-09-07 NOTE — H&P (Signed)
$'@LOGO'c$ @   Primary Care Physician:  Asencion Noble, MD Primary Gastroenterologist:  Dr. Gala Romney  Pre-Procedure History & Physical: HPI:  Travis Booker is a 71 y.o. male is here for a screening colonoscopy. Negative colonoscopy (hyperplastic polyp) 10 years ago.  Past Medical History:  Diagnosis Date   Anxiety    Cervical disc disease    surgery planned for 05/25/10 (Dr. Hal Neer)   COPD (chronic obstructive pulmonary disease) ([place])    Depression with anxiety    DJD (degenerative joint disease)    GERD (gastroesophageal reflux disease)    HTN (hypertension)    Paroxysmal atrial fibrillation ([facility])    a. flecainide therapy;  b. echo 6/09: EF normal, mild LVH;   c. Myoview 7/09: EF 59%, no ischemia    Past Surgical History:  Procedure Laterality Date   APPENDECTOMY     CHOLECYSTECTOMY     COLONOSCOPY  10/07/2003   normal rectum, submucosal nodule in mid-descending colon c/w lipoma   COLONOSCOPY  03/22/2011   Procedure: COLONOSCOPY;  Surgeon: Daneil Dolin, MD;  Location: [facility];  Service: Endoscopy;  Laterality: N/A;  2:30   fused C4, C5, C6     anterior approach   lumbar back surgery     TOTAL SHOULDER ARTHROPLASTY Left    WRIST SURGERY Left     Prior to Admission medications   Medication Sig Start Date End Date Taking? Authorizing Provider  albuterol (VENTOLIN HFA) 108 (90 Base) MCG/ACT inhaler Inhale 1-2 puffs into the lungs every 6 (six) hours as needed for wheezing or shortness of breath.   Yes [provider]  apixaban (ELIQUIS) 5 MG TABS tablet Take 1 tablet (5 mg total) by mouth 2 (two) times daily. 03/03/21  Yes Branch, Alphonse Guild, MD  chlorthalidone (HYGROTON) 25 MG tablet TAKE 1/2 TABLET BY MOUTH ONCE DAILY. 04/20/21  Yes Branch, Alphonse Guild, MD  diltiazem (CARDIZEM CD) 240 MG 24 hr capsule Take 1 capsule (240 mg total) by mouth daily. 01/12/15  Yes BranchAlphonse Guild, MD  escitalopram (LEXAPRO) 10 MG tablet Take 10 mg by mouth daily.   Yes  [provider]  flecainide (TAMBOCOR) 100 MG tablet Take 1 tablet (100 mg total) by mouth 2 (two) times daily. 10/16/12  Yes BranchAlphonse Guild, MD  metoprolol tartrate (LOPRESSOR) 25 MG tablet Take 0.5 tablets (12.5 mg total) by mouth 2 (two) times daily. 10/07/20  Yes BranchAlphonse Guild, MD  Multiple Vitamins-Minerals (WOMENS MULTIVITAMIN) TABS Take 1 tablet by mouth every other day.   Yes [provider]  omeprazole (PRILOSEC) 40 MG capsule Take 40 mg by mouth daily. 09/21/20  Yes [provider]  oxyCODONE-acetaminophen (PERCOCET) 10-325 MG tablet Take 1 tablet by mouth every 4 (four) hours as needed for moderate pain. pain   Yes [provider]  rosuvastatin (CRESTOR) 20 MG tablet Take 20 mg by mouth at bedtime. 07/29/21  Yes [provider]  polyethylene glycol-electrolytes (NULYTELY) 420 g solution As directed Patient not taking: Reported on 08/18/2021 08/08/21   Rakesh Dutko, [name], MD  potassium chloride SA (KLOR-CON) 20 MEQ tablet TAKE 2 TABLETS DAILY. Patient not taking: Reported on 08/18/2021 04/28/20   Arnoldo Lenis, MD    Allergies as of 08/08/2021   (No Known Allergies)    Family History  Problem Relation Age of Onset   Cancer Mother    Dementia Mother    Hypertension Father    Colon polyps Sister    Heart attack Brother 15  Colon polyps Brother    Colon cancer Neg Hx     Social History   Socioeconomic History   Marital status: Married    Spouse name: Not on file   Number of children: Not on file   Years of education: Not on file   Highest education level: Not on file  Occupational History   Occupation: IT sales professional: [employer]     Comment: [employer] in [place], [place]; been out of work 9 mos, trying to get disability due to chronic issues   Tobacco Use   Smoking status: Former    Packs/day: 0.50    Years: 57.00    Total pack years: 28.50    Types: Cigarettes    Quit date: 08/03/2021    Years since quitting:  0.0   Smokeless tobacco: Never  Vaping Use   Vaping Use: Never used  Substance and Sexual Activity   Alcohol use: Not Currently    Comment: stopped drinking 1 year  sgo as of 08/2021.   Drug use: No   Sexual activity: Yes    Partners: Female  Other Topics Concern   Not on file  Social History Narrative   Not on file   Social Determinants of Health   Financial Resource Strain: Not on file  Food Insecurity: Not on file  Transportation Needs: Not on file  Physical Activity: Not on file  Stress: Not on file  Social Connections: Not on file  Intimate Partner Violence: Not on file    Review of Systems: See HPI, otherwise negative ROS  Physical Exam: BP 128/73   Pulse (!) 48   Temp 98.2 F (36.8 C) (Oral)   Resp 13   SpO2 96%  General:   Alert,  Well-developed, well-nourished, pleasant and cooperative in NAD Head:  Normocephalic and atraumatic. Eyes:  Sclera clear, no icterus.   Conjunctiva pink. Ears:  Normal auditory acuity. Nose:  No deformity, discharge,  or lesions. Mouth:  No deformity or lesions, dentition normal. Neck:  Supple; no masses or thyromegaly. Lungs:  Clear throughout to auscultation.   No wheezes, crackles, or rhonchi. No acute distress. Heart:  Regular rate and rhythm; no murmurs, clicks, rubs,  or gallops. Abdomen:  Soft, nontender and nondistended. No masses, hepatosplenomegaly or hernias noted. Normal bowel sounds, without guarding, and without rebound.   Msk:  Symmetrical without gross deformities. Normal posture. Pulses:  Normal pulses noted. Extremities:  Without clubbing or edema. Neurologic:  Alert and  oriented x4;  grossly normal neurologically. Skin:  Intact without significant lesions or rashes. Cervical Nodes:  No significant cervical adenopathy. Psych:  Alert and cooperative. Normal mood and affect.  Impression/Plan: Travis Booker is now here to undergo a screening colonoscopy.  Risks, benefits, limitations, imponderables  and alternatives regarding colonoscopy have been reviewed with the patient. Questions have been answered. All parties agreeable.     Notice:  This dictation was prepared with Dragon dictation along with smaller phrase technology. Any transcriptional errors that result from this process are unintentional and may not be corrected upon review.

## 2021-09-07 NOTE — Discharge Instructions (Signed)
  Colonoscopy Discharge Instructions  Read the instructions outlined below and refer to this sheet in the next few weeks. These discharge instructions provide you with general information on caring for yourself after you leave the hospital. Your doctor may also give you specific instructions. While your treatment has been planned according to the most current medical practices available, unavoidable complications occasionally occur. If you have any problems or questions after discharge, call Dr. Gala Romney at [phone]. ACTIVITY You may resume your regular activity, but move at a slower pace for the next 24 hours.  Take frequent rest periods for the next 24 hours.  Walking will help get rid of the air and reduce the bloated feeling in your belly (abdomen).  No driving for 24 hours (because of the medicine (anesthesia) used during the test).   Do not sign any important legal documents or operate any machinery for 24 hours (because of the anesthesia used during the test).  NUTRITION Drink plenty of fluids.  You may resume your normal diet as instructed by your doctor.  Begin with a light meal and progress to your normal diet. Heavy or fried foods are harder to digest and may make you feel sick to your stomach (nauseated).  Avoid alcoholic beverages for 24 hours or as instructed.  MEDICATIONS You may resume your normal medications unless your doctor tells you otherwise.  WHAT YOU CAN EXPECT TODAY Some feelings of bloating in the abdomen.  Passage of more gas than usual.  Spotting of blood in your stool or on the toilet paper.  IF YOU HAD POLYPS REMOVED DURING THE COLONOSCOPY: No aspirin products for 7 days or as instructed.  No alcohol for 7 days or as instructed.  Eat a soft diet for the next 24 hours.  FINDING OUT THE RESULTS OF YOUR TEST Not all test results are available during your visit. If your test results are not back during the visit, make an appointment with your caregiver to find out the  results. Do not assume everything is normal if you have not heard from your caregiver or the medical facility. It is important for you to follow up on all of your test results.  SEEK IMMEDIATE MEDICAL ATTENTION IF: You have more than a spotting of blood in your stool.  Your belly is swollen (abdominal distention).  You are nauseated or vomiting.  You have a temperature over 101.  You have abdominal pain or discomfort that is severe or gets worse throughout the day.       10 polyps removed from your colon today.    Further recommendations to follow pending review of pathology report   resume Eliquis 8/18   at patient request, I called Travis Booker at [phone]  -    discussed findings and recommendations.

## 2021-09-07 NOTE — Anesthesia Preprocedure Evaluation (Signed)
Anesthesia Evaluation  Patient identified by MRN, date of birth, ID band Patient awake    Reviewed: Allergy & Precautions, NPO status , Patient's Chart, lab work & pertinent test results  Airway Mallampati: II  TM Distance: >3 FB Neck ROM: Full   Comment: Cervical disc disease Dental  (+) Dental Advisory Given, Chipped   Pulmonary pneumonia, COPD,  COPD inhaler, former smoker,    Pulmonary exam normal breath sounds clear to auscultation       Cardiovascular Exercise Tolerance: Good hypertension, Pt. on medications + dysrhythmias Atrial Fibrillation  Rhythm:Regular Rate:Bradycardia     Neuro/Psych negative neurological ROS  negative psych ROS   GI/Hepatic Neg liver ROS, GERD  Medicated,  Endo/Other  negative endocrine ROS  Renal/GU negative Renal ROS  negative genitourinary   Musculoskeletal  (+) Arthritis ,   Abdominal   Peds negative pediatric ROS (+)  Hematology negative hematology ROS (+)   Anesthesia Other Findings   Reproductive/Obstetrics negative OB ROS                             Anesthesia Physical Anesthesia Plan  ASA: 3  Anesthesia Plan: General   Post-op Pain Management: Minimal or no pain anticipated   Induction: Intravenous  PONV Risk Score and Plan: Propofol infusion  Airway Management Planned: Nasal Cannula and Natural Airway  Additional Equipment:   Intra-op Plan:   Post-operative Plan:   Informed Consent: I have reviewed the patients History and Physical, chart, labs and discussed the procedure including the risks, benefits and alternatives for the proposed anesthesia with the patient or authorized representative who has indicated his/her understanding and acceptance.     Dental advisory given  Plan Discussed with: CRNA and Surgeon  Anesthesia Plan Comments:         Anesthesia Quick Evaluation

## 2021-09-07 NOTE — Transfer of Care (Signed)
Immediate Anesthesia Transfer of Care Note  Patient: Travis Booker  Procedure(s) Performed: COLONOSCOPY WITH PROPOFOL POLYPECTOMY  Patient Location: PACU  Anesthesia Type:General  Level of Consciousness: awake, alert  and oriented  Airway & Oxygen Therapy: Patient Spontanous Breathing and Patient connected to nasal cannula oxygen  Post-op Assessment: Report given to RN and Post -op Vital signs reviewed and stable  Post vital signs: Reviewed and stable  Last Vitals:  Vitals Value Taken Time  BP 134/56 09/07/21 1528  Temp 97.8 09/07/21 1527  Pulse 55 09/07/21 1528  Resp 13 09/07/21 1528  SpO2 100 % 09/07/21 1528  Vitals shown include unvalidated device data.  Last Pain:  Vitals:   09/07/21 1229  TempSrc: Oral      Patients Stated Pain Goal: 6 (84/03/97 9536)  Complications: No notable events documented.

## 2021-09-09 LAB — SURGICAL PATHOLOGY

## 2021-09-11 ENCOUNTER — Encounter: Payer: Self-pay | Admitting: Internal Medicine

## 2021-09-11 NOTE — Op Note (Signed)
[facility] Patient Name: Travis Booker Procedure Date: 09/07/2021 12:40 PM MRN: [id number] Date of Birth: 1950-08-01 Attending MD: Norvel Richards , MD CSN: [id number] Age: 71 Admit Type: Outpatient Procedure:                Colonoscopy Indications:              Screening for colorectal malignant neoplasm Providers:                Norvel Richards, MD, Janeece Riggers, RN, Dereck Leep, Technician, Ladoris Gene Technician,                            Technician Referring MD:              Medicines:                Propofol per Anesthesia Complications:            No immediate complications. Estimated Blood Loss:     Estimated blood loss: Minimal Procedure:                Pre-Anesthesia Assessment:                           - Prior to the procedure, a History and Physical                            was performed, and patient medications and                            allergies were reviewed. The patient's tolerance of                            previous anesthesia was also reviewed. The risks                            and benefits of the procedure and the sedation                            options and risks were discussed with the patient.                            All questions were answered, and informed consent                            was obtained. Prior Anticoagulants: The patient                            last took Eliquis (apixaban) 3 days prior to the                            procedure. ASA Grade Assessment: III - A patient  with severe systemic disease. After reviewing the                            risks and benefits, the patient was deemed in                            satisfactory condition to undergo the procedure.                           After obtaining informed consent, the colonoscope                            was passed under direct vision. Throughout the                             procedure, the patient's blood pressure, pulse, and                            oxygen saturations were monitored continuously. The                            [phone]) scope was introduced through the                            anus and advanced to the the cecum, identified by                            appendiceal orifice and ileocecal valve. The                            colonoscopy was performed without difficulty. The                            patient tolerated the procedure well. The quality                            of the bowel preparation was adequate. Scope In: 2:44:19 PM Scope Out: 3:21:15 PM Scope Withdrawal Time: 0 hours 29 minutes 36 seconds  Total Procedure Duration: 0 hours 36 minutes 56 seconds  Findings:      The perianal and digital rectal examinations were normal.      Nine semi-pedunculated polyps were found in the rectum, distal rectum,       ascending colon and cecum. The polyps were 5 to 9 mm in size. These       polyps were removed with a cold snare. Resection and retrieval were       complete. Estimated blood loss was minimal.      A 14 mm polyp was found in the ascending colon. The polyp was       semi-pedunculated. The polyp was removed with a hot snare. Resection and       retrieval were complete. Estimated blood loss was minimal.      The exam was otherwise without abnormality on direct and retroflexion       views. Impression:               -  Nine 5 to 9 mm polyps in the rectum, in the                            distal rectum, in the ascending colon and in the                            cecum, removed with a cold snare. Resected and                            retrieved.                           - One 14 mm polyp in the ascending colon, removed                            with a hot snare. Resected and retrieved.                           - The examination was otherwise normal on direct                            and retroflexion views. Moderate  Sedation:      Moderate (conscious) sedation was personally administered by an       anesthesia professional. The following parameters were monitored: oxygen       saturation, heart rate, blood pressure, respiratory rate, EKG, adequacy       of pulmonary ventilation, and response to care. Recommendation:           - Patient has a contact number available for                            emergencies. The signs and symptoms of potential                            delayed complications were discussed with the                            patient. Return to normal activities tomorrow.                            Written discharge instructions were provided to the                            patient.                           - Advance diet as tolerated.                           - Continue present medications.                           - Repeat colonoscopy date to be determined after  pending pathology results are reviewed for                            surveillance.                           - Return to GI office (date not yet determined). Procedure Code(s):        --- Professional ---                           [phone], Colonoscopy, flexible; with removal of                            tumor(s), polyp(s), or other lesion(s) by snare                            technique Diagnosis Code(s):        --- Professional ---                           Z12.11, Encounter for screening for malignant                            neoplasm of colon                           K62.1, Rectal polyp                           K63.5, Polyp of colon CPT copyright 2019 [employer]. All rights reserved. The codes documented in this report are preliminary and upon coder review may  be revised to meet current compliance requirements. [name]. [name], MD Norvel Richards, MD 09/11/2021 7:10:39 PM This report has been signed electronically. Number of Addenda: 0

## 2021-09-12 ENCOUNTER — Encounter (HOSPITAL_COMMUNITY): Payer: Self-pay | Admitting: Internal Medicine

## 2021-09-12 ENCOUNTER — Telehealth: Payer: Self-pay | Admitting: Cardiology

## 2021-09-12 NOTE — Telephone Encounter (Signed)
Pt's wife is requesting a call back to discuss missed appt's and tests. They are requesting advice as to how to proceed with scheduling the missed appt from where the pt possibly having covid. Pt's wife is hoping for a call back today.

## 2021-10-22 DIAGNOSIS — I10 Essential (primary) hypertension: Secondary | ICD-10-CM | POA: Diagnosis not present

## 2021-10-22 DIAGNOSIS — K219 Gastro-esophageal reflux disease without esophagitis: Secondary | ICD-10-CM | POA: Diagnosis not present

## 2021-11-14 DIAGNOSIS — Z23 Encounter for immunization: Secondary | ICD-10-CM | POA: Diagnosis not present

## 2022-01-02 ENCOUNTER — Telehealth: Payer: Self-pay | Admitting: Cardiology

## 2022-01-02 NOTE — Telephone Encounter (Signed)
Checking percert on the following patient for testing scheduled at [facility].    LEXISCAN - 01/25/2022

## 2022-01-04 ENCOUNTER — Other Ambulatory Visit: Payer: Self-pay | Admitting: Cardiology

## 2022-01-19 ENCOUNTER — Ambulatory Visit: Payer: Medicare Other | Attending: Cardiology

## 2022-01-19 DIAGNOSIS — Z136 Encounter for screening for cardiovascular disorders: Secondary | ICD-10-CM

## 2022-01-19 DIAGNOSIS — Z87891 Personal history of nicotine dependence: Secondary | ICD-10-CM | POA: Diagnosis not present

## 2022-01-25 ENCOUNTER — Encounter (HOSPITAL_COMMUNITY)
Admission: RE | Admit: 2022-01-25 | Discharge: 2022-01-25 | Disposition: A | Discharge status: Home / Self Care | Payer: Medicare Other | Source: Ambulatory Visit | Attending: Cardiology | Admitting: Cardiology

## 2022-01-25 ENCOUNTER — Ambulatory Visit (HOSPITAL_COMMUNITY)
Admission: RE | Admit: 2022-01-25 | Discharge: 2022-01-25 | Disposition: A | Discharge status: Home / Self Care | Payer: Medicare Other | Source: Ambulatory Visit | Attending: Cardiology | Admitting: Cardiology

## 2022-01-25 DIAGNOSIS — R079 Chest pain, unspecified: Secondary | ICD-10-CM | POA: Insufficient documentation

## 2022-01-25 LAB — NM MYOCAR MULTI W/SPECT W/WALL MOTION / EF
LV dias vol: 146 mL (ref 62–150)
LV sys vol: 50 mL
Nuc Stress EF: 66 %
Peak HR: 68 {beats}/min
RATE: 0.4
Rest HR: 53 {beats}/min
Rest Nuclear Isotope Dose: 9.2 mCi
SDS: 7
SRS: 2
SSS: 9
ST Depression (mm): 0 mm
Stress Nuclear Isotope Dose: 32 mCi
TID: 1.24

## 2022-01-25 MED ORDER — SODIUM CHLORIDE FLUSH 0.9 % IV SOLN
INTRAVENOUS | Status: AC
  Administered 2022-01-25: 10 mL
  Filled 2022-01-25: qty 10

## 2022-01-25 MED ORDER — TECHNETIUM TC 99M TETROFOSMIN IV KIT
10.0000 | PACK | Freq: Once | INTRAVENOUS | Status: AC | PRN
Start: 2022-01-25 — End: 2022-01-25
  Administered 2022-01-25: 9.15 via INTRAVENOUS

## 2022-01-25 MED ORDER — REGADENOSON 0.4 MG/5ML IV SOLN
INTRAVENOUS | Status: AC
  Administered 2022-01-25: 0.4 mg
  Filled 2022-01-25: qty 5

## 2022-01-25 MED ORDER — TECHNETIUM TC 99M TETROFOSMIN IV KIT
30.0000 | PACK | Freq: Once | INTRAVENOUS | Status: AC | PRN
  Administered 2022-01-25: 32 via INTRAVENOUS

## 2022-01-27 ENCOUNTER — Telehealth: Payer: Self-pay | Admitting: Cardiology

## 2022-01-27 NOTE — Telephone Encounter (Signed)
Pt calling to see if he can have a copy of his stress test results printed out for him. Pt was also confused about something about the bottom of his heart and would like more clarification on it.

## 2022-01-30 ENCOUNTER — Telehealth: Payer: Self-pay | Admitting: *Deleted

## 2022-01-30 NOTE — Telephone Encounter (Signed)
-----   Message from Arnoldo Lenis, MD sent at 01/27/2022  8:59 AM EST ----- Carolee Rota [place], no evidence of any aortic aneurysm  Zandra Abts MD

## 2022-01-30 NOTE — Telephone Encounter (Signed)
  Laurine Blazer, LPN 07/30/3752 23:70 AM EST Back to Top    Notified, copy to pcp.

## 2022-01-30 NOTE — Telephone Encounter (Signed)
Stress test shows mild abnormal area on the bottom of the heart, overall the vast majority of the heart looks fine and the findings are considered to be of low risk. Will discuss at our appt coming up end of the month   Zandra Abts MD  Reviewed stress test results above & informed patient that Dr. Harl Bowie can address any questions he may have at his f/u on 02/22/2022.  He verbalized understanding.

## 2022-02-22 ENCOUNTER — Ambulatory Visit: Payer: Medicare Other | Admitting: Cardiology

## 2022-02-22 NOTE — Progress Notes (Deleted)
Clinical Summary Travis Booker is a 72 y.o.male seen today for follow up of the following medical problems.      1. Afib - on flecanide and dilt,eliquis -previously noted to be in aflutter at recent pcp visit. PCP added metoprolol   - no recent symptoms - complinat with meds       2. HTN  - compliant with meds     3. Chest pain - sharp pain, left sided 5/10 in severity. Can occur at rest or with activity. No other associated symptoms. At times better with better with position. Lasts just a few minutes at a time. Occurs 2-3 times per week - highest activity is housework, no exertional symptoms.      CAD risk factors; HTN, smoker x 57 years, aortic atherosclerosis      Jan 2024 nuclear stress: Moderate sized, mild intensity, apical to basal inferior defect that is partially reversible. Adjacent radiotracer activity in the gut reduces specificity overall, however combination of inferior scar and mild peri-infarct ischemia to be considered.     AAA screening: 12/2021 AAA [place] no aneurysm     Travis Booker also a patient of mine     Past Medical History:  Diagnosis Date   Anxiety    Cervical disc disease    surgery planned for 05/25/10 (Dr. Hal Neer)   COPD (chronic obstructive pulmonary disease) ([place])    Depression with anxiety    DJD (degenerative joint disease)    GERD (gastroesophageal reflux disease)    HTN (hypertension)    Paroxysmal atrial fibrillation (HCC)    a. flecainide therapy;  b. echo 6/09: EF normal, mild LVH;   c. Myoview 7/09: EF 59%, no ischemia     No Known Allergies   Current Outpatient Medications  Medication Sig Dispense Refill   albuterol (VENTOLIN HFA) 108 (90 Base) MCG/ACT inhaler Inhale 1-2 puffs into the lungs every 6 (six) hours as needed for wheezing or shortness of breath.     apixaban (ELIQUIS) 5 MG TABS tablet Take 1 tablet (5 mg total) by mouth 2 (two) times daily. 28 tablet 0   chlorthalidone (HYGROTON) 25 MG tablet TAKE  1/2 TABLET BY MOUTH ONCE DAILY. 45 tablet 2   diltiazem (CARDIZEM CD) 240 MG 24 hr capsule Take 1 capsule (240 mg total) by mouth daily. 30 capsule 0   escitalopram (LEXAPRO) 10 MG tablet Take 10 mg by mouth daily.     flecainide (TAMBOCOR) 100 MG tablet Take 1 tablet (100 mg total) by mouth 2 (two) times daily. 90 tablet 2   metoprolol tartrate (LOPRESSOR) 25 MG tablet Take 0.5 tablets (12.5 mg total) by mouth 2 (two) times daily.     Multiple Vitamins-Minerals (WOMENS MULTIVITAMIN) TABS Take 1 tablet by mouth every other day.     omeprazole (PRILOSEC) 40 MG capsule Take 40 mg by mouth daily.     oxyCODONE-acetaminophen (PERCOCET) 10-325 MG tablet Take 1 tablet by mouth every 4 (four) hours as needed for moderate pain. pain     polyethylene glycol-electrolytes (NULYTELY) 420 g solution As directed (Patient not taking: Reported on 08/18/2021) 4000 mL 0   potassium chloride SA (KLOR-CON M) 20 MEQ tablet TAKE 2 TABLETS DAILY. 180 tablet 1   rosuvastatin (CRESTOR) 20 MG tablet Take 20 mg by mouth at bedtime.     No current facility-administered medications for this visit.     Past Surgical History:  Procedure Laterality Date   APPENDECTOMY  CHOLECYSTECTOMY     COLONOSCOPY  10/07/2003   normal rectum, submucosal nodule in mid-descending colon c/w lipoma   COLONOSCOPY  03/22/2011   Procedure: COLONOSCOPY;  Surgeon: Daneil Dolin, MD;  [facility];  Service: Endoscopy;  Laterality: N/A;  2:30   COLONOSCOPY WITH PROPOFOL N/A 09/07/2021   Procedure: COLONOSCOPY WITH PROPOFOL;  Surgeon: Daneil Dolin, MD;  [facility];  Service: Endoscopy;  Laterality: N/A;  2:00pm   fused C4, C5, C6     anterior approach   lumbar back surgery     POLYPECTOMY  09/07/2021   Procedure: POLYPECTOMY;  Surgeon: Daneil Dolin, MD;  [facility];  Service: Endoscopy;;   TOTAL SHOULDER ARTHROPLASTY Left    WRIST SURGERY Left      No Known Allergies    Family History   Problem Relation Age of Onset   Cancer Mother    Dementia Mother    Hypertension Father    Colon polyps Sister    Heart attack Brother 63   Colon polyps Brother    Colon cancer Neg Hx      Social History Travis Booker reports that he quit smoking about 6 months ago. His smoking use included cigarettes. He has a 28.50 pack-year smoking history. He has never used smokeless tobacco. Travis Booker reports that he does not currently use alcohol.   Review of Systems CONSTITUTIONAL: No weight loss, fever, chills, weakness or fatigue.  HEENT: Eyes: No visual loss, blurred vision, double vision or yellow sclerae.No hearing loss, sneezing, congestion, runny nose or sore throat.  SKIN: No rash or itching.  CARDIOVASCULAR:  RESPIRATORY: No shortness of breath, cough or sputum.  GASTROINTESTINAL: No anorexia, nausea, vomiting or diarrhea. No abdominal pain or blood.  GENITOURINARY: No burning on urination, no polyuria NEUROLOGICAL: No headache, dizziness, syncope, paralysis, ataxia, numbness or tingling in the extremities. No change in bowel or bladder control.  MUSCULOSKELETAL: No muscle, back pain, joint pain or stiffness.  LYMPHATICS: No enlarged nodes. No history of splenectomy.  PSYCHIATRIC: No history of depression or anxiety.  ENDOCRINOLOGIC: No reports of sweating, cold or heat intolerance. No polyuria or polydipsia.  Marland Kitchen   Physical Examination There were no vitals filed for this visit. There were no vitals filed for this visit.  Gen: resting comfortably, no acute distress HEENT: no scleral icterus, pupils equal round and reactive, no palptable cervical adenopathy,  CV Resp: Clear to auscultation bilaterally GI: abdomen is soft, non-tender, non-distended, normal bowel sounds, no hepatosplenomegaly MSK: extremities are warm, no edema.  Skin: warm, no rash Neuro:  no focal deficits Psych: appropriate affect   Diagnostic Studies  Jan 2024 nuclear stress  No ST deviation was noted.  The ECG was negative for ischemia.   LV perfusion is abnormal.  Moderate sized, mild intensity, apical to basal inferior defect that is partially reversible.  Adjacent radiotracer activity in the gut reduces specificity overall, however combination of inferior scar and mild peri-infarct ischemia to be considered.   Left ventricular function is normal. Nuclear stress EF: 66 %.   Abnormal but overall low risk study.  Cannot exclude inferior scar with mild peri-infarct ischemia, however with reduced specificity in the setting of adjacent radiotracer activity in the gut.  LVEF normal at 66% without definite wall motion abnormality.   Assessment and Plan   1. Afib  -doing well on current meds, continue current meds including eliquis for stroke prevention   2. Chest pain - unclear etiology,  multiple CAD risk factors plan for lexiscan to further evaluate - EKG shows SR, no ischemic changes   3. HTN -at goal, continue current meds     4.AAA screen - male over 46 with tobacco history, order AAA screening US     Arnoldo Lenis, M.D., F.A.C.C.

## 2022-03-03 ENCOUNTER — Other Ambulatory Visit: Payer: Self-pay | Admitting: Cardiology

## 2022-03-06 ENCOUNTER — Encounter: Payer: Self-pay | Admitting: Nurse Practitioner

## 2022-03-06 ENCOUNTER — Ambulatory Visit: Payer: Medicare Other | Attending: Cardiology | Admitting: Nurse Practitioner

## 2022-03-06 VITALS — BP 138/72 | HR 52 | Ht 72.0 in | Wt 171.0 lb

## 2022-03-06 DIAGNOSIS — R0789 Other chest pain: Secondary | ICD-10-CM | POA: Diagnosis not present

## 2022-03-06 DIAGNOSIS — I700 Atherosclerosis of aorta: Secondary | ICD-10-CM

## 2022-03-06 DIAGNOSIS — J449 Chronic obstructive pulmonary disease, unspecified: Secondary | ICD-10-CM | POA: Diagnosis not present

## 2022-03-06 DIAGNOSIS — I480 Paroxysmal atrial fibrillation: Secondary | ICD-10-CM

## 2022-03-06 DIAGNOSIS — I10 Essential (primary) hypertension: Secondary | ICD-10-CM

## 2022-03-06 DIAGNOSIS — R5383 Other fatigue: Secondary | ICD-10-CM

## 2022-03-06 DIAGNOSIS — R0609 Other forms of dyspnea: Secondary | ICD-10-CM | POA: Diagnosis not present

## 2022-03-06 MED ORDER — FAMOTIDINE 20 MG PO TABS: 20.0000 mg | ORAL_TABLET | Freq: Every day | ORAL | 6 refills | Status: DC

## 2022-03-06 NOTE — Patient Instructions (Addendum)
Medication Instructions:  Begin Pepcid 64m daily  Continue all other medications.     Labwork: none  Testing/Procedures: Your physician has requested that you have an echocardiogram. Echocardiography is a painless test that uses sound waves to create images of your heart. It provides your doctor with information about the size and shape of your heart and how well your heart's chambers and valves are working. This procedure takes approximately one hour. There are no restrictions for this procedure. Please do NOT wear cologne, perfume, aftershave, or lotions (deodorant is allowed). Please arrive 15 minutes prior to your appointment time.  Office will contact with results via phone, letter or mychart.     Follow-Up: 2-3 months    Any Other Special Instructions Will Be Listed Below (If Applicable).   If you need a refill on your cardiac medications before your next appointment, please call your pharmacy.

## 2022-03-06 NOTE — Progress Notes (Unsigned)
Cardiology Office Note:    Date:  03/06/2022  ID:  Travis Booker, DOB 1950-04-11, MRN [id number]  PCP:  Asencion Noble, [place] Providers Cardiologist:  Carlyle Dolly, MD     Referring MD: Asencion Noble, MD   CC: Here for 6 month follow-up  History of Present Illness:    Travis Booker is a 72 y.o. male with a hx of the following:  PAF Hx of A-flutter HTN Hx of chest pain Aortic atherosclerosis Former smoker GERD COPD  Pt is a very pleasant 72 y.o. male with PMH as mentioned above.   Last seen by Dr. Carlyle Dolly on August 18, 2021.  Patient noted chest pain about 2-3 times per week, not associated typically with exertion, described as sharp and left-sided, only lasted a few minutes at a time.  Was compliant with his medications.  Lexi scan and AAA duplex were arranged.  No other medication changes were made.   AAA duplex was normal, no evidence of aortic aneurysm was identified.  Lexi scan performed in January 2024 was negative for ischemia, LV perfusion was abnormal, moderate size/mild intensity, apical to basal inferior defect was partially reversible, combination of inferior scar and mild peri-infarct ischemia to be considered, nuclear stress EF 66%.  Today he presents for follow-up.  Does admit to innermittent chest pain, described in the middle of his chest, and noticed this with sitting, attributes this to gas.  Only notices shortness of breath with extreme exertion.  Admits to low energy for the past year. He is no longer drinking alcohol. Denies any palpitations, syncope, presyncope, dizziness, orthopnea, PND, swelling or significant weight changes, acute bleeding, or claudication.  Past Medical History:  Diagnosis Date   Anxiety    Cervical disc disease    surgery planned for 05/25/10 (Dr. Hal Neer)   COPD (chronic obstructive pulmonary disease) ([place])    Depression with anxiety    DJD (degenerative joint disease)    GERD  (gastroesophageal reflux disease)    HTN (hypertension)    Paroxysmal atrial fibrillation (HCC)    a. flecainide therapy;  b. echo 6/09: EF normal, mild LVH;   c. Myoview 7/09: EF 59%, no ischemia    Past Surgical History:  Procedure Laterality Date   APPENDECTOMY     CHOLECYSTECTOMY     COLONOSCOPY  10/07/2003   normal rectum, submucosal nodule in mid-descending colon c/w lipoma   COLONOSCOPY  03/22/2011   Procedure: COLONOSCOPY;  Surgeon: Daneil Dolin, MD;  [facility];  Service: Endoscopy;  Laterality: N/A;  2:30   COLONOSCOPY WITH PROPOFOL N/A 09/07/2021   Procedure: COLONOSCOPY WITH PROPOFOL;  Surgeon: Daneil Dolin, MD;  [facility];  Service: Endoscopy;  Laterality: N/A;  2:00pm   fused C4, C5, C6     anterior approach   lumbar back surgery     POLYPECTOMY  09/07/2021   Procedure: POLYPECTOMY;  Surgeon: Daneil Dolin, MD;  [facility];  Service: Endoscopy;;   TOTAL SHOULDER ARTHROPLASTY Left    WRIST SURGERY Left     Current Medications: Current Meds  Medication Sig   albuterol (VENTOLIN HFA) 108 (90 Base) MCG/ACT inhaler Inhale 1-2 puffs into the lungs every 6 (six) hours as needed for wheezing or shortness of breath.   apixaban (ELIQUIS) 5 MG TABS tablet Take 1 tablet (5 mg total) by mouth 2 (two) times daily.   chlorthalidone (HYGROTON) 25 MG tablet TAKE (1/2) TABLET BY MOUTH  ONCE DAILY.   diltiazem (CARDIZEM CD) 240 MG 24 hr capsule Take 1 capsule (240 mg total) by mouth daily.   escitalopram (LEXAPRO) 10 MG tablet Take 10 mg by mouth daily.   famotidine (PEPCID) 20 MG tablet Take 1 tablet (20 mg total) by mouth daily.   flecainide (TAMBOCOR) 100 MG tablet Take 1 tablet (100 mg total) by mouth 2 (two) times daily.   metoprolol tartrate (LOPRESSOR) 25 MG tablet Take 0.5 tablets (12.5 mg total) by mouth 2 (two) times daily.   Multiple Vitamins-Minerals (WOMENS MULTIVITAMIN) TABS Take 1 tablet by mouth daily.   omeprazole (PRILOSEC)  40 MG capsule Take 40 mg by mouth daily.   oxyCODONE-acetaminophen (PERCOCET) 10-325 MG tablet Take 1 tablet by mouth every 4 (four) hours as needed for moderate pain. pain   potassium chloride SA (KLOR-CON M) 20 MEQ tablet TAKE 2 TABLETS DAILY.   rosuvastatin (CRESTOR) 20 MG tablet Take 20 mg by mouth at bedtime.     Allergies:   Patient has no known allergies.   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   Drug use: No   Sexual activity: Yes    Partners: Female  Other Topics Concern   Not on file  Social History Narrative   Not on file   Social Determinants of Health   Financial Resource Strain: Not on file  Food Insecurity: Not on file  Transportation Needs: Not on file  Physical Activity: Not on file  Stress: Not on file  Social Connections: Not on file     Family History: The patient's family history includes Cancer in his mother; Colon polyps in his brother and sister; Dementia in his mother; Heart attack (age of onset: 20) in his brother; Hypertension in his father. There is no history of Colon cancer.  ROS:   Please see the history of present illness.     All other systems reviewed and are  negative.  EKGs/Labs/Other Studies Reviewed:    The following studies were reviewed today:   EKG:  EKG is not ordered today.    Lexiscan on 01/25/2022:   No ST deviation was noted. The ECG was negative for ischemia.   LV perfusion is abnormal.  Moderate sized, mild intensity, apical to basal inferior defect that is partially reversible.  Adjacent radiotracer activity in the gut reduces specificity overall, however combination of inferior scar and mild peri-infarct ischemia to be considered.   Left ventricular function is normal. Nuclear stress EF: 66 %.  Abnormal but overall low risk study.  Cannot exclude inferior scar with mild peri-infarct ischemia, however with reduced specificity in the setting of adjacent radiotracer activity in the gut.  LVEF normal at 66% without definite wall motion abnormality.  Recent Labs: 09/05/2021: BUN 22; Creatinine, Ser 0.77; Potassium 3.4; Sodium 138  Recent Lipid Panel    Component Value Date/Time   CHOL  07/13/2007 0520    173        ATP III CLASSIFICATION:  <200     mg/dL   Desirable  200-239  mg/dL   Borderline High  >=240    mg/dL   High   TRIG 261 (H) 07/13/2007 0520   HDL 27 (L) 07/13/2007 0520   CHOLHDL 6.4 07/13/2007 0520   VLDL 52 (H) 07/13/2007 0520   LDLCALC  07/13/2007 0520    94        Total Cholesterol/HDL:CHD Risk Coronary Heart Disease Risk Table                     Men   Women  1/2 Average Risk   3.4   3.3     Risk Assessment/Calculations:    CHA2DS2-VASc Score = 3  This indicates a 3.2% annual risk of stroke. The patient's score is based upon: CHF History: 0 HTN History: 1 Diabetes History: 0 Stroke History: 0 Vascular Disease History: 1 Age Score: 1 Gender Score: 0   Physical Exam:    VS:  BP 138/72   Pulse (!) 52   Ht 6' (1.829 m)   Wt 171 lb (77.6 kg)   SpO2 93%   BMI 23.19 kg/m     Wt Readings from Last 3 Encounters:  03/06/22 171 lb (77.6 kg)  09/05/21 172 lb 12.8 oz (78.4 kg)  08/18/21 172 lb  12.8 oz (78.4 kg)     GEN: Well nourished, well developed in no acute distress HEENT: Normal NECK: No JVD; No carotid bruits CARDIAC: S1/S2, RRR, no murmurs, rubs, gallops; 2+ pulses RESPIRATORY:  Clear to auscultation without rales, wheezing or rhonchi  MUSCULOSKELETAL:  No edema; No deformity  SKIN: Warm and dry NEUROLOGIC:  Alert and oriented x 3 PSYCHIATRIC:  Normal affect   ASSESSMENT:    1. Atypical chest pain   2. PAF (paroxysmal atrial fibrillation) ([place])   3. Essential hypertension, benign   4. Aortic atherosclerosis ([place])   5. Fatigue, unspecified type   6. DOE (dyspnea on exertion)   7. Chronic obstructive pulmonary disease, unspecified COPD type ([place])    PLAN:    In order of problems listed above:  Atypical chest pain Etiology unclear, however pt attributes this to gas and does have hx of GERD. Some of pt's description of CP sounds GI related. Will initiate Pepcid 20 mg daily. Reviewed NST 01/2021 that was low risk. Continue current medication regimen. Heart healthy diet and regular cardiovascular exercise encouraged. ED precautions discussed.   PAF Denies any tachycardia, palpitations, or bleeding issues on Eliquis.  Continue diltiazem, flecainide, and Lopressor.  On appropriate dosage of Eliquis.  Continue Eliquis 5 mg twice daily. Heart healthy diet and regular cardiovascular exercise encouraged.   HTN BP stable.  BP well-controlled at home.  Continue current medication regimen. Heart healthy diet and regular cardiovascular exercise encouraged.   Aortic atherosclerosis Continue rosuvastatin. Heart healthy diet and regular cardiovascular exercise encouraged.   5. Fatigue, DOE, COPD Reports of chronic fatigue for the past year. SHOB with only extreme amount of exertion per his  report, does have hx of COPD.  Will update echocardiogram at this time. Heart healthy diet and regular cardiovascular exercise encouraged.   6. Disposition: Follow-up with me or APP in 2-3  months or sooner if anything changes.   Medication Adjustments/Labs and Tests Ordered: Current medicines are reviewed at length with the patient today.  Concerns regarding medicines are outlined above.  Orders Placed This Encounter  Procedures   ECHOCARDIOGRAM COMPLETE   Meds ordered this encounter  Medications   famotidine (PEPCID) 20 MG tablet    Sig: Take 1 tablet (20 mg total) by mouth daily.    Dispense:  30 tablet    Refill:  6    New 03/06/2022    Patient Instructions  Medication Instructions:  Begin Pepcid 39m daily  Continue all other medications.     Labwork: none  Testing/Procedures: Your physician has requested that you have an echocardiogram. Echocardiography is a painless test that uses sound waves to create images of your heart. It provides your doctor with information about the size and shape of your heart and how well your heart's chambers and valves are working. This procedure takes approximately one hour. There are no restrictions for this procedure. Please do NOT wear cologne, perfume, aftershave, or lotions (deodorant is allowed). Please arrive 15 minutes prior to your appointment time.  Office will contact with results via phone, letter or mychart.     Follow-Up: 2-3 months    Any Other Special Instructions Will Be Listed Below (If Applicable).   If you need a refill on your cardiac medications before your next appointment, please call your pharmacy.    Signed, EFinis Bud NP  03/08/2022 2:44 PM    [place]

## 2022-03-20 DIAGNOSIS — M1990 Unspecified osteoarthritis, unspecified site: Secondary | ICD-10-CM | POA: Diagnosis not present

## 2022-03-20 DIAGNOSIS — M47892 Other spondylosis, cervical region: Secondary | ICD-10-CM | POA: Diagnosis not present

## 2022-03-20 DIAGNOSIS — I700 Atherosclerosis of aorta: Secondary | ICD-10-CM | POA: Diagnosis not present

## 2022-03-20 DIAGNOSIS — I480 Paroxysmal atrial fibrillation: Secondary | ICD-10-CM | POA: Diagnosis not present

## 2022-03-22 ENCOUNTER — Ambulatory Visit: Payer: Medicare Other | Attending: Nurse Practitioner

## 2022-03-22 DIAGNOSIS — R0609 Other forms of dyspnea: Secondary | ICD-10-CM | POA: Diagnosis not present

## 2022-03-22 DIAGNOSIS — R5383 Other fatigue: Secondary | ICD-10-CM | POA: Diagnosis not present

## 2022-03-22 LAB — ECHOCARDIOGRAM COMPLETE
AR max vel: 2.42 cm2
AV Peak grad: 7.1 mmHg
Ao pk vel: 1.34 m/s
Area-P 1/2: 3.23 cm2
Calc EF: 60.1 %
MV M vel: 2.28 m/s
MV Peak grad: 20.8 mmHg
S' Lateral: 2.8 cm
Single Plane A2C EF: 60.9 %
Single Plane A4C EF: 62.1 %

## 2022-04-28 DIAGNOSIS — M1990 Unspecified osteoarthritis, unspecified site: Secondary | ICD-10-CM | POA: Diagnosis not present

## 2022-04-28 DIAGNOSIS — Z79899 Other long term (current) drug therapy: Secondary | ICD-10-CM | POA: Diagnosis not present

## 2022-04-28 DIAGNOSIS — I700 Atherosclerosis of aorta: Secondary | ICD-10-CM | POA: Diagnosis not present

## 2022-05-03 DIAGNOSIS — I480 Paroxysmal atrial fibrillation: Secondary | ICD-10-CM | POA: Diagnosis not present

## 2022-05-03 DIAGNOSIS — E785 Hyperlipidemia, unspecified: Secondary | ICD-10-CM | POA: Diagnosis not present

## 2022-05-03 DIAGNOSIS — I10 Essential (primary) hypertension: Secondary | ICD-10-CM | POA: Diagnosis not present

## 2022-05-04 ENCOUNTER — Encounter: Payer: Self-pay | Admitting: Internal Medicine

## 2022-05-05 ENCOUNTER — Ambulatory Visit: Payer: Medicare Other | Admitting: Nurse Practitioner

## 2022-05-19 ENCOUNTER — Other Ambulatory Visit: Payer: Self-pay | Admitting: Cardiology

## 2022-09-02 ENCOUNTER — Encounter (HOSPITAL_COMMUNITY): Payer: Self-pay

## 2022-09-02 ENCOUNTER — Observation Stay (HOSPITAL_COMMUNITY)
Admission: EM | Admit: 2022-09-02 | Discharge: 2022-09-03 | Disposition: A | Discharge status: Home / Self Care | Payer: Medicare Other | Attending: Student | Admitting: Student

## 2022-09-02 ENCOUNTER — Other Ambulatory Visit: Payer: Self-pay

## 2022-09-02 ENCOUNTER — Emergency Department (HOSPITAL_COMMUNITY): Payer: Medicare Other

## 2022-09-02 DIAGNOSIS — J449 Chronic obstructive pulmonary disease, unspecified: Secondary | ICD-10-CM | POA: Diagnosis not present

## 2022-09-02 DIAGNOSIS — J4489 Other specified chronic obstructive pulmonary disease: Secondary | ICD-10-CM | POA: Diagnosis present

## 2022-09-02 DIAGNOSIS — Z7901 Long term (current) use of anticoagulants: Secondary | ICD-10-CM | POA: Diagnosis not present

## 2022-09-02 DIAGNOSIS — J441 Chronic obstructive pulmonary disease with (acute) exacerbation: Secondary | ICD-10-CM | POA: Diagnosis not present

## 2022-09-02 DIAGNOSIS — M7989 Other specified soft tissue disorders: Secondary | ICD-10-CM

## 2022-09-02 DIAGNOSIS — R059 Cough, unspecified: Secondary | ICD-10-CM | POA: Diagnosis not present

## 2022-09-02 DIAGNOSIS — F17210 Nicotine dependence, cigarettes, uncomplicated: Secondary | ICD-10-CM | POA: Diagnosis not present

## 2022-09-02 DIAGNOSIS — D500 Iron deficiency anemia secondary to blood loss (chronic): Secondary | ICD-10-CM | POA: Diagnosis not present

## 2022-09-02 DIAGNOSIS — I480 Paroxysmal atrial fibrillation: Secondary | ICD-10-CM | POA: Diagnosis not present

## 2022-09-02 DIAGNOSIS — I10 Essential (primary) hypertension: Secondary | ICD-10-CM | POA: Diagnosis present

## 2022-09-02 DIAGNOSIS — I4891 Unspecified atrial fibrillation: Secondary | ICD-10-CM | POA: Diagnosis present

## 2022-09-02 DIAGNOSIS — Z7902 Long term (current) use of antithrombotics/antiplatelets: Secondary | ICD-10-CM | POA: Insufficient documentation

## 2022-09-02 DIAGNOSIS — Z720 Tobacco use: Secondary | ICD-10-CM | POA: Diagnosis present

## 2022-09-02 DIAGNOSIS — E876 Hypokalemia: Secondary | ICD-10-CM

## 2022-09-02 DIAGNOSIS — D649 Anemia, unspecified: Secondary | ICD-10-CM

## 2022-09-02 DIAGNOSIS — U071 COVID-19: Secondary | ICD-10-CM | POA: Diagnosis not present

## 2022-09-02 DIAGNOSIS — K219 Gastro-esophageal reflux disease without esophagitis: Secondary | ICD-10-CM | POA: Diagnosis present

## 2022-09-02 DIAGNOSIS — R2241 Localized swelling, mass and lump, right lower limb: Secondary | ICD-10-CM | POA: Diagnosis not present

## 2022-09-02 DIAGNOSIS — E785 Hyperlipidemia, unspecified: Secondary | ICD-10-CM

## 2022-09-02 DIAGNOSIS — R509 Fever, unspecified: Secondary | ICD-10-CM | POA: Diagnosis not present

## 2022-09-02 LAB — CBC WITH DIFFERENTIAL/PLATELET
Abs Immature Granulocytes: 0.01 10*3/uL (ref 0.00–0.07)
Basophils Absolute: 0 10*3/uL (ref 0.0–0.1)
Basophils Relative: 1 %
Eosinophils Absolute: 0 10*3/uL (ref 0.0–0.5)
Eosinophils Relative: 1 %
HCT: 34.5 % — ABNORMAL LOW (ref 39.0–52.0)
Hemoglobin: 11.3 g/dL — ABNORMAL LOW (ref 13.0–17.0)
Immature Granulocytes: 0 %
Lymphocytes Relative: 27 %
Lymphs Abs: 1.2 10*3/uL (ref 0.7–4.0)
MCH: 30.3 pg (ref 26.0–34.0)
MCHC: 32.8 g/dL (ref 30.0–36.0)
MCV: 92.5 fL (ref 80.0–100.0)
Monocytes Absolute: 0.7 10*3/uL (ref 0.1–1.0)
Monocytes Relative: 16 %
Neutro Abs: 2.4 10*3/uL (ref 1.7–7.7)
Neutrophils Relative %: 55 %
Platelets: 143 10*3/uL — ABNORMAL LOW (ref 150–400)
RBC: 3.73 MIL/uL — ABNORMAL LOW (ref 4.22–5.81)
RDW: 12.6 % (ref 11.5–15.5)
WBC: 4.4 10*3/uL (ref 4.0–10.5)
nRBC: 0 % (ref 0.0–0.2)

## 2022-09-02 LAB — IRON AND TIBC
Iron: 32 ug/dL — ABNORMAL LOW (ref 45–182)
Saturation Ratios: 10 % — ABNORMAL LOW (ref 17.9–39.5)
TIBC: 312 ug/dL (ref 250–450)
UIBC: 280 ug/dL

## 2022-09-02 LAB — COMPREHENSIVE METABOLIC PANEL
ALT: 18 U/L (ref 0–44)
AST: 27 U/L (ref 15–41)
Albumin: 3.6 g/dL (ref 3.5–5.0)
Alkaline Phosphatase: 51 U/L (ref 38–126)
Anion gap: 13 (ref 5–15)
BUN: 15 mg/dL (ref 8–23)
CO2: 23 mmol/L (ref 22–32)
Calcium: 8.5 mg/dL — ABNORMAL LOW (ref 8.9–10.3)
Chloride: 99 mmol/L (ref 98–111)
Creatinine, Ser: 0.91 mg/dL (ref 0.61–1.24)
GFR, Estimated: >60 mL/min (ref >60)
Glucose, Bld: 91 mg/dL (ref 70–99)
Potassium: 3.3 mmol/L — ABNORMAL LOW (ref 3.5–5.1)
Sodium: 135 mmol/L (ref 135–145)
Total Bilirubin: 0.5 mg/dL (ref 0.3–1.2)
Total Protein: 6.5 g/dL (ref 6.5–8.1)

## 2022-09-02 LAB — GLUCOSE, CAPILLARY: Glucose-Capillary: 80 mg/dL (ref 70–99)

## 2022-09-02 LAB — FERRITIN: Ferritin: 44 ng/mL (ref 24–336)

## 2022-09-02 LAB — RESP PANEL BY RT-PCR (RSV, FLU A&B, COVID)  RVPGX2
Influenza A by PCR: NEGATIVE
Influenza B by PCR: NEGATIVE
Resp Syncytial Virus by PCR: NEGATIVE
SARS Coronavirus 2 by RT PCR: POSITIVE — AB

## 2022-09-02 LAB — I-STAT CG4 LACTIC ACID, ED: Lactic Acid, Venous: 0.8 mmol/L (ref 0.5–1.9)

## 2022-09-02 LAB — PROTIME-INR
INR: 1.3 — ABNORMAL HIGH (ref 0.8–1.2)
Prothrombin Time: 16 seconds — ABNORMAL HIGH (ref 11.4–15.2)

## 2022-09-02 LAB — MAGNESIUM: Magnesium: 1.9 mg/dL (ref 1.7–2.4)

## 2022-09-02 LAB — TSH: TSH: 0.18 u[IU]/mL — ABNORMAL LOW (ref 0.350–4.500)

## 2022-09-02 MED ORDER — LACTATED RINGERS IV BOLUS (SEPSIS)
250.0000 mL | Freq: Once | INTRAVENOUS | Status: AC
  Administered 2022-09-02: 250 mL via INTRAVENOUS

## 2022-09-02 MED ORDER — OXYCODONE-ACETAMINOPHEN 5-325 MG PO TABS
2.0000 | ORAL_TABLET | Freq: Four times a day (QID) | ORAL | Status: DC | PRN
  Administered 2022-09-02 – 2022-09-03 (×3): 2 via ORAL
  Filled 2022-09-02 (×4): qty 2

## 2022-09-02 MED ORDER — APIXABAN 5 MG PO TABS
5.0000 mg | ORAL_TABLET | Freq: Two times a day (BID) | ORAL | Status: DC
  Administered 2022-09-02 – 2022-09-03 (×2): 5 mg via ORAL
  Filled 2022-09-02 (×2): qty 1

## 2022-09-02 MED ORDER — ACETAMINOPHEN 325 MG PO TABS
650.0000 mg | ORAL_TABLET | Freq: Once | ORAL | Status: AC
  Administered 2022-09-02: 650 mg via ORAL
  Filled 2022-09-02: qty 2

## 2022-09-02 MED ORDER — SODIUM CHLORIDE 0.9 % IV SOLN
2.0000 g | Freq: Once | INTRAVENOUS | Status: AC
  Administered 2022-09-02: 2 g via INTRAVENOUS
  Filled 2022-09-02: qty 12.5

## 2022-09-02 MED ORDER — METRONIDAZOLE 500 MG/100ML IV SOLN
500.0000 mg | Freq: Once | INTRAVENOUS | Status: AC
  Administered 2022-09-02: 500 mg via INTRAVENOUS
  Filled 2022-09-02: qty 100

## 2022-09-02 MED ORDER — ACETAMINOPHEN 650 MG RE SUPP: 650.0000 mg | Freq: Four times a day (QID) | RECTAL | Status: DC | PRN

## 2022-09-02 MED ORDER — ALBUTEROL SULFATE HFA 108 (90 BASE) MCG/ACT IN AERS
2.0000 | INHALATION_SPRAY | Freq: Once | RESPIRATORY_TRACT | Status: AC
  Administered 2022-09-02: 2 via RESPIRATORY_TRACT
  Filled 2022-09-02: qty 6.7

## 2022-09-02 MED ORDER — FLECAINIDE ACETATE 100 MG PO TABS
100.0000 mg | ORAL_TABLET | Freq: Two times a day (BID) | ORAL | Status: DC
  Administered 2022-09-02 – 2022-09-03 (×2): 100 mg via ORAL
  Filled 2022-09-02 (×3): qty 1

## 2022-09-02 MED ORDER — GUAIFENESIN-DM 100-10 MG/5ML PO SYRP
5.0000 mL | ORAL_SOLUTION | ORAL | Status: DC | PRN
  Administered 2022-09-02 – 2022-09-03 (×2): 5 mL via ORAL
  Filled 2022-09-02 (×2): qty 10

## 2022-09-02 MED ORDER — ALBUTEROL SULFATE (2.5 MG/3ML) 0.083% IN NEBU: 2.5000 mg | INHALATION_SOLUTION | RESPIRATORY_TRACT | Status: DC | PRN

## 2022-09-02 MED ORDER — ACETAMINOPHEN 325 MG PO TABS
650.0000 mg | ORAL_TABLET | Freq: Four times a day (QID) | ORAL | Status: DC | PRN
  Administered 2022-09-02 – 2022-09-03 (×2): 650 mg via ORAL
  Filled 2022-09-02 (×2): qty 2

## 2022-09-02 MED ORDER — LACTATED RINGERS IV SOLN: INTRAVENOUS | Status: DC

## 2022-09-02 MED ORDER — ROSUVASTATIN CALCIUM 20 MG PO TABS
20.0000 mg | ORAL_TABLET | Freq: Every day | ORAL | Status: DC
  Administered 2022-09-02: 20 mg via ORAL
  Filled 2022-09-02: qty 1

## 2022-09-02 MED ORDER — ALBUTEROL SULFATE (2.5 MG/3ML) 0.083% IN NEBU: 5.0000 mg | INHALATION_SOLUTION | Freq: Once | RESPIRATORY_TRACT | Status: DC

## 2022-09-02 MED ORDER — VANCOMYCIN HCL 1500 MG/300ML IV SOLN
1500.0000 mg | Freq: Once | INTRAVENOUS | Status: AC
  Administered 2022-09-02: 1500 mg via INTRAVENOUS
  Filled 2022-09-02: qty 300

## 2022-09-02 MED ORDER — PANTOPRAZOLE SODIUM 40 MG PO TBEC
80.0000 mg | DELAYED_RELEASE_TABLET | Freq: Every day | ORAL | Status: DC
  Administered 2022-09-02 – 2022-09-03 (×2): 80 mg via ORAL
  Filled 2022-09-02 (×2): qty 2

## 2022-09-02 MED ORDER — ESCITALOPRAM OXALATE 10 MG PO TABS
10.0000 mg | ORAL_TABLET | Freq: Every day | ORAL | Status: DC
  Administered 2022-09-02 – 2022-09-03 (×2): 10 mg via ORAL
  Filled 2022-09-02 (×2): qty 1

## 2022-09-02 MED ORDER — ONDANSETRON HCL 4 MG PO TABS: 4.0000 mg | ORAL_TABLET | Freq: Four times a day (QID) | ORAL | Status: DC | PRN

## 2022-09-02 MED ORDER — VANCOMYCIN HCL IN DEXTROSE 1-5 GM/200ML-% IV SOLN: 1000.0000 mg | Freq: Once | INTRAVENOUS | Status: DC

## 2022-09-02 MED ORDER — POTASSIUM CHLORIDE CRYS ER 20 MEQ PO TBCR
20.0000 meq | EXTENDED_RELEASE_TABLET | Freq: Once | ORAL | Status: AC
  Administered 2022-09-02: 20 meq via ORAL
  Filled 2022-09-02: qty 1

## 2022-09-02 MED ORDER — ENSURE ENLIVE PO LIQD
237.0000 mL | Freq: Two times a day (BID) | ORAL | Status: DC
  Administered 2022-09-03: 237 mL via ORAL

## 2022-09-02 MED ORDER — ONDANSETRON HCL 4 MG/2ML IJ SOLN: 4.0000 mg | Freq: Four times a day (QID) | INTRAMUSCULAR | Status: DC | PRN

## 2022-09-02 MED ORDER — IPRATROPIUM-ALBUTEROL 0.5-2.5 (3) MG/3ML IN SOLN
3.0000 mL | Freq: Four times a day (QID) | RESPIRATORY_TRACT | Status: DC
  Administered 2022-09-02: 3 mL via RESPIRATORY_TRACT
  Filled 2022-09-02: qty 3

## 2022-09-02 NOTE — H&P (Signed)
History and Physical    Patient: Travis Booker ZOX:[id number] DOB: [birth year] DOA: 09/02/2022 DOS: the patient was seen and examined on 09/02/2022 PCP: Carylon Perches, MD  Patient coming from: Home - lives with his wife. Uses a cane to ambulate    Chief Complaint: weakness/fatigue  HPI: Travis Booker is a 72 y.o. male with medical history significant of GERD, HLD, COPD, atrial fibrillation on eliquis, depression, HTN who presented to ED with complaints of fatigue/weakness that started about one week ago. He had a fall while at the hospital a few days ago, but states he didn't hit his head and kinda slid to ground. He states he fell due to weakness. He started to have a cough a few days ago and it's productive. He denies any shortness of breath, chest pain/palpitations or fever at home. He states he was warm today. He also has had a headache the past couple of days and a sore throat.   He feels like he has worn himself out as his wife has been in the hospital for 18 days and he has been going back and forth to see her.     Denies any fever/chills, vision changes,  chest pain or palpitations, shortness of breath  abdominal pain, N/V/D, dysuria. His legs are more swollen. His right more than left with some tenderness in his calf. He also has missed a few days of his eliquis.    He smokes 1/2PPD, does drink alcohol.   ER Course:  vitals: temp: 100.7, bp: 125/59,, HR: 61, RR: 23, oxygen: 94%RA Pertinent labs: covid positive, hgb: 11.3, potassium: 3.3,  CXR: no pneumonia In ED: in ED given IVF, broad spectrum abx, COPD, BC obtained and found out he had COVID. [facility] asked to admit.    Review of Systems: As mentioned in the history of present illness. All other systems reviewed and are negative. Past Medical History:  Diagnosis Date   Anxiety    Cervical disc disease    surgery planned for 05/25/10 (Dr. Gerlene Fee)   COPD (chronic obstructive pulmonary disease) (HCC)     Depression with anxiety    DJD (degenerative joint disease)    GERD (gastroesophageal reflux disease)    HTN (hypertension)    Paroxysmal atrial fibrillation (HCC)    a. flecainide therapy;  b. echo 6/09: EF normal, mild LVH;   c. Myoview 7/09: EF 59%, no ischemia   Past Surgical History:  Procedure Laterality Date   APPENDECTOMY     CHOLECYSTECTOMY     COLONOSCOPY  10/07/2003   normal rectum, submucosal nodule in mid-descending colon c/w lipoma   COLONOSCOPY  03/22/2011   Procedure: COLONOSCOPY;  Surgeon: Corbin Ade, MD;  Location: [facility];  Service: Endoscopy;  Laterality: N/A;  2:30   COLONOSCOPY WITH PROPOFOL N/A 09/07/2021   Procedure: COLONOSCOPY WITH PROPOFOL;  Surgeon: Corbin Ade, MD;  Location: [facility];  Service: Endoscopy;  Laterality: N/A;  2:00pm   fused C4, C5, C6     anterior approach   lumbar back surgery     POLYPECTOMY  09/07/2021   Procedure: POLYPECTOMY;  Surgeon: Corbin Ade, MD;  Location: [facility];  Service: Endoscopy;;   TOTAL SHOULDER ARTHROPLASTY Left    WRIST SURGERY Left    Social History:  reports that he has been smoking cigarettes. He started smoking about 58 years ago. He has a 28.5 pack-year smoking history. He has never used smokeless tobacco. He reports that he does not  currently use alcohol. He reports that he does not use drugs.  No Known Allergies  Family History  Problem Relation Age of Onset   Cancer Mother    Dementia Mother    Hypertension Father    Colon polyps Sister    Heart attack Brother 13   Colon polyps Brother    Colon cancer Neg Hx     Prior to Admission medications   Medication Sig Start Date End Date Taking? Authorizing Provider  albuterol (VENTOLIN HFA) 108 (90 Base) MCG/ACT inhaler Inhale 1-2 puffs into the lungs every 6 (six) hours as needed for wheezing or shortness of breath.    [provider]  apixaban (ELIQUIS) 5 MG TABS tablet Take 1 tablet (5 mg total) by mouth 2 (two) times  daily. 03/03/21   Antoine Poche, MD  chlorthalidone (HYGROTON) 25 MG tablet TAKE (1/2) TABLET BY MOUTH ONCE DAILY. 05/19/22   Antoine Poche, MD  diltiazem (CARDIZEM CD) 240 MG 24 hr capsule Take 1 capsule (240 mg total) by mouth daily. 01/12/15   Antoine Poche, MD  escitalopram (LEXAPRO) 10 MG tablet Take 10 mg by mouth daily.    [provider]  famotidine (PEPCID) 20 MG tablet Take 1 tablet (20 mg total) by mouth daily. 03/06/22   Sharlene Dory, NP  flecainide (TAMBOCOR) 100 MG tablet Take 1 tablet (100 mg total) by mouth 2 (two) times daily. 10/16/12   Antoine Poche, MD  metoprolol tartrate (LOPRESSOR) 25 MG tablet Take 0.5 tablets (12.5 mg total) by mouth 2 (two) times daily. 10/07/20   Antoine Poche, MD  Multiple Vitamins-Minerals (WOMENS MULTIVITAMIN) TABS Take 1 tablet by mouth daily.    [provider]  omeprazole (PRILOSEC) 40 MG capsule Take 40 mg by mouth daily. 09/21/20   [provider]  oxyCODONE-acetaminophen (PERCOCET) 10-325 MG tablet Take 1 tablet by mouth every 4 (four) hours as needed for moderate pain. pain    [provider]  potassium chloride SA (KLOR-CON M) 20 MEQ tablet TAKE 2 TABLETS DAILY. 01/04/22   Antoine Poche, MD  rosuvastatin (CRESTOR) 20 MG tablet Take 20 mg by mouth at bedtime. 07/29/21   [provider]    Physical Exam: Vitals:   09/02/22 1130 09/02/22 1200 09/02/22 1215 09/02/22 1224  BP: (!) 115/59 (!) 125/59    Pulse: 63 63 61   Resp: (!) 24 (!) 22 (!) 23   Temp:    (!) 100.7 F (38.2 C)  TempSrc:    Oral  SpO2: 95% 93% 94%   Weight:      Height:       General:  Appears calm and comfortable and is in NAD Eyes:  PERRL, EOMI, normal lids, iris ENT:  grossly normal hearing, lips & tongue, mmm; appropriate dentition Neck:  no LAD, masses or thyromegaly; no carotid bruits Cardiovascular:  RRR, no m/r/g. Trace left LE edema, right 1+ with some TTP in calf/warmth  Respiratory:   CTA  bilaterally with no wheezes/rales/rhonchi.  Normal respiratory effort. Abdomen:  soft, NT, ND, NABS Back:   normal alignment, no CVAT Skin:  no rash or induration seen on limited exam Musculoskeletal:  grossly normal tone BUE/BLE, good ROM, no bony abnormality Lower extremity:   Limited foot exam with no ulcerations.  2+ distal pulses. Psychiatric:  grossly normal mood and affect, speech fluent and appropriate, AOx3 Neurologic:  CN 2-12 grossly intact, moves all extremities in coordinated fashion, sensation intact   Radiological Exams  on Admission: Independently reviewed - see discussion in A/P where applicable  DG Chest Port 1 View  Result Date: 09/02/2022 CLINICAL DATA:  Fever and cough EXAM: PORTABLE CHEST 1 VIEW COMPARISON:  None Available. FINDINGS: Normal mediastinum and cardiac silhouette. Normal pulmonary vasculature. No evidence of effusion, infiltrate, or pneumothorax. No acute bony abnormality. IMPRESSION: No evidence of pneumonia. Electronically Signed   By: Genevive Bi M.D.   On: 09/02/2022 11:27    EKG: Independently reviewed.  NSR with rate 69; nonspecific ST changes with no evidence of acute ischemia   Labs on Admission: I have personally reviewed the available labs and imaging studies at the time of the admission.  Pertinent labs:   covid positive,  hgb: 11.3,  potassium: 3.3,   Assessment and Plan: Principal Problem:   COVID-19 Active Problems:   Right leg swelling   Hypokalemia   Normocytic anemia   COPD   ATRIAL FIBRILLATION   Essential hypertension, benign   Hyperlipidemia   GERD   Tobacco abuse    Assessment and Plan: * COVID-34 72 year old presenting with week long history of fatigue, weakness and cough found to have Covid 19 -obs to tele -no pneumonia on CXR  -initially met SIRS criteria with fever, but resolved  -airborne and contact precautions/PPE -supportive therapy since >5 days of symptoms  -oxygen >94% on RA, no indication for  steroids  -will schedule duonebs with COPD and SABA prn  -more concerned with weakness and reported fall. Will have PT to see him. I think this is multifactorial as his wife is in hospital x 18 days and he has been driving back and forth and is so tired +now covid.  -gentle IVF  -IS to bedside -anti-tussive medication   Right leg swelling Bilateral leg swelling, but R>L with some TTP in calf Has missed a few days of his eliquis Check DVT study   Hypokalemia Check magnesium Replete and trend   Normocytic anemia Check iron studies, no recent records   COPD Per EDP was wheezing on arrival, but improved with albuterol No wheezing on exam, moving air well  Schedule duonebs x 2 and SABA prn  He is on no other maintenance inhaler.   ATRIAL FIBRILLATION NSR, rate controlled Continue flecainide, eliquis  Hold cardizem/metoprolol with soft bp   Essential hypertension, benign Bp normal to soft and he wonders if it has been running low at home, has not checked Will hold meds for now, metoprolol added this year by pcp Check orthostatics   Hyperlipidemia Continue crestor 20mg  daily   GERD Continue PPI daily   Tobacco abuse Smoking 1/2 PPD Declines nicotine patch and states he can quit without It    Advance Care Planning:   Code Status: Full Code   Consults: PT  DVT Prophylaxis: eliquis   Family Communication: none   Severity of Illness: The appropriate patient status for this patient is OBSERVATION. Observation status is judged to be reasonable and necessary in order to provide the required intensity of service to ensure the patient's safety. The patient's presenting symptoms, physical exam findings, and initial radiographic and laboratory data in the context of their medical condition is felt to place them at decreased risk for further clinical deterioration. Furthermore, it is anticipated that the patient will be medically stable for discharge from the hospital within 2  midnights of admission.   Author: Orland Mustard, MD 09/02/2022 1:48 PM  For on call review [email or web address].

## 2022-09-02 NOTE — Progress Notes (Signed)
[facility] night cross cover note:   I was notified by RN that this patient, who was admitted earlier today for COVID-19 infection, is continuing to refuse telemetry monitoring and is also continuing to refuse IV fluids, specifically his existing order for lactated Ringer's running at 100 cc/h.  He notes some anxiety in the setting of his wife being concomitantly hospitalized.  His home Lexapro has been resumed.  The patient also notes chronic pain syndrome for which she is on Percocet 10/325 mg p.o. every 6 hours as needed as an outpatient, and is requesting resumption of this home pain medication.  He is noted to be awake and alert, with most recent blood pressure 124/62, respiratory rate 16-19, and oxygen saturation currently 100% on room air.    I subsequently resumed his outpatient prn Percocet, as above.    Newton Pigg, DO Hospitalist

## 2022-09-02 NOTE — Assessment & Plan Note (Addendum)
Bp normal to soft and he wonders if it has been running low at home, has not checked Will hold meds for now, metoprolol added this year by pcp Check orthostatics

## 2022-09-02 NOTE — Assessment & Plan Note (Signed)
Continue crestor 20mg daily  

## 2022-09-02 NOTE — Assessment & Plan Note (Signed)
NSR, rate controlled Continue flecainide, eliquis  Hold cardizem/metoprolol with soft bp

## 2022-09-02 NOTE — Assessment & Plan Note (Signed)
Continue PPI daily. 

## 2022-09-02 NOTE — ED Triage Notes (Signed)
Pt arrived POV from home c/o generalized body aches for a couple days and a cough for several weeks. Pt states he has also had a headache that won't go away.

## 2022-09-02 NOTE — Progress Notes (Signed)
ED Pharmacy Antibiotic Sign Off An antibiotic consult was received from an ED provider for sepsis per pharmacy dosing for vancomycin and cefepime. A chart review was completed to assess appropriateness.   The following one time order(s) were placed:  Vancomycin 1500 mg IV x 1 Cefepime 2g IV x 1  Further antibiotic and/or antibiotic pharmacy consults should be ordered by the admitting provider if indicated.   Thank you for allowing pharmacy to be a part of this patient's care.   Daylene Posey, [place]  Clinical Pharmacist 09/02/22 10:01 AM

## 2022-09-02 NOTE — ED Notes (Signed)
RN placed urinal at bedside, axox4

## 2022-09-02 NOTE — ED Provider Notes (Signed)
[place] AT [facility] Provider Note   CSN: [id number] Arrival date & time: 09/02/22  4098     History  Chief Complaint  Patient presents with   Generalized Body Aches   Cough    Travis Booker is a 72 y.o. male.  HPI 72 year old male history of COPD, A-fib RVR, on Eliquis, presents today complaining of chills and cough.  Patient states he has not felt well for several weeks.  He has been staying with his wife in the hospital who has throat cancer.  He has become more dyspneic and has been cough having productive cough and having ongoing chills over the past 2 days.  He also has a headache.  He denies sore throat, nausea, or vomiting.     Home Medications Prior to Admission medications   Medication Sig Start Date End Date Taking? Authorizing Provider  albuterol (VENTOLIN HFA) 108 (90 Base) MCG/ACT inhaler Inhale 1-2 puffs into the lungs every 6 (six) hours as needed for wheezing or shortness of breath.    [provider]  apixaban (ELIQUIS) 5 MG TABS tablet Take 1 tablet (5 mg total) by mouth 2 (two) times daily. 03/03/21   Antoine Poche, MD  chlorthalidone (HYGROTON) 25 MG tablet TAKE (1/2) TABLET BY MOUTH ONCE DAILY. 05/19/22   Antoine Poche, MD  diltiazem (CARDIZEM CD) 240 MG 24 hr capsule Take 1 capsule (240 mg total) by mouth daily. 01/12/15   Antoine Poche, MD  escitalopram (LEXAPRO) 10 MG tablet Take 10 mg by mouth daily.    [provider]  famotidine (PEPCID) 20 MG tablet Take 1 tablet (20 mg total) by mouth daily. 03/06/22   Sharlene Dory, NP  flecainide (TAMBOCOR) 100 MG tablet Take 1 tablet (100 mg total) by mouth 2 (two) times daily. 10/16/12   Antoine Poche, MD  metoprolol tartrate (LOPRESSOR) 25 MG tablet Take 0.5 tablets (12.5 mg total) by mouth 2 (two) times daily. 10/07/20   Antoine Poche, MD  Multiple Vitamins-Minerals (WOMENS MULTIVITAMIN) TABS Take 1 tablet by mouth daily.     [provider]  omeprazole (PRILOSEC) 40 MG capsule Take 40 mg by mouth daily. 09/21/20   [provider]  oxyCODONE-acetaminophen (PERCOCET) 10-325 MG tablet Take 1 tablet by mouth every 4 (four) hours as needed for moderate pain. pain    [provider]  potassium chloride SA (KLOR-CON M) 20 MEQ tablet TAKE 2 TABLETS DAILY. 01/04/22   Antoine Poche, MD  rosuvastatin (CRESTOR) 20 MG tablet Take 20 mg by mouth at bedtime. 07/29/21   [provider]      Allergies    Patient has no known allergies.    Review of Systems   Review of Systems  Physical Exam Updated Vital Signs BP (!) 125/59   Pulse 61   Temp (!) 100.7 F (38.2 C) (Oral)   Resp (!) 23   Ht 1.829 m (6')   Wt 74.4 kg   SpO2 94%   BMI 22.24 kg/m  Physical Exam Vitals and nursing note reviewed.  HENT:     Head: Normocephalic.     Right Ear: External ear normal.     Left Ear: External ear normal.     Nose: Nose normal.     Mouth/Throat:     Pharynx: Oropharynx is clear.  Eyes:     Pupils: Pupils are equal, round, and reactive to light.  Cardiovascular:     Rate and Rhythm:  Normal rate and regular rhythm.     Pulses: Normal pulses.  Pulmonary:     Effort: Pulmonary effort is normal.     Breath sounds: Wheezing present.     Comments: Coughing and mild tachypnea Abdominal:     General: Bowel sounds are normal.     Palpations: Abdomen is soft.  Musculoskeletal:        General: Normal range of motion.     Cervical back: Normal range of motion.     Right lower leg: Edema present.     Left lower leg: Edema present.  Skin:    General: Skin is warm and dry.     Capillary Refill: Capillary refill takes less than 2 seconds.  Neurological:     General: No focal deficit present.     Mental Status: He is alert.  Psychiatric:        Mood and Affect: Mood normal.     ED Results / Procedures / Treatments   Labs (all labs ordered are listed, but only abnormal results are  displayed) Labs Reviewed  RESP PANEL BY RT-PCR (RSV, FLU A&B, COVID)  RVPGX2 - Abnormal; Notable for the following components:      Result Value   SARS Coronavirus 2 by RT PCR POSITIVE (*)    All other components within normal limits  CBC WITH DIFFERENTIAL/PLATELET - Abnormal; Notable for the following components:   RBC 3.73 (*)    Hemoglobin 11.3 (*)    HCT 34.5 (*)    Platelets 143 (*)    All other components within normal limits  COMPREHENSIVE METABOLIC PANEL - Abnormal; Notable for the following components:   Potassium 3.3 (*)    Calcium 8.5 (*)    All other components within normal limits  PROTIME-INR - Abnormal; Notable for the following components:   Prothrombin Time 16.0 (*)    INR 1.3 (*)    All other components within normal limits  CULTURE, BLOOD (ROUTINE X 2)  CULTURE, BLOOD (ROUTINE X 2)  URINALYSIS, ROUTINE W REFLEX MICROSCOPIC  I-STAT CG4 LACTIC ACID, ED    EKG EKG Interpretation Date/Time:  Saturday September 02 2022 10:14:57 EDT Ventricular Rate:  69 PR Interval:  58 QRS Duration:  108 QT Interval:  388 QTC Calculation: 416 R Axis:   71  Text Interpretation: Sinus rhythm Short PR interval Anteroseptal infarct, old Confirmed by Margarita Grizzle [phone]) on 09/02/2022 11:18:51 AM  Radiology DG Chest Port 1 View  Result Date: 09/02/2022 CLINICAL DATA:  Fever and cough EXAM: PORTABLE CHEST 1 VIEW COMPARISON:  None Available. FINDINGS: Normal mediastinum and cardiac silhouette. Normal pulmonary vasculature. No evidence of effusion, infiltrate, or pneumothorax. No acute bony abnormality. IMPRESSION: No evidence of pneumonia. Electronically Signed   By: Genevive Bi M.D.   On: 09/02/2022 11:27    Procedures Procedures    Medications Ordered in ED Medications  lactated ringers infusion ( Intravenous New Bag/Given 09/02/22 1115)  ceFEPIme (MAXIPIME) 2 g in sodium chloride 0.9 % 100 mL IVPB (0 g Intravenous Stopped 09/02/22 1101)  metroNIDAZOLE (FLAGYL) IVPB 500 mg  (0 mg Intravenous Stopped 09/02/22 1133)  lactated ringers bolus 250 mL (0 mLs Intravenous Stopped 09/02/22 1115)  acetaminophen (TYLENOL) tablet 650 mg (650 mg Oral Given 09/02/22 1030)  vancomycin (VANCOREADY) IVPB 1500 mg/300 mL (0 mg Intravenous Stopped 09/02/22 1234)  albuterol (VENTOLIN HFA) 108 (90 Base) MCG/ACT inhaler 2 puff (2 puffs Inhalation Given 09/02/22 1224)    ED Course/ Medical Decision Making/ A&P Clinical Course  as of 09/02/22 1250  Sat Sep 02, 2022  1116 COVID panel reviewed interpreted and positive for COVID [DR]  1118 CBC reviewed and interpreted with normal white blood cell count and some mild anemia with hemoglobin decreased to 11 from prior first prior of 13 [DR]    Clinical Course User Index [DR] Margarita Grizzle, MD                                 Medical Decision Making Amount and/or Complexity of Data Reviewed Labs: ordered. Radiology: ordered.  Risk OTC drugs. Prescription drug management.    72 year old male presents today with cough and fever. Evaluated here in the ED with labs, imaging, EKG Patient with known history of COPD Paxlovid contraindicated based on Eliquis send medications COVID test is positive Treated with albuterol for some wheezing Patient with fever here and feels improved after treated with acetaminophen Patient's sats have been stable at 91 to 95% with some tachypnea  Discussed with Dr. Artis Flock who will see for admission       Final Clinical Impression(s) / ED Diagnoses Final diagnoses:  COVID  Chronic obstructive pulmonary disease, unspecified COPD type [facility])    Rx / DC Orders ED Discharge Orders     None         Margarita Grizzle, MD 09/02/22 1250

## 2022-09-02 NOTE — Assessment & Plan Note (Signed)
Check iron studies, no recent records

## 2022-09-02 NOTE — Assessment & Plan Note (Signed)
Bilateral leg swelling, but R>L with some TTP in calf Has missed a few days of his eliquis Check DVT study

## 2022-09-02 NOTE — Assessment & Plan Note (Signed)
Check magnesium Replete and trend  

## 2022-09-02 NOTE — Assessment & Plan Note (Signed)
Per EDP was wheezing on arrival, but improved with albuterol No wheezing on exam, moving air well  Schedule duonebs x 2 and SABA prn  He is on no other maintenance inhaler.

## 2022-09-02 NOTE — Assessment & Plan Note (Signed)
72 year old presenting with week long history of fatigue, weakness and cough found to have Covid 19 -obs to tele -no pneumonia on CXR  -initially met SIRS criteria with fever, but resolved  -airborne and contact precautions/PPE -supportive therapy since >5 days of symptoms  -oxygen >94% on RA, no indication for steroids  -will schedule duonebs with COPD and SABA prn  -more concerned with weakness and reported fall. Will have PT to see him. I think this is multifactorial as his wife is in hospital x 18 days and he has been driving back and forth and is so tired +now covid.  -gentle IVF  -IS to bedside -anti-tussive medication

## 2022-09-02 NOTE — Assessment & Plan Note (Signed)
Smoking 1/2 PPD Declines nicotine patch and states he can quit without It

## 2022-09-02 NOTE — ED Notes (Signed)
ED TO INPATIENT HANDOFF REPORT  ED Nurse Name and Phone #: Tori [phone]  S Name/Age/Gender Travis Booker 72 y.o. male Room/Bed: 036C/036C  Code Status   Code Status: Prior  Home/SNF/Other Home Patient oriented to: self, place, time, and situation Is this baseline? Yes   Triage Complete: Triage complete  Chief Complaint COVID-19 [U07.1]  Triage Note Pt arrived POV from home c/o generalized body aches for a couple days and a cough for several weeks. Pt states he has also had a headache that won't go away.    Allergies No Known Allergies  Level of Care/Admitting Diagnosis ED Disposition     ED Disposition  Admit   Condition  --   Comment  Hospital Area: [facility] [[id number]]  Level of Care: Telemetry Medical [[id number]]  May place patient in observation at [facility] or [facility] if equivalent level of care is available:: Yes  Covid Evaluation: Confirmed COVID Positive  Diagnosis: COVID-19 [[id number]]  Admitting Physician: Orland Mustard [[id number]]  Attending Physician: Orland Mustard [[id number]]          B Medical/Surgery History Past Medical History:  Diagnosis Date   Anxiety    Cervical disc disease    surgery planned for 05/25/10 (Dr. Gerlene Fee)   COPD (chronic obstructive pulmonary disease) (HCC)    Depression with anxiety    DJD (degenerative joint disease)    GERD (gastroesophageal reflux disease)    HTN (hypertension)    Paroxysmal atrial fibrillation (HCC)    a. flecainide therapy;  b. echo 6/09: EF normal, mild LVH;   c. Myoview 7/09: EF 59%, no ischemia   Past Surgical History:  Procedure Laterality Date   APPENDECTOMY     CHOLECYSTECTOMY     COLONOSCOPY  10/07/2003   normal rectum, submucosal nodule in mid-descending colon c/w lipoma   COLONOSCOPY  03/22/2011   Procedure: COLONOSCOPY;  Surgeon: Corbin Ade, MD;  [facility];  Service: Endoscopy;  Laterality: N/A;  2:30   COLONOSCOPY WITH PROPOFOL N/A  09/07/2021   Procedure: COLONOSCOPY WITH PROPOFOL;  Surgeon: Corbin Ade, MD;  Location: [facility];  Service: Endoscopy;  Laterality: N/A;  2:00pm   fused C4, C5, C6     anterior approach   lumbar back surgery     POLYPECTOMY  09/07/2021   Procedure: POLYPECTOMY;  Surgeon: Corbin Ade, MD;  Location: [facility];  Service: Endoscopy;;   TOTAL SHOULDER ARTHROPLASTY Left    WRIST SURGERY Left      A IV Location/Drains/Wounds Patient Lines/Drains/Airways Status     Active Line/Drains/Airways     Name Placement date Placement time Site Days   Peripheral IV 09/02/22 20 G Anterior;Right Forearm 09/02/22  1000  Forearm  less than 1   Peripheral IV 09/02/22 20 G Anterior;Proximal Forearm 09/02/22  1000  Forearm  less than 1            Intake/Output Last 24 hours No intake or output data in the 24 hours ending 09/02/22 1317  Labs/Imaging Results for orders placed or performed during the hospital encounter of 09/02/22 (from the past 48 hour(s))  Resp panel by RT-PCR (RSV, Flu A&B, Covid) Peripheral     Status: Abnormal   Collection Time: 09/02/22  9:52 AM   Specimen: Peripheral; Nasal Swab  Result Value Ref Range   SARS Coronavirus 2 by RT PCR POSITIVE (A) NEGATIVE   Influenza A by PCR NEGATIVE NEGATIVE   Influenza B by PCR NEGATIVE  NEGATIVE    Comment: (NOTE) The Xpert Xpress SARS-CoV-2/FLU/RSV plus assay is intended as an aid in the diagnosis of influenza from Nasopharyngeal swab specimens and should not be used as a sole basis for treatment. Nasal washings and aspirates are unacceptable for Xpert Xpress SARS-CoV-2/FLU/RSV testing.  Fact Sheet for Patients: BloggerCourse.com  Fact Sheet for Healthcare Providers: SeriousBroker.it  This test is not yet approved or cleared by the [place] FDA and has been authorized for detection and/or diagnosis of SARS-CoV-2 by FDA under an Emergency Use Authorization  (EUA). This EUA will remain in effect (meaning this test can be used) for the duration of the COVID-19 declaration under Section 564(b)(1) of the Act, 21 U.S.C. section 360bbb-3(b)(1), unless the authorization is terminated or revoked.     Resp Syncytial Virus by PCR NEGATIVE NEGATIVE    Comment: (NOTE) Fact Sheet for Patients: BloggerCourse.com  Fact Sheet for Healthcare Providers: SeriousBroker.it  This test is not yet approved or cleared by the [place] FDA and has been authorized for detection and/or diagnosis of SARS-CoV-2 by FDA under an Emergency Use Authorization (EUA). This EUA will remain in effect (meaning this test can be used) for the duration of the COVID-19 declaration under Section 564(b)(1) of the Act, 21 U.S.C. section 360bbb-3(b)(1), unless the authorization is terminated or revoked.  Performed at [facility], [address] [place], [place] [address]   CBC with Differential     Status: Abnormal   Collection Time: 09/02/22 10:06 AM  Result Value Ref Range   WBC 4.4 4.0 - 10.5 K/uL   RBC 3.73 (L) 4.22 - 5.81 MIL/uL   Hemoglobin 11.3 (L) 13.0 - 17.0 g/dL   HCT 60.4 (L) 54.0 - 98.1 %   MCV 92.5 80.0 - 100.0 fL   MCH 30.3 26.0 - 34.0 pg   MCHC 32.8 30.0 - 36.0 g/dL   RDW 19.1 47.8 - 29.5 %   Platelets 143 (L) 150 - 400 K/uL   nRBC 0.0 0.0 - 0.2 %   Neutrophils Relative % 55 %   Neutro Abs 2.4 1.7 - 7.7 K/uL   Lymphocytes Relative 27 %   Lymphs Abs 1.2 0.7 - 4.0 K/uL   Monocytes Relative 16 %   Monocytes Absolute 0.7 0.1 - 1.0 K/uL   Eosinophils Relative 1 %   Eosinophils Absolute 0.0 0.0 - 0.5 K/uL   Basophils Relative 1 %   Basophils Absolute 0.0 0.0 - 0.1 K/uL   Immature Granulocytes 0 %   Abs Immature Granulocytes 0.01 0.00 - 0.07 K/uL    Comment: Performed at [facility], [address], [place], [place] [address]  Comprehensive metabolic panel     Status: Abnormal   Collection  Time: 09/02/22 10:06 AM  Result Value Ref Range   Sodium 135 135 - 145 mmol/L   Potassium 3.3 (L) 3.5 - 5.1 mmol/L   Chloride 99 98 - 111 mmol/L   CO2 23 22 - 32 mmol/L   Glucose, Bld 91 70 - 99 mg/dL    Comment: Glucose reference range applies only to samples taken after fasting for at least 8 hours.   BUN 15 8 - 23 mg/dL   Creatinine, Ser 8.65 0.61 - 1.24 mg/dL   Calcium 8.5 (L) 8.9 - 10.3 mg/dL   Total Protein 6.5 6.5 - 8.1 g/dL   Albumin 3.6 3.5 - 5.0 g/dL   AST 27 15 - 41 U/L   ALT 18 0 - 44 U/L  Alkaline Phosphatase 51 38 - 126 U/L   Total Bilirubin 0.5 0.3 - 1.2 mg/dL   GFR, Estimated >60 >10 mL/min    Comment: (NOTE) Calculated using the CKD-EPI Creatinine Equation (2021)    Anion gap 13 5 - 15    Comment: Performed at [facility], [address], [place], [place] [address]  Protime-INR     Status: Abnormal   Collection Time: 09/02/22 10:06 AM  Result Value Ref Range   Prothrombin Time 16.0 (H) 11.4 - 15.2 seconds   INR 1.3 (H) 0.8 - 1.2    Comment: (NOTE) INR goal varies based on device and disease states. Performed at [facility], [address], [place], [place] [address]   I-Stat CG4 Lactic Acid     Status: None   Collection Time: 09/02/22 10:12 AM  Result Value Ref Range   Lactic Acid, Venous 0.8 0.5 - 1.9 mmol/L   DG Chest Port 1 View  Result Date: 09/02/2022 CLINICAL DATA:  Fever and cough EXAM: PORTABLE CHEST 1 VIEW COMPARISON:  None Available. FINDINGS: Normal mediastinum and cardiac silhouette. Normal pulmonary vasculature. No evidence of effusion, infiltrate, or pneumothorax. No acute bony abnormality. IMPRESSION: No evidence of pneumonia. Electronically Signed   By: Genevive Bi M.D.   On: 09/02/2022 11:27    Pending Labs Unresulted Labs (From admission, onward)     Start     Ordered   09/02/22 1256  Ferritin  Once,   R        09/02/22 1255   09/02/22 1256  Iron and TIBC  Once,   R        09/02/22 1255   09/02/22 1255  Magnesium   Once,   R        09/02/22 1254   09/02/22 0953  Blood culture (routine x 2)  BLOOD CULTURE X 2,   R (with STAT occurrences)      09/02/22 0953   09/02/22 0953  Urinalysis, Routine w reflex microscopic -Urine, Clean Catch  Once,   URGENT       Question:  Specimen Source  Answer:  Urine, Clean Catch   09/02/22 0953            Vitals/Pain Today's Vitals   09/02/22 1130 09/02/22 1200 09/02/22 1215 09/02/22 1224  BP: (!) 115/59 (!) 125/59    Pulse: 63 63 61   Resp: (!) 24 (!) 22 (!) 23   Temp:    (!) 100.7 F (38.2 C)  TempSrc:    Oral  SpO2: 95% 93% 94%   Weight:      Height:      PainSc:        Isolation Precautions No active isolations  Medications Medications  lactated ringers infusion ( Intravenous New Bag/Given 09/02/22 1115)  potassium chloride SA (KLOR-CON M) CR tablet 20 mEq (has no administration in time range)  ceFEPIme (MAXIPIME) 2 g in sodium chloride 0.9 % 100 mL IVPB (0 g Intravenous Stopped 09/02/22 1101)  metroNIDAZOLE (FLAGYL) IVPB 500 mg (0 mg Intravenous Stopped 09/02/22 1133)  lactated ringers bolus 250 mL (0 mLs Intravenous Stopped 09/02/22 1115)  acetaminophen (TYLENOL) tablet 650 mg (650 mg Oral Given 09/02/22 1030)  vancomycin (VANCOREADY) IVPB 1500 mg/300 mL (0 mg Intravenous Stopped 09/02/22 1234)  albuterol (VENTOLIN HFA) 108 (90 Base) MCG/ACT inhaler 2 puff (2 puffs Inhalation Given 09/02/22 1224)    Mobility walks     Focused Assessments Weakness    R Recommendations:  See Admitting Provider Note  Report given to:   Additional Notes: axox4, room air

## 2022-09-03 ENCOUNTER — Observation Stay (HOSPITAL_BASED_OUTPATIENT_CLINIC_OR_DEPARTMENT_OTHER): Payer: Medicare Other

## 2022-09-03 DIAGNOSIS — M7989 Other specified soft tissue disorders: Secondary | ICD-10-CM | POA: Diagnosis not present

## 2022-09-03 DIAGNOSIS — U071 COVID-19: Secondary | ICD-10-CM | POA: Diagnosis not present

## 2022-09-03 DIAGNOSIS — I10 Essential (primary) hypertension: Secondary | ICD-10-CM

## 2022-09-03 DIAGNOSIS — K219 Gastro-esophageal reflux disease without esophagitis: Secondary | ICD-10-CM | POA: Diagnosis not present

## 2022-09-03 DIAGNOSIS — J4489 Other specified chronic obstructive pulmonary disease: Secondary | ICD-10-CM | POA: Diagnosis not present

## 2022-09-03 DIAGNOSIS — E876 Hypokalemia: Secondary | ICD-10-CM

## 2022-09-03 DIAGNOSIS — Z720 Tobacco use: Secondary | ICD-10-CM

## 2022-09-03 DIAGNOSIS — I4891 Unspecified atrial fibrillation: Secondary | ICD-10-CM | POA: Diagnosis not present

## 2022-09-03 LAB — CBC
HCT: 30.4 % — ABNORMAL LOW (ref 39.0–52.0)
Hemoglobin: 10.2 g/dL — ABNORMAL LOW (ref 13.0–17.0)
MCH: 30.4 pg (ref 26.0–34.0)
MCHC: 33.6 g/dL (ref 30.0–36.0)
MCV: 90.7 fL (ref 80.0–100.0)
Platelets: 118 10*3/uL — ABNORMAL LOW (ref 150–400)
RBC: 3.35 MIL/uL — ABNORMAL LOW (ref 4.22–5.81)
RDW: 12.7 % (ref 11.5–15.5)
WBC: 3.4 10*3/uL — ABNORMAL LOW (ref 4.0–10.5)
nRBC: 0 % (ref 0.0–0.2)

## 2022-09-03 LAB — T4, FREE: Free T4: 0.92 ng/dL (ref 0.61–1.12)

## 2022-09-03 LAB — PROCALCITONIN: Procalcitonin: <0.1 ng/mL

## 2022-09-03 LAB — COMPREHENSIVE METABOLIC PANEL WITH GFR
ALT: 16 U/L (ref 0–44)
AST: 26 U/L (ref 15–41)
Albumin: 3 g/dL — ABNORMAL LOW (ref 3.5–5.0)
Alkaline Phosphatase: 43 U/L (ref 38–126)
Anion gap: 10 (ref 5–15)
BUN: 15 mg/dL (ref 8–23)
CO2: 24 mmol/L (ref 22–32)
Calcium: 8 mg/dL — ABNORMAL LOW (ref 8.9–10.3)
Chloride: 98 mmol/L (ref 98–111)
Creatinine, Ser: 0.88 mg/dL (ref 0.61–1.24)
GFR, Estimated: >60 mL/min (ref >60)
Glucose, Bld: 97 mg/dL (ref 70–99)
Potassium: 3.4 mmol/L — ABNORMAL LOW (ref 3.5–5.1)
Sodium: 132 mmol/L — ABNORMAL LOW (ref 135–145)
Total Bilirubin: 0.6 mg/dL (ref 0.3–1.2)
Total Protein: 5.4 g/dL — ABNORMAL LOW (ref 6.5–8.1)

## 2022-09-03 LAB — MAGNESIUM: Magnesium: 1.8 mg/dL (ref 1.7–2.4)

## 2022-09-03 MED ORDER — ACETAMINOPHEN 325 MG PO TABS: 650.0000 mg | ORAL_TABLET | Freq: Four times a day (QID) | ORAL | Status: AC | PRN

## 2022-09-03 MED ORDER — POTASSIUM CHLORIDE CRYS ER 20 MEQ PO TBCR
40.0000 meq | EXTENDED_RELEASE_TABLET | Freq: Once | ORAL | Status: AC
  Administered 2022-09-03: 40 meq via ORAL
  Filled 2022-09-03: qty 2

## 2022-09-03 MED ORDER — DILTIAZEM HCL ER COATED BEADS 120 MG PO CP24: 120.0000 mg | ORAL_CAPSULE | Freq: Every day | ORAL | 1 refills | Status: AC

## 2022-09-03 MED ORDER — GUAIFENESIN ER 600 MG PO TB12: 600.0000 mg | ORAL_TABLET | Freq: Two times a day (BID) | ORAL | Status: AC

## 2022-09-03 MED ORDER — DILTIAZEM HCL ER COATED BEADS 120 MG PO CP24
120.0000 mg | ORAL_CAPSULE | Freq: Every day | ORAL | Status: DC
  Administered 2022-09-03: 120 mg via ORAL
  Filled 2022-09-03: qty 1

## 2022-09-03 NOTE — Plan of Care (Signed)
Pt alert and oriented x 4. Agitation noted at beginning of shift related to telemetry and IV. Reported from previous shift that pt had removed tele and refused to let them reapply. Tele called questioning order. RN this Clinical research associate went in to speak with pt. Pt refused to have tele reapplied. Pt IV from right AC found on floor. Pt stated that he did not want IV fluids. He requested that his home percocet be restarted due to he was having severe pain. pt informed his anxiety is caused by wife being in the hospital and he isn't able to be with her. Pt states he takes lexapro for anxiety. Msg provider Dr. Arlean Hopping during overnight and informed him of pt requests and refusal. Tele d/c. IV fluids documented that pt refused. Percocet ordered and given and was effective. Pt continent of bowel and bladder. Urinal placed at bedside to decrease risk for falls or injury. Pt agreed. Fall mat placed at bedside. Vitals stable. RN provided pt with water pitcher and encouraged pt to drink fluids since he was refusing IV fluids. Pt agreed. All took all Hs meds and Bsx 4. Last Bm 8/9. Heart rate regular. BLE edema noted.  Problem: Education: Goal: Knowledge of risk factors and measures for prevention of condition will improve Outcome: Progressing   Problem: Coping: Goal: Psychosocial and spiritual needs will be supported Outcome: Progressing   Problem: Respiratory: Goal: Will maintain a patent airway Outcome: Progressing Goal: Complications related to the disease process, condition or treatment will be avoided or minimized Outcome: Progressing   Problem: Education: Goal: Knowledge of General Education information will improve Description: Including pain rating scale, medication(s)/side effects and non-pharmacologic comfort measures Outcome: Progressing   Problem: Health Behavior/Discharge Planning: Goal: Ability to manage health-related needs will improve Outcome: Progressing   Problem: Clinical Measurements: Goal:  Ability to maintain clinical measurements within normal limits will improve Outcome: Progressing Goal: Will remain free from infection Outcome: Progressing Goal: Diagnostic test results will improve Outcome: Progressing Goal: Respiratory complications will improve Outcome: Progressing Goal: Cardiovascular complication will be avoided Outcome: Progressing   Problem: Activity: Goal: Risk for activity intolerance will decrease Outcome: Progressing   Problem: Nutrition: Goal: Adequate nutrition will be maintained Outcome: Progressing   Problem: Coping: Goal: Level of anxiety will decrease Outcome: Progressing   Problem: Elimination: Goal: Will not experience complications related to bowel motility Outcome: Progressing Goal: Will not experience complications related to urinary retention Outcome: Progressing   Problem: Pain Managment: Goal: General experience of comfort will improve Outcome: Progressing   Problem: Safety: Goal: Ability to remain free from injury will improve Outcome: Progressing   Problem: Skin Integrity: Goal: Risk for impaired skin integrity will decrease Outcome: Progressing

## 2022-09-03 NOTE — Plan of Care (Signed)
Problem: Education: Goal: Knowledge of General Education information will improve Description: Including pain rating scale, medication(s)/side effects and non-pharmacologic comfort measures Outcome: Progressing Pt understands he was admitted into the hospital for s/p fall, weakness, cough and fever with headaches.  He was found and diagnoses with Covid per MD's orders.  He is on Special Airborne contact precautions per MD's orders.    Problem: Clinical Measurements: Goal: Ability to maintain clinical measurements within normal limits will improve Outcome: Progressing Pt's VS WNL except for his heart rate of 56.  He did t-max at 100.4 F at 0529 on 09/03/2022.MD is aware.   Problem: Clinical Measurements: Goal: Will remain free from infection Outcome: Progressing S/Sx of infection monitored and assessed q8 hours.  He did t-max at 100.4 F at 0529 on 09/03/2022.MD is aware.  Pt was found to have and diagnoses with Covid per MD's orders.  He is on Special Airborne contact precautions per MD's orders.    Problem: Clinical Measurements: Goal: Respiratory complications will improve Outcome: Progressing Respiratory status monitored and assessed q8 hours.  Pt is on room air with PO2 saturations at 95% and respirations of 19 breaths per minute.  She has not endorse c/o SOB and DOE     Problem: Activity: Goal: Risk for activity intolerance will decrease Outcome: Progressing Pt is independent of all is ADLs.  He was observed OOB ambulating in his room with a steady gait..     Problem: Nutrition: Goal: Adequate nutrition will be maintained Outcome: Progressing Pt is on a heart healthy diet per MD's orders.  He has been able to tolerate is diet without s/sx of n/v or abdominal pain/ distention.   Problem: Elimination: Goal: Will not experience complications related to bowel motility Outcome: Progressing Pt LBM was in 09/03/2022.  He has not endorse c/o constipation.    Problem:  Elimination: Goal: Will not experience complications related to urinary retention Outcome: Progressing Pt has denied c/o dysuria or abdominal distention/ pain.   Problem: Pain Management: Goal: General experience of comfort will improve Outcome: Progressing Pt has c/o 7-8/10 bilateral back pain describing it as a constant ache.  Reiterated pain scale so he could adequately rate his pain.  Pt stated his pain goal this admission would be 0/10.  Discussed nonpharmacological methods to help reduce s/sx of pain.  Interventions given per pt's request and MD's orders.     Problem: Safety: Goal: Ability to remain free from injury will improve Outcome: Progressing Pt has remained free from falls thus far.  Instructed pt to utilize RN call light for assistance.  Hourly rounds performed.  Bed in lowest position, locked with two upper side rails engaged.  Belongings and call light within reach.    Problem: Skin Integrity: Goal: Risk for impaired skin integrity will decrease Outcome: Progressing Skin integrity monitored and assessed q-shift. Instructed pt to turn q2 hours to prevent further skin impairment.  Tubes and drains assessed for device related pressure sores.  Pt is continent of both bowel and bladder.

## 2022-09-03 NOTE — Discharge Summary (Signed)
Physician Discharge Summary  Travis Booker VQQ:[id number] DOB: 1950-06-13 DOA: 09/02/2022  PCP: Carylon Perches, MD  Admit date: 09/02/2022 Discharge date: 09/03/2022 Admitted From: Home Disposition: Home Recommendations for Outpatient Follow-up:  Follow up with PCP in 1 week Check CMP and CBC in 1 week Please follow up on the following pending results: None  Home Health: Not indicated Equipment/Devices: Not indicated  Discharge Condition: Stable CODE STATUS: Full code  Follow-up Information     Carylon Perches, MD. Schedule an appointment as soon as possible for a visit in 1 week(s).   Specialty: Internal Medicine Contact information: [address] [place] [address] [phone]                 Hospital course 72 year old M with PMH of COPD, A-fib on Eliquis, HTN, HLD, GERD, depression and tobacco use disorder presenting with generalized weakness, cough, RLE swelling, headache and sore throat for about a week.  He was in the hospital with his wife who was hospitalized for throat cancer for the last 18 days.  In ED, febrile to 100.7.  Tested positive for COVID-19.  Chest x-ray without acute finding.  Given symptoms for over a week, was not started on Paxlovid.  Blood culture collected.  Was not started on steroid since he maintained good saturation on room air.  Lower extremity venous Doppler ordered and admitted for overnight observation.  The next day, remained stable without oxygen requirement.  Blood cultures NGTD.  Felt better and ready to go home.  Evaluated by therapy and no need identified.  Advised on appropriate facemask and hand sanitizer to avoid spread of COVID.  Can use Mucinex and albuterol inhaler as needed for cough.  Encouraged to stop smoking cigarettes.  Of note, patient was bradycardic to 50s even with reduced dose of p.o. Cardizem CD but improved to 70s with minimal exertion.  He was not symptomatic from this.  Decreased home Cardizem CD  from 240 mg daily to 120 mg daily.  Discontinued low-dose metoprolol.  Recommended outpatient follow-up with his cardiologist.   See individual problem list below for more.   Problems addressed during this hospitalization Principal Problem:   COVID-19 Active Problems:   Right leg swelling   Hypokalemia   Normocytic anemia   COPD   ATRIAL FIBRILLATION   Essential hypertension, benign   Hyperlipidemia   GERD   Tobacco abuse              Time spent 35 minutes  Vital signs Vitals:   09/03/22 0921 09/03/22 1201 09/03/22 1531 09/03/22 1534  BP: (!) 119/55 104/60 121/68   Pulse: (!) 56 (!) 50 (!) 52 72  Temp: 98.7 F (37.1 C) 98.3 F (36.8 C) 98 F (36.7 C)   Resp: 19 (!) 22 20   Height:      Weight:      SpO2: 95% 98% 98%   TempSrc: Oral Oral Oral   BMI (Calculated):         Discharge exam  GENERAL: He was walking in and out of the bathroom without distress. HEENT: MMM.  Vision and hearing grossly intact.  NECK: Supple.  No apparent JVD.  RESP:  No IWOB.  Fair aeration bilaterally. CVS:  RRR. Heart sounds normal.  ABD/GI/GU: BS+. Abd soft, NTND.  MSK/EXT:  Moves extremities. No apparent deformity. No edema.  SKIN: no apparent skin lesion or wound NEURO: Awake and alert. Oriented appropriately.  No apparent focal neuro deficit. PSYCH: Calm. Normal affect.  Discharge Instructions Discharge Instructions     Diet - low sodium heart healthy   Complete by: As directed    Discharge instructions   Complete by: As directed    It has been a pleasure taking care of you!  You were hospitalized due to generalized weakness, cough, sore throat and headache likely from COVID infection.  Since you have symptoms for over a week, medical treatment is not beneficial.  Your symptoms should improve over the next few days to weeks.  You may use albuterol and Mucinex as needed for cough.  Keep yourself hydrated.  You may take Tylenol as needed for pain and fever.  Follow-up with  your primary care doctor in 1 to 2 weeks or sooner if needed.   COVID is contagious.  Strongly recommend using facemask and proper hand sanitizer for the next 5 to 10 days.  It is important that you quit smoking cigarettes.  You may use nicotine patch to help you quit smoking.  Nicotine patch is available over-the-counter.  You may also discuss other options to help you quit smoking with your primary care doctor. You can also talk to professional counselors at 1-800-QUIT-NOW [phone]) for free smoking cessation counseling.     Take care,   Increase activity slowly   Complete by: As directed       Allergies as of 09/03/2022   No Known Allergies      Medication List     STOP taking these medications    chlorthalidone 25 MG tablet Commonly known as: HYGROTON   metoprolol tartrate 25 MG tablet Commonly known as: LOPRESSOR       TAKE these medications    acetaminophen 325 MG tablet Commonly known as: TYLENOL Take 2 tablets (650 mg total) by mouth every 6 (six) hours as needed for mild pain, fever or headache (or Fever >/= 101).   albuterol 108 (90 Base) MCG/ACT inhaler Commonly known as: VENTOLIN HFA Inhale 1-2 puffs into the lungs every 6 (six) hours as needed for wheezing or shortness of breath.   apixaban 5 MG Tabs tablet Commonly known as: Eliquis Take 1 tablet (5 mg total) by mouth 2 (two) times daily.   diltiazem 120 MG 24 hr capsule Commonly known as: CARDIZEM CD Take 1 capsule (120 mg total) by mouth daily. Start taking on: September 04, 2022 What changed:  medication strength how much to take   escitalopram 10 MG tablet Commonly known as: LEXAPRO Take 10 mg by mouth daily.   flecainide 100 MG tablet Commonly known as: TAMBOCOR Take 1 tablet (100 mg total) by mouth 2 (two) times daily.   guaiFENesin 600 MG 12 hr tablet Commonly known as: Mucinex Take 1 tablet (600 mg total) by mouth 2 (two) times daily for 5 days.   omeprazole 40 MG  capsule Commonly known as: PRILOSEC Take 40 mg by mouth daily.   oxyCODONE-acetaminophen 10-325 MG tablet Commonly known as: PERCOCET Take 1 tablet by mouth every 4 (four) hours as needed for moderate pain. pain   rosuvastatin 20 MG tablet Commonly known as: CRESTOR Take 20 mg by mouth at bedtime.        Consultations: None  Procedures/Studies:   VAS [place] LOWER EXTREMITY VENOUS (DVT)  Result Date: 09/03/2022  Lower Venous DVT Study Patient Name:  CHEIKH RONNINGEN  Date of Exam:   09/03/2022 Medical Rec #: [id number]                   Accession #:  [id number] Date of Birth: 19-Aug-1950                    Patient Gender: M Patient Age:   72 years Exam Location:  [facility] Procedure:      VAS [place] LOWER EXTREMITY VENOUS (DVT) Referring Phys: Orland Mustard --------------------------------------------------------------------------------  Indications: Covid, SOB, and Edema.  Risk Factors: Atrial fibrillation. Anticoagulation: Eliquis. Comparison Study: Prior negative right LEV done 02/17/09 Performing Technologist: Sherren Kerns RVS  Examination Guidelines: A complete evaluation includes B-mode imaging, spectral Doppler, color Doppler, and power Doppler as needed of all accessible portions of each vessel. Bilateral testing is considered an integral part of a complete examination. Limited examinations for reoccurring indications may be performed as noted. The reflux portion of the exam is performed with the patient in reverse Trendelenburg.  +---------+---------------+---------+-----------+----------+--------------+ RIGHT    CompressibilityPhasicitySpontaneityPropertiesThrombus Aging +---------+---------------+---------+-----------+----------+--------------+ CFV      Full           Yes      Yes                                 +---------+---------------+---------+-----------+----------+--------------+ SFJ      Full                                                         +---------+---------------+---------+-----------+----------+--------------+ FV Prox  Full                                                        +---------+---------------+---------+-----------+----------+--------------+ FV Mid   Full                                                        +---------+---------------+---------+-----------+----------+--------------+ FV DistalFull                                                        +---------+---------------+---------+-----------+----------+--------------+ PFV      Full                                                        +---------+---------------+---------+-----------+----------+--------------+ POP      Full           Yes      Yes                                 +---------+---------------+---------+-----------+----------+--------------+ PTV      Full                                                        +---------+---------------+---------+-----------+----------+--------------+  PERO     Full                                                        +---------+---------------+---------+-----------+----------+--------------+   +---------+---------------+---------+-----------+----------+--------------+ LEFT     CompressibilityPhasicitySpontaneityPropertiesThrombus Aging +---------+---------------+---------+-----------+----------+--------------+ CFV      Full           Yes      Yes                                 +---------+---------------+---------+-----------+----------+--------------+ SFJ      Full                                                        +---------+---------------+---------+-----------+----------+--------------+ FV Prox  Full                                                        +---------+---------------+---------+-----------+----------+--------------+ FV Mid   Full                                                         +---------+---------------+---------+-----------+----------+--------------+ FV DistalFull                                                        +---------+---------------+---------+-----------+----------+--------------+ PFV      Full                                                        +---------+---------------+---------+-----------+----------+--------------+ POP      Full           Yes      Yes                                 +---------+---------------+---------+-----------+----------+--------------+ PTV      Full                                                        +---------+---------------+---------+-----------+----------+--------------+ PERO     Full                                                        +---------+---------------+---------+-----------+----------+--------------+  Summary: BILATERAL: - No evidence of deep vein thrombosis seen in the lower extremities, bilaterally. -No evidence of popliteal cyst, bilaterally.   *See table(s) above for measurements and observations. Electronically signed by Gerarda Fraction on 09/03/2022 at 9:22:25 AM.    Final    DG Chest Port 1 View  Result Date: 09/02/2022 CLINICAL DATA:  Fever and cough EXAM: PORTABLE CHEST 1 VIEW COMPARISON:  None Available. FINDINGS: Normal mediastinum and cardiac silhouette. Normal pulmonary vasculature. No evidence of effusion, infiltrate, or pneumothorax. No acute bony abnormality. IMPRESSION: No evidence of pneumonia. Electronically Signed   By: Genevive Bi M.D.   On: 09/02/2022 11:27       The results of significant diagnostics from this hospitalization (including imaging, microbiology, ancillary and laboratory) are listed below for reference.     Microbiology: Recent Results (from the past 240 hour(s))  Resp panel by RT-PCR (RSV, Flu A&B, Covid) Peripheral     Status: Abnormal   Collection Time: 09/02/22  9:52 AM   Specimen: Peripheral; Nasal Swab  Result Value Ref Range  Status   SARS Coronavirus 2 by RT PCR POSITIVE (A) NEGATIVE Final   Influenza A by PCR NEGATIVE NEGATIVE Final   Influenza B by PCR NEGATIVE NEGATIVE Final    Comment: (NOTE) The Xpert Xpress SARS-CoV-2/FLU/RSV plus assay is intended as an aid in the diagnosis of influenza from Nasopharyngeal swab specimens and should not be used as a sole basis for treatment. Nasal washings and aspirates are unacceptable for Xpert Xpress SARS-CoV-2/FLU/RSV testing.  Fact Sheet for Patients: BloggerCourse.com  Fact Sheet for Healthcare Providers: SeriousBroker.it  This test is not yet approved or cleared by the [place] FDA and has been authorized for detection and/or diagnosis of SARS-CoV-2 by FDA under an Emergency Use Authorization (EUA). This EUA will remain in effect (meaning this test can be used) for the duration of the COVID-19 declaration under Section 564(b)(1) of the Act, 21 U.S.C. section 360bbb-3(b)(1), unless the authorization is terminated or revoked.     Resp Syncytial Virus by PCR NEGATIVE NEGATIVE Final    Comment: (NOTE) Fact Sheet for Patients: BloggerCourse.com  Fact Sheet for Healthcare Providers: SeriousBroker.it  This test is not yet approved or cleared by the [place] FDA and has been authorized for detection and/or diagnosis of SARS-CoV-2 by FDA under an Emergency Use Authorization (EUA). This EUA will remain in effect (meaning this test can be used) for the duration of the COVID-19 declaration under Section 564(b)(1) of the Act, 21 U.S.C. section 360bbb-3(b)(1), unless the authorization is terminated or revoked.  Performed at [facility], [address], [place], [place] [address]   Blood culture (routine x 2)     Status: None (Preliminary result)   Collection Time: 09/02/22  9:53 AM   Specimen: BLOOD RIGHT ARM  Result Value Ref Range Status    Specimen Description BLOOD RIGHT ARM  Final   Special Requests   Final    BOTTLES DRAWN AEROBIC AND ANAEROBIC Blood Culture adequate volume   Culture   Final    NO GROWTH 1 DAY Performed at [facility], [address], [place], [place] [address]    Report Status PENDING  Incomplete  Blood culture (routine x 2)     Status: None (Preliminary result)   Collection Time: 09/02/22  4:07 PM   Specimen: BLOOD  Result Value Ref Range Status   Specimen Description BLOOD LEFT ANTECUBITAL  Final   Special Requests   Final  BOTTLES DRAWN AEROBIC AND ANAEROBIC Blood Culture results may not be optimal due to an inadequate volume of blood received in culture bottles   Culture   Final    NO GROWTH < 24 HOURS Performed at [facility], [address] [place], [place] [address]    Report Status PENDING  Incomplete     Labs:  CBC: Recent Labs  Lab 09/02/22 1006 09/03/22 0313  WBC 4.4 3.4*  NEUTROABS 2.4  --   HGB 11.3* 10.2*  HCT 34.5* 30.4*  MCV 92.5 90.7  PLT 143* 118*   BMP &GFR Recent Labs  Lab 09/02/22 1006 09/02/22 1015 09/03/22 0313  NA 135  --  132*  K 3.3*  --  3.4*  CL 99  --  98  CO2 23  --  24  GLUCOSE 91  --  97  BUN 15  --  15  CREATININE 0.91  --  0.88  CALCIUM 8.5*  --  8.0*  MG  --  1.9 1.8   Estimated Creatinine Clearance: 81 mL/min (by C-G formula based on SCr of 0.88 mg/dL). Liver & Pancreas: Recent Labs  Lab 09/02/22 1006 09/03/22 0313  AST 27 26  ALT 18 16  ALKPHOS 51 43  BILITOT 0.5 0.6  PROT 6.5 5.4*  ALBUMIN 3.6 3.0*   No results for input(s): "LIPASE", "AMYLASE" in the last 168 hours. No results for input(s): "AMMONIA" in the last 168 hours. Diabetic: No results for input(s): "HGBA1C" in the last 72 hours. Recent Labs  Lab 09/02/22 1458  GLUCAP 80   Cardiac Enzymes: No results for input(s): "CKTOTAL", "CKMB", "CKMBINDEX", "TROPONINI" in the last 168 hours. No results for input(s): "PROBNP" in the last 8760  hours. Coagulation Profile: Recent Labs  Lab 09/02/22 1006  INR 1.3*   Thyroid Function Tests: Recent Labs    09/02/22 1607 09/03/22 0313  TSH 0.180*  --   FREET4  --  0.92   Lipid Profile: No results for input(s): "CHOL", "HDL", "LDLCALC", "TRIG", "CHOLHDL", "LDLDIRECT" in the last 72 hours. Anemia Panel: Recent Labs    09/02/22 1015  FERRITIN 44  TIBC 312  IRON 32*   Urine analysis:    Component Value Date/Time   COLORURINE YELLOW 11/14/2011 1150   APPEARANCEUR CLEAR 11/14/2011 1150   LABSPEC 1.010 11/14/2011 1150   PHURINE 6.0 11/14/2011 1150   GLUCOSEU NEGATIVE 11/14/2011 1150   HGBUR SMALL (A) 11/14/2011 1150   BILIRUBINUR NEGATIVE 11/14/2011 1150   KETONESUR NEGATIVE 11/14/2011 1150   PROTEINUR NEGATIVE 11/14/2011 1150   UROBILINOGEN 0.2 11/14/2011 1150   NITRITE NEGATIVE 11/14/2011 1150   LEUKOCYTESUR NEGATIVE 11/14/2011 1150   Sepsis Labs: Invalid input(s): "PROCALCITONIN", "LACTICIDVEN"   SIGNED:  Almon Hercules, MD  [facility] 09/03/2022, 4:16 PM

## 2022-09-03 NOTE — Progress Notes (Signed)
Pt to be discharge home to self care.  Reviewed AVS with pt and informed him of changes to his medication list.  Made pt aware that he had prescriptions to be picked up from his preferred pharmacy.  Answered any pending questions.  Pt had no further questions.  Removed PIV which was CDI and free from s/sx of infection.  Assisted pt in getting dressed and gathering his belongings. Assisted pt to wheel chair, ensured that he had his belongings in hand.  Pt was escorted to the front of the hospital via wheelchair accompanied by Care RN where her ride awaited to take her home.  Pt discharge in stable condition.

## 2022-09-03 NOTE — Progress Notes (Signed)
VASCULAR LAB    Bilateral lower extremity venous duplex has been performed.  See CV proc for preliminary results.   Omkar Stratmann, RVT 09/03/2022, 7:57 AM

## 2022-09-03 NOTE — Evaluation (Signed)
Physical Therapy Evaluation & Discharge Patient Details Name: Travis Booker MRN: [id number] DOB: 1951-01-16 Today's Date: 09/03/2022  History of Present Illness  Pt is a 72 y.o. male admitted 09/02/22 with fatigue, weakness, cough, BLE swelling over the past week; pt also had a fall while visiting wife at hospital. Workup for (+) COVID. No PNA on CXR. Negative BLE DVT. PMH includes HTN, PAF, COPD, DJD, chronic pain syndrome, anxiety, depression.   Clinical Impression  Patient evaluated by Physical Therapy with no further acute PT needs identified. PTA, pt independent, retired from work, lives with wife who is currently admitted to hospital; pt reports significant fatigue and feeling "run down" sleeping at the hospital and visiting with her, as well as general stress of her medical condition. Today, pt independent with mobility, endorses fatigue and has intermittent unproductive cough. Educ re: activity recommendations, therex/ambulation, importance of OOB mobility. All education has been completed and the patient has no further questions. PT is signing off. Thank you for this referral.  Sitting BP 105/53 (70), HR 56, SpO2 97% on RA Standing BP 104/57 (71)        If plan is discharge home, recommend the following:  N/A   Can travel by private vehicle    Yes    Equipment Recommendations None recommended by PT  Recommendations for Other Services       Functional Status Assessment       Precautions / Restrictions Precautions Precautions: Fall Restrictions Weight Bearing Restrictions: No      Mobility  Bed Mobility Overal bed mobility: Independent                  Transfers Overall transfer level: Independent Equipment used: None               General transfer comment: multiple sit<>stands from EOB and recliner indep without DME    Ambulation/Gait Ambulation/Gait assistance: Independent Gait Distance (Feet): 80 Feet Assistive device: None Gait  Pattern/deviations: Step-through pattern, Decreased stride length Gait velocity: Decreased     General Gait Details: slow, guarded gait indep with and without use of walking stick; 1x self-corrected instability; pt denies lightheadedness, endorses fatigue  Stairs            Wheelchair Mobility     Tilt Bed    Modified Rankin (Stroke Patients Only)       Balance Overall balance assessment: Modified Independent Sitting-balance support: No upper extremity supported, Feet supported Sitting balance-Leahy Scale: Good Sitting balance - Comments: indep to don socks sitting EOB   Standing balance support: No upper extremity supported, During functional activity Standing balance-Leahy Scale: Good               High level balance activites: Direction changes, Turns, Sudden stops, Head turns, Side stepping High Level Balance Comments: no LOB observed with higher level balance tasks; guarded mobility with c/o fatigue             Pertinent Vitals/Pain Pain Assessment Pain Assessment: Faces Faces Pain Scale: Hurts a little bit Pain Location: "everywhere" Pain Descriptors / Indicators: Discomfort Pain Intervention(s): Monitored during session    Home Living Family/patient expects to be discharged to:: Private residence Living Arrangements: Spouse/significant other Available Help at Discharge: Family;Available PRN/intermittently Type of Home: House Home Access: Stairs to enter Entrance Stairs-Rails: Right Entrance Stairs-Number of Steps: 3   Home Layout: One level Home Equipment: Other (comment) (walking stick) Additional Comments: lives with wife who is currently admitted to hospital, she has multiple  health issues. reports no other family/friends nearby really    Prior Function Prior Level of Function : Independent/Modified Independent;Driving             Mobility Comments: independent without DME; bought a walking stick a few days ago since he was feeling  weak/off balance. ADLs Comments: independent     Extremity/Trunk Assessment   Upper Extremity Assessment Upper Extremity Assessment: Overall WFL for tasks assessed    Lower Extremity Assessment Lower Extremity Assessment: Overall WFL for tasks assessed       Communication   Communication Communication: No apparent difficulties  Cognition Arousal: Alert Behavior During Therapy: WFL for tasks assessed/performed Overall Cognitive Status: Within Functional Limits for tasks assessed                                          General Comments General comments (skin integrity, edema, etc.): educ re: activity recommendations (walking in room, seated/standing activity, upright sitting with BLEs elevated for swelling), importance of mobility, fall risk reduction, energy conservation strategies. VSS on RA    Exercises Other Exercises Other Exercises: 5x repeated sit<>stands from recliner without UE support   Assessment/Plan    PT Assessment Patient does not need any further PT services  PT Problem List         PT Treatment Interventions      PT Goals (Current goals can be found in the Care Plan section)  Acute Rehab PT Goals PT Goal Formulation: All assessment and education complete, DC therapy    Frequency       Co-evaluation               AM-PAC PT "6 Clicks" Mobility  Outcome Measure Help needed turning from your back to your side while in a flat bed without using bedrails?: None Help needed moving from lying on your back to sitting on the side of a flat bed without using bedrails?: None Help needed moving to and from a bed to a chair (including a wheelchair)?: None Help needed standing up from a chair using your arms (e.g., wheelchair or bedside chair)?: None Help needed to walk in hospital room?: None Help needed climbing 3-5 steps with a railing? : None 6 Click Score: 24    End of Session   Activity Tolerance: Patient tolerated treatment  well;Patient limited by fatigue Patient left: in bed;with call bell/phone within reach Nurse Communication: Mobility status PT Visit Diagnosis: Other abnormalities of gait and mobility (R26.89);Muscle weakness (generalized) (M62.81)    Time: 5409-8119 PT Time Calculation (min) (ACUTE ONLY): 22 min   Charges:   PT Evaluation $PT Eval Moderate Complexity: 1 Mod   PT General Charges $$ ACUTE PT VISIT: 1 Visit       Ina Homes, PT, DPT Acute Rehabilitation Services  Personal: Secure Chat Rehab Office: [phone]  Malachy Chamber 09/03/2022, 10:00 AM

## 2022-09-07 LAB — CULTURE, BLOOD (ROUTINE X 2)
Culture: NO GROWTH
Culture: NO GROWTH
Special Requests: ADEQUATE

## 2022-09-15 DIAGNOSIS — R001 Bradycardia, unspecified: Secondary | ICD-10-CM | POA: Diagnosis not present

## 2022-09-15 DIAGNOSIS — U071 COVID-19: Secondary | ICD-10-CM | POA: Diagnosis not present

## 2022-10-17 ENCOUNTER — Other Ambulatory Visit: Payer: Self-pay | Admitting: Cardiology

## 2022-11-20 DIAGNOSIS — Z23 Encounter for immunization: Secondary | ICD-10-CM | POA: Diagnosis not present

## 2023-01-18 ENCOUNTER — Other Ambulatory Visit: Payer: Self-pay

## 2023-01-18 ENCOUNTER — Encounter (HOSPITAL_COMMUNITY): Payer: Self-pay | Admitting: Emergency Medicine

## 2023-01-18 ENCOUNTER — Emergency Department (HOSPITAL_COMMUNITY): Payer: Medicare Other

## 2023-01-18 ENCOUNTER — Emergency Department (HOSPITAL_COMMUNITY)
Admission: EM | Admit: 2023-01-18 | Discharge: 2023-01-18 | Disposition: A | Discharge status: Home / Self Care | Payer: Medicare Other | Attending: Emergency Medicine | Admitting: Emergency Medicine

## 2023-01-18 DIAGNOSIS — J209 Acute bronchitis, unspecified: Secondary | ICD-10-CM | POA: Insufficient documentation

## 2023-01-18 DIAGNOSIS — Z20822 Contact with and (suspected) exposure to covid-19: Secondary | ICD-10-CM | POA: Insufficient documentation

## 2023-01-18 DIAGNOSIS — R051 Acute cough: Secondary | ICD-10-CM | POA: Diagnosis not present

## 2023-01-18 DIAGNOSIS — Z7901 Long term (current) use of anticoagulants: Secondary | ICD-10-CM | POA: Diagnosis not present

## 2023-01-18 DIAGNOSIS — R0602 Shortness of breath: Secondary | ICD-10-CM | POA: Diagnosis not present

## 2023-01-18 DIAGNOSIS — Z471 Aftercare following joint replacement surgery: Secondary | ICD-10-CM | POA: Diagnosis not present

## 2023-01-18 DIAGNOSIS — J40 Bronchitis, not specified as acute or chronic: Secondary | ICD-10-CM

## 2023-01-18 DIAGNOSIS — Z96612 Presence of left artificial shoulder joint: Secondary | ICD-10-CM | POA: Diagnosis not present

## 2023-01-18 DIAGNOSIS — R059 Cough, unspecified: Secondary | ICD-10-CM | POA: Diagnosis not present

## 2023-01-18 DIAGNOSIS — J069 Acute upper respiratory infection, unspecified: Secondary | ICD-10-CM | POA: Diagnosis not present

## 2023-01-18 LAB — RESP PANEL BY RT-PCR (RSV, FLU A&B, COVID)  RVPGX2
Influenza A by PCR: NEGATIVE
Influenza B by PCR: NEGATIVE
Resp Syncytial Virus by PCR: NEGATIVE
SARS Coronavirus 2 by RT PCR: NEGATIVE

## 2023-01-18 MED ORDER — AZITHROMYCIN 250 MG PO TABS: ORAL_TABLET | ORAL | 0 refills | Status: DC

## 2023-01-18 MED ORDER — BENZONATATE 100 MG PO CAPS: 100.0000 mg | ORAL_CAPSULE | Freq: Three times a day (TID) | ORAL | 0 refills | Status: DC

## 2023-01-18 NOTE — Discharge Instructions (Signed)
It was our pleasure to provide your ER care today - we hope that you feel better.  Drink plenty of fluids/stay well hydrated.  Take zithromax (antibiotic) as prescribed. Take tessalon as prescribed, as need, to help with cough.   Follow up with primary care doctor in two weeks if symptoms fail to improve/resolve.  Return to ER if worse, new symptoms, increased trouble breathing, chest pain, high fevers, weak/fainting, or other emergency concern.

## 2023-01-18 NOTE — ED Provider Notes (Signed)
Travis Booker Provider Note   CSN: [id number] Arrival date & time: 01/18/23  2841     History  Chief Complaint  Patient presents with   Cough    Travis Booker is a 72 y.o. male.  Pt with prod cough, nasal congestion in the past three days (occasional greenish sputum, no hemoptysis). Symptoms acute onset, moderate, persistent. No sore throat. No trouble breathing or swallowing. No specific known ill contacts or known covid/flu exposure. No chest pain. No abd pain or vomiting/diarrhea. No leg pain or swelling. No fever or chills. +smoker.   The history is provided by the patient and medical records.  Cough Associated symptoms: rhinorrhea   Associated symptoms: no chest pain, no chills, no fever, no headaches, no rash, no shortness of breath and no sore throat        Home Medications Prior to Admission medications   Medication Sig Start Date End Date Taking? Authorizing Provider  acetaminophen (TYLENOL) 325 MG tablet Take 2 tablets (650 mg total) by mouth every 6 (six) hours as needed for mild pain, fever or headache (or Fever >/= 101). 09/03/22   Almon Hercules, MD  albuterol (VENTOLIN HFA) 108 (90 Base) MCG/ACT inhaler Inhale 1-2 puffs into the lungs every 6 (six) hours as needed for wheezing or shortness of breath.    [provider]  apixaban (ELIQUIS) 5 MG TABS tablet Take 1 tablet (5 mg total) by mouth 2 (two) times daily. 03/03/21   Antoine Poche, MD  diltiazem (CARDIZEM CD) 120 MG 24 hr capsule Take 1 capsule (120 mg total) by mouth daily. 09/04/22   Almon Hercules, MD  escitalopram (LEXAPRO) 10 MG tablet Take 10 mg by mouth daily.    [provider]  flecainide (TAMBOCOR) 100 MG tablet Take 1 tablet (100 mg total) by mouth 2 (two) times daily. 10/16/12   Antoine Poche, MD  omeprazole (PRILOSEC) 40 MG capsule Take 40 mg by mouth daily. 09/21/20   [provider]  oxyCODONE-acetaminophen  (PERCOCET) 10-325 MG tablet Take 1 tablet by mouth every 4 (four) hours as needed for moderate pain. pain    [provider]  rosuvastatin (CRESTOR) 20 MG tablet Take 20 mg by mouth at bedtime. 07/29/21   [provider]      Allergies    Patient has no known allergies.    Review of Systems   Review of Systems  Constitutional:  Negative for chills and fever.  HENT:  Positive for congestion and rhinorrhea. Negative for sore throat.   Eyes:  Negative for redness.  Respiratory:  Positive for cough. Negative for shortness of breath.   Cardiovascular:  Negative for chest pain and leg swelling.  Gastrointestinal:  Negative for abdominal pain, diarrhea and vomiting.  Genitourinary:  Negative for flank pain.  Musculoskeletal:  Negative for neck pain and neck stiffness.  Skin:  Negative for rash.  Neurological:  Negative for headaches.       Intermittent mild headache. No abrupt/severe head pain.     Physical Exam Updated Vital Signs BP 133/73   Pulse 67   Temp 98.4 F (36.9 C) (Oral)   Resp 20   SpO2 92%  Physical Exam Vitals and nursing note reviewed.  Constitutional:      Appearance: Normal appearance. He is well-developed.  HENT:     Head: Atraumatic.     Right Ear: Tympanic membrane normal.     Left Ear: Tympanic membrane  normal.     Nose: Congestion present.     Mouth/Throat:     Mouth: Mucous membranes are moist.     Pharynx: Oropharynx is clear.  Eyes:     General: No scleral icterus.    Conjunctiva/sclera: Conjunctivae normal.     Pupils: Pupils are equal, round, and reactive to light.  Neck:     Trachea: No tracheal deviation.     Comments: No stiffness or rigidity Cardiovascular:     Rate and Rhythm: Normal rate and regular rhythm.     Pulses: Normal pulses.     Heart sounds: Normal heart sounds. No murmur heard.    No friction rub. No gallop.  Pulmonary:     Effort: Pulmonary effort is normal. No accessory muscle usage or respiratory distress.      Comments: Upper resp congestion Abdominal:     General: There is no distension.     Palpations: Abdomen is soft.     Tenderness: There is no abdominal tenderness.  Musculoskeletal:        General: No swelling or tenderness.     Cervical back: Normal range of motion and neck supple. No rigidity.     Right lower leg: No edema.     Left lower leg: No edema.  Lymphadenopathy:     Cervical: No cervical adenopathy.  Skin:    General: Skin is warm and dry.     Findings: No rash.  Neurological:     Mental Status: He is alert.     Comments: Alert, speech clear. Motor/sens grossly intact.   Psychiatric:        Mood and Affect: Mood normal.     ED Results / Procedures / Treatments   Labs (all labs ordered are listed, but only abnormal results are displayed) Results for orders placed or performed during the Booker encounter of 01/18/23  Resp panel by RT-PCR (RSV, Flu A&B, Covid) Anterior Nasal Swab   Collection Time: 01/18/23  2:00 PM   Specimen: Anterior Nasal Swab  Result Value Ref Range   SARS Coronavirus 2 by RT PCR NEGATIVE NEGATIVE   Influenza A by PCR NEGATIVE NEGATIVE   Influenza B by PCR NEGATIVE NEGATIVE   Resp Syncytial Virus by PCR NEGATIVE NEGATIVE      EKG None  Radiology DG Chest Port 1 View Result Date: 01/18/2023 CLINICAL DATA:  Shortness of breath, cough for 3 days. EXAM: PORTABLE CHEST 1 VIEW COMPARISON:  September 02, 2022. FINDINGS: The heart size and mediastinal contours are within normal limits. Both lungs are clear. Status post left shoulder arthroplasty. IMPRESSION: No active disease. Electronically Signed   By: Lupita Raider M.D.   On: 01/18/2023 10:56    Procedures Procedures    Medications Ordered in ED Medications - No data to display  ED Course/ Medical Decision Making/ A&P                                 Medical Decision Making Problems Addressed: Acute cough: acute illness or injury with systemic symptoms Bronchitis: acute  illness or injury with systemic symptoms that poses a threat to life or bodily functions Upper respiratory infection, acute: acute illness or injury with systemic symptoms that poses a threat to life or bodily functions  Amount and/or Complexity of Data Reviewed External Data Reviewed: notes. Labs: ordered. Decision-making details documented in ED Course. Radiology: ordered and independent interpretation performed. Decision-making details documented  in ED Course.  Risk Prescription drug management. Decision regarding hospitalization.  Labs ordered/sent. Imaging ordered.   Differential diagnosis includes  pneumonia, flu, covid, etc. Dispo decision including potential need for admission considered - will get labs and imaging and reassess.   Reviewed nursing notes and prior charts for additional history. External reports reviewed.   Cardiac monitor: sinus rhythm, rate 70.  Labs reviewed/interpreted by me -   Xrays reviewed/interpreted by me -   Pt is breathing comfortably. O2 sats 96%.  Pt currently appears stable for d/c.   Pt with prod cough, requests abx and cough medication. Rx for home.   Rec close pcp f/u.  Return precautions provided.          Final Clinical Impression(s) / ED Diagnoses Final diagnoses:  None    Rx / DC Orders ED Discharge Orders     None         Cathren Laine, MD 01/18/23 1605

## 2023-01-18 NOTE — ED Triage Notes (Signed)
Pt reports SHOB and cough x 3 days. Denies fevers.

## 2023-04-05 DIAGNOSIS — M65332 Trigger finger, left middle finger: Secondary | ICD-10-CM | POA: Diagnosis not present

## 2023-04-05 DIAGNOSIS — M65342 Trigger finger, left ring finger: Secondary | ICD-10-CM | POA: Diagnosis not present

## 2023-04-05 DIAGNOSIS — M65322 Trigger finger, left index finger: Secondary | ICD-10-CM | POA: Diagnosis not present

## 2023-04-19 ENCOUNTER — Telehealth: Payer: Self-pay

## 2023-04-19 NOTE — Progress Notes (Signed)
   04/19/2023  Patient ID: Travis Booker, male   DOB: [birth year], 73 y.o.   MRN: [id number]   Patient appeared on insurance report for not passing the quality metrics in 2024:  Medication Adherence for Cholesterol (MAC)   Outreach to the patient was not needed today.  Meds Tracking:  -Rosuvastatin 20 mg - Last fill 90DS on 04/09/23, LDL 39 on 04/29/22. Does not qualify for metric yet this year. Next fill 07/08/23.  Plan:  Scheduled fill history review on 07/12/23, will outreach patient if no fill for rosuvastatin.  Fayette Pho, PharmD

## 2023-07-12 ENCOUNTER — Telehealth: Payer: Self-pay

## 2023-07-12 NOTE — Telephone Encounter (Signed)
 Confirmed refills have expired at pharmacy, collaborated with PCP to send additional refills

## 2023-07-19 ENCOUNTER — Telehealth: Payer: Self-pay

## 2023-07-19 NOTE — Telephone Encounter (Signed)
 Up to date on rosuvastatin , next review in September

## 2023-10-08 ENCOUNTER — Telehealth: Payer: Self-pay | Admitting: Gastroenterology

## 2023-10-08 NOTE — Telephone Encounter (Signed)
 Patient left a message that he was hurting in lower stomach and wanted to see what it was.  I called him back and had to leave him a message asking him to call back to schedule an appt.

## 2023-10-24 ENCOUNTER — Other Ambulatory Visit (HOSPITAL_COMMUNITY): Payer: Self-pay | Admitting: Internal Medicine

## 2023-10-24 ENCOUNTER — Ambulatory Visit (HOSPITAL_COMMUNITY)
Admission: RE | Admit: 2023-10-24 | Discharge: 2023-10-24 | Disposition: A | Discharge status: Home / Self Care | Source: Ambulatory Visit | Attending: Internal Medicine | Admitting: Internal Medicine

## 2023-10-24 DIAGNOSIS — R0789 Other chest pain: Secondary | ICD-10-CM | POA: Insufficient documentation

## 2023-10-24 DIAGNOSIS — Z96612 Presence of left artificial shoulder joint: Secondary | ICD-10-CM | POA: Diagnosis not present

## 2023-10-30 ENCOUNTER — Telehealth: Payer: Self-pay | Admitting: *Deleted

## 2023-10-30 ENCOUNTER — Ambulatory Visit: Payer: Self-pay | Admitting: Gastroenterology

## 2023-10-30 VITALS — BP 150/84 | HR 65 | Temp 98.6°F | Ht 72.0 in | Wt 168.4 lb

## 2023-10-30 DIAGNOSIS — R109 Unspecified abdominal pain: Secondary | ICD-10-CM

## 2023-10-30 DIAGNOSIS — K219 Gastro-esophageal reflux disease without esophagitis: Secondary | ICD-10-CM

## 2023-10-30 DIAGNOSIS — K5900 Constipation, unspecified: Secondary | ICD-10-CM | POA: Diagnosis not present

## 2023-10-30 DIAGNOSIS — R634 Abnormal weight loss: Secondary | ICD-10-CM

## 2023-10-30 NOTE — Patient Instructions (Signed)
 Please have blood work done.  For constipation, you can take 1 capful of Miralax mixed in 8 ounces of water  daily as needed.   We are arranging a CT scan.  We are also arranging an upper endoscopy with Dr. Shaaron.   You will need to stop Eliquis  2 days before the procedure.  Further recommendations to follow!  It was a pleasure to see you today. I want to create trusting relationships with patients and provide genuine, compassionate, and quality care. I truly value your feedback, so please be on the lookout for a survey regarding your visit with me today. I appreciate your time in completing this!         Therisa MICAEL Stager, PhD, ANP-BC [facility]

## 2023-10-30 NOTE — Telephone Encounter (Signed)
 Called pt, no answer and no VM Need to give CT appt for 10/16, arrival 12:15pm

## 2023-10-30 NOTE — Progress Notes (Signed)
 Gastroenterology Office Note     Primary Care Physician:  Sheryle Carwin, MD  Primary Gastroenterologist: Dr. Shaaron    Chief Complaint   Chief Complaint  Patient presents with   Follow-up    Pt here for loss of 100lbs. Right side abd pain. No gallbladder. Pt complains of gas alot     History of Present Illness   Travis Booker is a 73 y.o. male presenting today with a history of chronic right-sided abdominal pain, GERD, COPD, afib, HTN, DJD, depression, multiple adenomas in 2023, last seen in May 2023 and now presenting as self referral due to reported weight loss and abdominal pain.   He is 168 today, in 2023 a high of 178, 2022 a high of 198, and he was at his highest in 2013/2014 at 200-230.   A few weeks ago started having right-sided abdominal pain again. May go a few days without hurting. No postprandial abdominal pain. No triggers that he can tell. No N/V. Notes gas with right-sided abdominal pain. Sometimes pain will radiate over to LUQ. Will feel bubbling. No melena or hematochezia. Omeprazole 40 mg daily for GERD. No dysphagia.   States more constipated recently. Has taken an OTC agent to help. Used phillips milk of magnesia.   States he lost over a 100 lbs. Used to weigh highest of 256, down to low of 156. Stopped alcohol (beer) about 5 years ago. Will rarely drink now. Noted weight loss with this. Still has good appetite.    No prior EGD. He states in 1970 he was told he had an ulcer; he is unsure if he had an EGD or not at that time. No NSAIDs, no aspirin . Eliquis  BID.    Aug 2024 Hgb was 10.2, April 2025: Hgb 13.5, Platelt 210 Tbiil 0.6, AL phos 68, AST 21, ALT 15, TSH 0.18 in Aug 2024  Colonoscopy 2023: nine 5-9 mm polyps in rectum, distal rectum, ascending colon, cecum, one 14 mm polyp in ascending colon s/p removal, tubular adenomas. Surveillance  in 3 years.    colonoscopy February 2013 -normal rectum, single 4 mm hyperplastic polyp in the mid  sigmoid, scattered sigmoid diverticula, 1.5 cm pale yellow mucosal nodule in descending segment consistent with previously noted lipoma.    No FH colon cancer   Still smokes 2-3 days out of the week.    Past Medical History:  Diagnosis Date   Anxiety    Cervical disc disease    surgery planned for 05/25/10 (Dr. Carles)   COPD (chronic obstructive pulmonary disease) (HCC)    Depression with anxiety    DJD (degenerative joint disease)    GERD (gastroesophageal reflux disease)    HTN (hypertension)    Paroxysmal atrial fibrillation (HCC)    a. flecainide  therapy;  b. echo 6/09: EF normal, mild LVH;   c. Myoview  7/09: EF 59%, no ischemia    Past Surgical History:  Procedure Laterality Date   APPENDECTOMY     CHOLECYSTECTOMY     COLONOSCOPY  10/07/2003   normal rectum, submucosal nodule in mid-descending colon c/w lipoma   COLONOSCOPY  03/22/2011   Procedure: COLONOSCOPY;  Surgeon: Lamar CHRISTELLA Shaaron, MD;  Location: [facility];  Service: Endoscopy;  Laterality: N/A;  2:30   COLONOSCOPY WITH PROPOFOL  N/A 09/07/2021   Procedure: COLONOSCOPY WITH PROPOFOL ;  Surgeon: Shaaron Lamar CHRISTELLA, MD;  Location: [facility];  Service: Endoscopy;  Laterality: N/A;  2:00pm   fused C4, C5, C6  anterior approach   lumbar back surgery     POLYPECTOMY  09/07/2021   Procedure: POLYPECTOMY;  Surgeon: Shaaron Lamar HERO, MD;  Location: [facility];  Service: Endoscopy;;   TOTAL SHOULDER ARTHROPLASTY Left    WRIST SURGERY Left     Current Outpatient Medications  Medication Sig Dispense Refill   acetaminophen  (TYLENOL ) 325 MG tablet Take 2 tablets (650 mg total) by mouth every 6 (six) hours as needed for mild pain, fever or headache (or Fever >/= 101).     albuterol  (VENTOLIN  HFA) 108 (90 Base) MCG/ACT inhaler Inhale 1-2 puffs into the lungs every 6 (six) hours as needed for wheezing or shortness of breath.     apixaban  (ELIQUIS ) 5 MG TABS tablet Take 1 tablet (5 mg total) by mouth 2 (two) times daily. 28  tablet 0   azithromycin  (ZITHROMAX  Z-PAK) 250 MG tablet On first day (today) take two tablets po, then take one tablet a day for the next four days. 6 tablet 0   benzonatate  (TESSALON ) 100 MG capsule Take 1 capsule (100 mg total) by mouth every 8 (eight) hours. 15 capsule 0   diltiazem  (CARDIZEM  CD) 120 MG 24 hr capsule Take 1 capsule (120 mg total) by mouth daily. 90 capsule 1   escitalopram  (LEXAPRO ) 10 MG tablet Take 10 mg by mouth daily.     flecainide  (TAMBOCOR ) 100 MG tablet Take 1 tablet (100 mg total) by mouth 2 (two) times daily. 90 tablet 2   omeprazole (PRILOSEC) 40 MG capsule Take 40 mg by mouth daily.     oxyCODONE -acetaminophen  (PERCOCET) 10-325 MG tablet Take 1 tablet by mouth every 4 (four) hours as needed for moderate pain. pain     rosuvastatin  (CRESTOR ) 20 MG tablet Take 20 mg by mouth at bedtime.     No current facility-administered medications for this visit.    Allergies as of 10/30/2023   (No Known Allergies)    Family History  Problem Relation Age of Onset   Cancer Mother    Dementia Mother    Hypertension Father    Colon polyps Sister    Heart attack Brother 21   Colon polyps Brother    Colon cancer Neg Hx     Social History   Socioeconomic History   Marital status: Married    Spouse name: Not on file   Number of children: Not on file   Years of education: Not on file   Highest education level: Not on file  Occupational History   Occupation: Nutritional therapist: [employer]     Comment: [employer] in [place], [place]; been out of work 9 mos, trying to get disability due to chronic issues   Tobacco Use   Smoking status: Every Day    Current packs/day: 0.00    Average packs/day: 0.5 packs/day for 57.0 years (28.5 ttl pk-yrs)    Types: Cigarettes    Start date: 08/03/1964    Last attempt to quit: 08/03/2021    Years since quitting: 2.2   Smokeless tobacco: Never   Tobacco comments:    Restarted smoking - 1/2 pack per day   Vaping Use   Vaping  status: Never Used  Substance and Sexual Activity   Alcohol use: Not Currently    Comment: stopped drinking 1 year  sgo as of 08/2021.   Drug use: No   Sexual activity: Yes    Partners: Female  Other Topics Concern   Not on file  Social History Narrative  Not on file   Social Drivers of Health   Financial Resource Strain: Not on file  Food Insecurity: No Food Insecurity (09/02/2022)   Hunger Vital Sign    Worried About Running Out of Food in the Last Year: Never true    Ran Out of Food in the Last Year: Never true  Transportation Needs: No Transportation Needs (09/02/2022)   PRAPARE - Administrator, Civil Service (Medical): No    Lack of Transportation (Non-Medical): No  Physical Activity: Not on file  Stress: Not on file  Social Connections: Not on file  Intimate Partner Violence: Not At Risk (09/02/2022)   Humiliation, Afraid, Rape, and Kick questionnaire    Fear of Current or Ex-Partner: No    Emotionally Abused: No    Physically Abused: No    Sexually Abused: No     Review of Systems   Gen: see HPI CV: Denies chest pain, heart palpitations, peripheral edema, syncope.  Resp: Denies shortness of breath at rest or with exertion. Denies wheezing or cough.  GI: Denies dysphagia or odynophagia. Denies jaundice, hematemesis, fecal incontinence. GU : Denies urinary burning, urinary frequency, urinary hesitancy MS: Denies joint pain, muscle weakness, cramps, or limitation of movement.  Derm: Denies rash, itching, dry skin Psych: Denies depression, anxiety, memory loss, and confusion Heme: Denies bruising, bleeding, and enlarged lymph nodes.   Physical Exam   BP (!) 150/84   Pulse 65   Temp 98.6 F (37 C)   Ht 6' (1.829 m)   Wt 168 lb 6.4 oz (76.4 kg)   BMI 22.84 kg/m  General:   Alert and oriented. Pleasant and cooperative. Well-nourished and well-developed.  Head:  Normocephalic and atraumatic. Eyes:  Without icterus Abdomen:  +BS, soft, non-tender  and non-distended. No HSM noted. No guarding or rebound. No masses appreciated.  Rectal:  DRE without mass Msk:  Symmetrical without gross deformities. Normal posture. Extremities:  Without edema. Neurologic:  Alert and  oriented x4;  grossly normal neurologically. Skin:  Intact without significant lesions or rashes. Psych:  Alert and cooperative. Normal mood and affect.   Assessment   Travis Booker is a 73 y.o. male presenting today with a history of chronic right-sided abdominal pain, GERD, COPD, afib on Eliquis , HTN, DJD, depression, multiple adenomas in 2023, last seen in May 2023 and now presenting as self referral due to reported weight loss and abdominal pain.   Recurrent RUQ abdominal pain: now radiating to LUQ, no known triggers he can report, no overt GI bleeding, no dysphagia. Gallbladder is absent. Reporting weight loss close to 30 lbs over last 3 years unintentionally. Will check CBC, CMP, lipase. Prior LFTs have been normal. CT abd/pelvis as well due to abdominal pain. In setting of chronic GERD, will also arrange an EGD as none on file previously.   Constipation: mild. Miralax daily as needed. Colonoscopy on file 2023 with multiple polyps and one 14 mm polyp (adenomas) with surveillance due in 2026. No mass on DRE.    Will request to hold Eliquis  48 hours prior to procedure   PLAN    CBC, CMP, lipase, TSH, and CT scan ordered Continue omeprazole once daily Requesting to hold Eliquis  X 48 hours prior Proceed with upper endoscopy by Dr. Shaaron in near future: the risks, benefits, and alternatives have been discussed with the patient in detail. The patient states understanding and desires to proceed.  Miralax daily prn constipation as OTC agents have worked well   Therisa  W. Shirlean, PhD, ANP-BC [facility]

## 2023-10-30 NOTE — Telephone Encounter (Signed)
  Request for patient to stop medication prior to procedure   10/30/23  Travis Booker 05-13-1950  What type of surgery is being performed? EGD  When is surgery scheduled? TBD   What type of clearance is required (medical or pharmacy to hold medication or both? MEDICATION  Are there any medications that need to be held prior to surgery and how long? ELIQUIS  X 2 DAYS PRIOR  Name of physician performing surgery?  Dr. Cindie Rouse [facility]astroenterology at [facility] Phone: [phone], option 5 Fax: [phone]  Anesthesia type (none, local, MAC, general)? MAC

## 2023-10-30 NOTE — H&P (View-Only) (Signed)
 Gastroenterology Office Note     Primary Care Physician:  Sheryle Carwin, MD  Primary Gastroenterologist: Dr. Shaaron    Chief Complaint   Chief Complaint  Patient presents with   Follow-up    Pt here for loss of 100lbs. Right side abd pain. No gallbladder. Pt complains of gas alot     History of Present Illness   Travis Booker is a 73 y.o. male presenting today with a history of chronic right-sided abdominal pain, GERD, COPD, afib, HTN, DJD, depression, multiple adenomas in 2023, last seen in May 2023 and now presenting as self referral due to reported weight loss and abdominal pain.   He is 168 today, in 2023 a high of 178, 2022 a high of 198, and he was at his highest in 2013/2014 at 200-230.   A few weeks ago started having right-sided abdominal pain again. May go a few days without hurting. No postprandial abdominal pain. No triggers that he can tell. No N/V. Notes gas with right-sided abdominal pain. Sometimes pain will radiate over to LUQ. Will feel bubbling. No melena or hematochezia. Omeprazole 40 mg daily for GERD. No dysphagia.   States more constipated recently. Has taken an OTC agent to help. Used phillips milk of magnesia.   States he lost over a 100 lbs. Used to weigh highest of 256, down to low of 156. Stopped alcohol (beer) about 5 years ago. Will rarely drink now. Noted weight loss with this. Still has good appetite.    No prior EGD. He states in 1970 he was told he had an ulcer; he is unsure if he had an EGD or not at that time. No NSAIDs, no aspirin . Eliquis  BID.    Aug 2024 Hgb was 10.2, April 2025: Hgb 13.5, Platelt 210 Tbiil 0.6, AL phos 68, AST 21, ALT 15, TSH 0.18 in Aug 2024  Colonoscopy 2023: nine 5-9 mm polyps in rectum, distal rectum, ascending colon, cecum, one 14 mm polyp in ascending colon s/p removal, tubular adenomas. Surveillance  in 3 years.    colonoscopy February 2013 -normal rectum, single 4 mm hyperplastic polyp in the mid  sigmoid, scattered sigmoid diverticula, 1.5 cm pale yellow mucosal nodule in descending segment consistent with previously noted lipoma.    No FH colon cancer   Still smokes 2-3 days out of the week.    Past Medical History:  Diagnosis Date   Anxiety    Cervical disc disease    surgery planned for 05/25/10 (Dr. Carles)   COPD (chronic obstructive pulmonary disease) (HCC)    Depression with anxiety    DJD (degenerative joint disease)    GERD (gastroesophageal reflux disease)    HTN (hypertension)    Paroxysmal atrial fibrillation (HCC)    a. flecainide  therapy;  b. echo 6/09: EF normal, mild LVH;   c. Myoview  7/09: EF 59%, no ischemia    Past Surgical History:  Procedure Laterality Date   APPENDECTOMY     CHOLECYSTECTOMY     COLONOSCOPY  10/07/2003   normal rectum, submucosal nodule in mid-descending colon c/w lipoma   COLONOSCOPY  03/22/2011   Procedure: COLONOSCOPY;  Surgeon: Lamar CHRISTELLA Shaaron, MD;  Location: [facility];  Service: Endoscopy;  Laterality: N/A;  2:30   COLONOSCOPY WITH PROPOFOL  N/A 09/07/2021   Procedure: COLONOSCOPY WITH PROPOFOL ;  Surgeon: Shaaron Lamar CHRISTELLA, MD;  Location: [facility];  Service: Endoscopy;  Laterality: N/A;  2:00pm   fused C4, C5, C6  anterior approach   lumbar back surgery     POLYPECTOMY  09/07/2021   Procedure: POLYPECTOMY;  Surgeon: Shaaron Lamar HERO, MD;  Location: [facility];  Service: Endoscopy;;   TOTAL SHOULDER ARTHROPLASTY Left    WRIST SURGERY Left     Current Outpatient Medications  Medication Sig Dispense Refill   acetaminophen  (TYLENOL ) 325 MG tablet Take 2 tablets (650 mg total) by mouth every 6 (six) hours as needed for mild pain, fever or headache (or Fever >/= 101).     albuterol  (VENTOLIN  HFA) 108 (90 Base) MCG/ACT inhaler Inhale 1-2 puffs into the lungs every 6 (six) hours as needed for wheezing or shortness of breath.     apixaban  (ELIQUIS ) 5 MG TABS tablet Take 1 tablet (5 mg total) by mouth 2 (two) times daily. 28  tablet 0   azithromycin  (ZITHROMAX  Z-PAK) 250 MG tablet On first day (today) take two tablets po, then take one tablet a day for the next four days. 6 tablet 0   benzonatate  (TESSALON ) 100 MG capsule Take 1 capsule (100 mg total) by mouth every 8 (eight) hours. 15 capsule 0   diltiazem  (CARDIZEM  CD) 120 MG 24 hr capsule Take 1 capsule (120 mg total) by mouth daily. 90 capsule 1   escitalopram  (LEXAPRO ) 10 MG tablet Take 10 mg by mouth daily.     flecainide  (TAMBOCOR ) 100 MG tablet Take 1 tablet (100 mg total) by mouth 2 (two) times daily. 90 tablet 2   omeprazole (PRILOSEC) 40 MG capsule Take 40 mg by mouth daily.     oxyCODONE -acetaminophen  (PERCOCET) 10-325 MG tablet Take 1 tablet by mouth every 4 (four) hours as needed for moderate pain. pain     rosuvastatin  (CRESTOR ) 20 MG tablet Take 20 mg by mouth at bedtime.     No current facility-administered medications for this visit.    Allergies as of 10/30/2023   (No Known Allergies)    Family History  Problem Relation Age of Onset   Cancer Mother    Dementia Mother    Hypertension Father    Colon polyps Sister    Heart attack Brother 21   Colon polyps Brother    Colon cancer Neg Hx     Social History   Socioeconomic History   Marital status: Married    Spouse name: Not on file   Number of children: Not on file   Years of education: Not on file   Highest education level: Not on file  Occupational History   Occupation: Nutritional therapist: [employer]     Comment: [employer] in [place], [place]; been out of work 9 mos, trying to get disability due to chronic issues   Tobacco Use   Smoking status: Every Day    Current packs/day: 0.00    Average packs/day: 0.5 packs/day for 57.0 years (28.5 ttl pk-yrs)    Types: Cigarettes    Start date: 08/03/1964    Last attempt to quit: 08/03/2021    Years since quitting: 2.2   Smokeless tobacco: Never   Tobacco comments:    Restarted smoking - 1/2 pack per day   Vaping Use   Vaping  status: Never Used  Substance and Sexual Activity   Alcohol use: Not Currently    Comment: stopped drinking 1 year  sgo as of 08/2021.   Drug use: No   Sexual activity: Yes    Partners: Female  Other Topics Concern   Not on file  Social History Narrative  Not on file   Social Drivers of Health   Financial Resource Strain: Not on file  Food Insecurity: No Food Insecurity (09/02/2022)   Hunger Vital Sign    Worried About Running Out of Food in the Last Year: Never true    Ran Out of Food in the Last Year: Never true  Transportation Needs: No Transportation Needs (09/02/2022)   PRAPARE - Administrator, Civil Service (Medical): No    Lack of Transportation (Non-Medical): No  Physical Activity: Not on file  Stress: Not on file  Social Connections: Not on file  Intimate Partner Violence: Not At Risk (09/02/2022)   Humiliation, Afraid, Rape, and Kick questionnaire    Fear of Current or Ex-Partner: No    Emotionally Abused: No    Physically Abused: No    Sexually Abused: No     Review of Systems   Gen: see HPI CV: Denies chest pain, heart palpitations, peripheral edema, syncope.  Resp: Denies shortness of breath at rest or with exertion. Denies wheezing or cough.  GI: Denies dysphagia or odynophagia. Denies jaundice, hematemesis, fecal incontinence. GU : Denies urinary burning, urinary frequency, urinary hesitancy MS: Denies joint pain, muscle weakness, cramps, or limitation of movement.  Derm: Denies rash, itching, dry skin Psych: Denies depression, anxiety, memory loss, and confusion Heme: Denies bruising, bleeding, and enlarged lymph nodes.   Physical Exam   BP (!) 150/84   Pulse 65   Temp 98.6 F (37 C)   Ht 6' (1.829 m)   Wt 168 lb 6.4 oz (76.4 kg)   BMI 22.84 kg/m  General:   Alert and oriented. Pleasant and cooperative. Well-nourished and well-developed.  Head:  Normocephalic and atraumatic. Eyes:  Without icterus Abdomen:  +BS, soft, non-tender  and non-distended. No HSM noted. No guarding or rebound. No masses appreciated.  Rectal:  DRE without mass Msk:  Symmetrical without gross deformities. Normal posture. Extremities:  Without edema. Neurologic:  Alert and  oriented x4;  grossly normal neurologically. Skin:  Intact without significant lesions or rashes. Psych:  Alert and cooperative. Normal mood and affect.   Assessment   Travis Booker is a 73 y.o. male presenting today with a history of chronic right-sided abdominal pain, GERD, COPD, afib on Eliquis , HTN, DJD, depression, multiple adenomas in 2023, last seen in May 2023 and now presenting as self referral due to reported weight loss and abdominal pain.   Recurrent RUQ abdominal pain: now radiating to LUQ, no known triggers he can report, no overt GI bleeding, no dysphagia. Gallbladder is absent. Reporting weight loss close to 30 lbs over last 3 years unintentionally. Will check CBC, CMP, lipase. Prior LFTs have been normal. CT abd/pelvis as well due to abdominal pain. In setting of chronic GERD, will also arrange an EGD as none on file previously.   Constipation: mild. Miralax daily as needed. Colonoscopy on file 2023 with multiple polyps and one 14 mm polyp (adenomas) with surveillance due in 2026. No mass on DRE.    Will request to hold Eliquis  48 hours prior to procedure   PLAN    CBC, CMP, lipase, TSH, and CT scan ordered Continue omeprazole once daily Requesting to hold Eliquis  X 48 hours prior Proceed with upper endoscopy by Dr. Shaaron in near future: the risks, benefits, and alternatives have been discussed with the patient in detail. The patient states understanding and desires to proceed.  Miralax daily prn constipation as OTC agents have worked well   Therisa  W. Shirlean, PhD, ANP-BC [facility]

## 2023-10-31 LAB — CBC WITH DIFFERENTIAL/PLATELET
Basophils Absolute: 0 x10E3/uL (ref 0.0–0.2)
Basos: 0 %
EOS (ABSOLUTE): 0.1 x10E3/uL (ref 0.0–0.4)
Eos: 2 %
Hematocrit: 42.7 % (ref 37.5–51.0)
Hemoglobin: 14.1 g/dL (ref 13.0–17.7)
Immature Grans (Abs): 0 x10E3/uL (ref 0.0–0.1)
Immature Granulocytes: 0 %
Lymphocytes Absolute: 2.8 x10E3/uL (ref 0.7–3.1)
Lymphs: 40 %
MCH: 31.3 pg (ref 26.6–33.0)
MCHC: 33 g/dL (ref 31.5–35.7)
MCV: 95 fL (ref 79–97)
Monocytes Absolute: 0.6 x10E3/uL (ref 0.1–0.9)
Monocytes: 8 %
Neutrophils Absolute: 3.4 x10E3/uL (ref 1.4–7.0)
Neutrophils: 50 %
Platelets: 173 x10E3/uL (ref 150–450)
RBC: 4.5 x10E6/uL (ref 4.14–5.80)
RDW: 11.9 % (ref 11.6–15.4)
WBC: 6.9 x10E3/uL (ref 3.4–10.8)

## 2023-10-31 LAB — COMPREHENSIVE METABOLIC PANEL WITH GFR
ALT: 13 IU/L (ref 0–44)
AST: 20 IU/L (ref 0–40)
Albumin: 4.6 g/dL (ref 3.8–4.8)
Alkaline Phosphatase: 65 IU/L (ref 47–123)
BUN/Creatinine Ratio: 25 — ABNORMAL HIGH (ref 10–24)
BUN: 22 mg/dL (ref 8–27)
Bilirubin Total: 0.5 mg/dL (ref 0.0–1.2)
CO2: 27 mmol/L (ref 20–29)
Calcium: 9.6 mg/dL (ref 8.6–10.2)
Chloride: 99 mmol/L (ref 96–106)
Creatinine, Ser: 0.89 mg/dL (ref 0.76–1.27)
Globulin, Total: 2.4 g/dL (ref 1.5–4.5)
Glucose: 94 mg/dL (ref 70–99)
Potassium: 3.9 mmol/L (ref 3.5–5.2)
Sodium: 139 mmol/L (ref 134–144)
Total Protein: 7 g/dL (ref 6.0–8.5)
eGFR: 91 mL/min/1.73 (ref >59)

## 2023-10-31 LAB — TSH: TSH: 1.84 u[IU]/mL (ref 0.450–4.500)

## 2023-10-31 LAB — LIPASE: Lipase: 13 U/L (ref 13–78)

## 2023-10-31 NOTE — Telephone Encounter (Signed)
Called pt, no answer and not able to leave VM

## 2023-10-31 NOTE — Telephone Encounter (Signed)
 Pharmacy please advise on holding Eliquis  prior to EGD scheduled for TBD. Last labs completed on 10/30/2023. Thank you.

## 2023-11-05 NOTE — Telephone Encounter (Signed)
 Patient with diagnosis of aflutter on Eliquis  for anticoagulation.    Procedure: EGD Date of procedure: TBD   CHA2DS2-VASc Score = 3   This indicates a 3.2% annual risk of stroke. The patient's score is based upon: CHF History: 0 HTN History: 1 Diabetes History: 0 Stroke History: 0 Vascular Disease History: 1 Age Score: 1 Gender Score: 0      CrCl 81 ml/min Platelet count 173  Patient has not had an Afib/aflutter ablation in the last 3 months, DCCV within the last 4 weeks or a watchman implanted in the last 45 days   Per office protocol, patient can hold Eliquis  for 2 days prior to procedure.    **This guidance is not considered finalized until pre-operative APP has relayed final recommendations.**

## 2023-11-05 NOTE — Telephone Encounter (Signed)
   Patient Name: Travis Booker  DOB: 1950-06-27 MRN: [id number]  Primary Cardiologist: Alvan Carrier, MD  Chart reviewed as part of pre-operative protocol coverage. Pre-op clearance already addressed by colleagues in earlier phone notes. To summarize recommendations:  -Per office protocol, patient can hold Eliquis  for 2 days prior to procedure.  He can resume when medically safe to do so.  No medical clearance was requested at this time.  Will route this bundled recommendation to requesting provider via Epic fax function and remove from pre-op pool. Please call with questions.  Orren LOISE Fabry, PA-C 11/05/2023, 11:37 AM

## 2023-11-06 ENCOUNTER — Ambulatory Visit: Payer: Self-pay | Admitting: Gastroenterology

## 2023-11-06 NOTE — Telephone Encounter (Signed)
 See clearance below.

## 2023-11-06 NOTE — Telephone Encounter (Signed)
 Called pt again, no answer and no able to leave VM

## 2023-11-06 NOTE — Telephone Encounter (Signed)
 Noted. Continue with plans for procedure. Hold Eliquis  X 2 days prior.

## 2023-11-07 ENCOUNTER — Telehealth: Payer: Self-pay

## 2023-11-07 NOTE — Telephone Encounter (Signed)
 Spoke with pt. He has been scheduled for 10/24. He will come to the office to pick up his instructions. Aware needs to hold his eliquis  x 2 days prior. Also made aware of CT appointment but he was unable to do that day, rescheduled and will leave with his instructions.

## 2023-11-07 NOTE — Telephone Encounter (Signed)
 error

## 2023-11-08 ENCOUNTER — Ambulatory Visit (HOSPITAL_COMMUNITY)

## 2023-11-08 DIAGNOSIS — B351 Tinea unguium: Secondary | ICD-10-CM | POA: Diagnosis not present

## 2023-11-08 DIAGNOSIS — I739 Peripheral vascular disease, unspecified: Secondary | ICD-10-CM | POA: Diagnosis not present

## 2023-11-08 DIAGNOSIS — L84 Corns and callosities: Secondary | ICD-10-CM | POA: Diagnosis not present

## 2023-11-13 ENCOUNTER — Encounter (HOSPITAL_COMMUNITY)
Admission: RE | Admit: 2023-11-13 | Discharge: 2023-11-13 | Disposition: A | Discharge status: Home / Self Care | Source: Ambulatory Visit | Attending: Internal Medicine | Admitting: Internal Medicine

## 2023-11-13 NOTE — Pre-Procedure Instructions (Signed)
 Attempted pre-op phone call. Left VM for him to call us back.

## 2023-11-14 NOTE — Pre-Procedure Instructions (Signed)
 Attempted pre-op phone call. No answer.

## 2023-11-15 ENCOUNTER — Encounter (HOSPITAL_COMMUNITY): Payer: Self-pay

## 2023-11-15 ENCOUNTER — Other Ambulatory Visit: Payer: Self-pay

## 2023-11-15 NOTE — Telephone Encounter (Signed)
 LMTRC   Pt left vm stating he received a call, not sure what it was about or if it was about his procedure on 11/16/23

## 2023-11-15 NOTE — Telephone Encounter (Signed)
 Pt returned call.  Advised pt that it was a pre-op nurse from [facility] calling him yesterday. Pt states he has tried calling them back. Pt states he has his instructions. Informed pt of his arrival time. Pt verbalized understanding.

## 2023-11-16 ENCOUNTER — Other Ambulatory Visit: Payer: Self-pay

## 2023-11-16 ENCOUNTER — Encounter (HOSPITAL_COMMUNITY): Admission: RE | Disposition: A | Payer: Self-pay | Source: Home / Self Care | Attending: Internal Medicine

## 2023-11-16 ENCOUNTER — Ambulatory Visit (HOSPITAL_COMMUNITY): Admitting: Anesthesiology

## 2023-11-16 ENCOUNTER — Encounter (HOSPITAL_COMMUNITY): Payer: Self-pay | Admitting: Internal Medicine

## 2023-11-16 ENCOUNTER — Ambulatory Visit (HOSPITAL_COMMUNITY)
Admission: RE | Admit: 2023-11-16 | Discharge: 2023-11-16 | Disposition: A | Discharge status: Home / Self Care | Attending: Internal Medicine | Admitting: Internal Medicine

## 2023-11-16 DIAGNOSIS — K766 Portal hypertension: Secondary | ICD-10-CM | POA: Diagnosis not present

## 2023-11-16 DIAGNOSIS — F17210 Nicotine dependence, cigarettes, uncomplicated: Secondary | ICD-10-CM | POA: Diagnosis not present

## 2023-11-16 DIAGNOSIS — I480 Paroxysmal atrial fibrillation: Secondary | ICD-10-CM | POA: Insufficient documentation

## 2023-11-16 DIAGNOSIS — Z7901 Long term (current) use of anticoagulants: Secondary | ICD-10-CM | POA: Diagnosis not present

## 2023-11-16 DIAGNOSIS — Z6823 Body mass index (BMI) 23.0-23.9, adult: Secondary | ICD-10-CM | POA: Diagnosis not present

## 2023-11-16 DIAGNOSIS — K3189 Other diseases of stomach and duodenum: Secondary | ICD-10-CM | POA: Diagnosis not present

## 2023-11-16 DIAGNOSIS — Z9049 Acquired absence of other specified parts of digestive tract: Secondary | ICD-10-CM | POA: Insufficient documentation

## 2023-11-16 DIAGNOSIS — Z79899 Other long term (current) drug therapy: Secondary | ICD-10-CM | POA: Diagnosis not present

## 2023-11-16 DIAGNOSIS — K219 Gastro-esophageal reflux disease without esophagitis: Secondary | ICD-10-CM | POA: Diagnosis not present

## 2023-11-16 DIAGNOSIS — K5900 Constipation, unspecified: Secondary | ICD-10-CM | POA: Diagnosis not present

## 2023-11-16 DIAGNOSIS — M1990 Unspecified osteoarthritis, unspecified site: Secondary | ICD-10-CM | POA: Diagnosis not present

## 2023-11-16 DIAGNOSIS — F32A Depression, unspecified: Secondary | ICD-10-CM | POA: Diagnosis not present

## 2023-11-16 DIAGNOSIS — R1031 Right lower quadrant pain: Secondary | ICD-10-CM | POA: Insufficient documentation

## 2023-11-16 DIAGNOSIS — J449 Chronic obstructive pulmonary disease, unspecified: Secondary | ICD-10-CM | POA: Diagnosis not present

## 2023-11-16 DIAGNOSIS — I10 Essential (primary) hypertension: Secondary | ICD-10-CM | POA: Diagnosis not present

## 2023-11-16 DIAGNOSIS — R634 Abnormal weight loss: Secondary | ICD-10-CM | POA: Insufficient documentation

## 2023-11-16 DIAGNOSIS — Z8719 Personal history of other diseases of the digestive system: Secondary | ICD-10-CM | POA: Diagnosis not present

## 2023-11-16 DIAGNOSIS — R1011 Right upper quadrant pain: Secondary | ICD-10-CM | POA: Insufficient documentation

## 2023-11-16 DIAGNOSIS — F17200 Nicotine dependence, unspecified, uncomplicated: Secondary | ICD-10-CM | POA: Diagnosis not present

## 2023-11-16 DIAGNOSIS — Z860101 Personal history of adenomatous and serrated colon polyps: Secondary | ICD-10-CM | POA: Diagnosis not present

## 2023-11-16 HISTORY — PX: ESOPHAGOGASTRODUODENOSCOPY: SHX5428

## 2023-11-16 SURGERY — EGD (ESOPHAGOGASTRODUODENOSCOPY)
Anesthesia: General

## 2023-11-16 MED ORDER — PROPOFOL 10 MG/ML IV BOLUS
INTRAVENOUS | Status: DC | PRN
  Administered 2023-11-16: 70 mg via INTRAVENOUS
  Administered 2023-11-16: 30 mg via INTRAVENOUS
  Administered 2023-11-16: 125 ug/kg/min via INTRAVENOUS

## 2023-11-16 MED ORDER — LIDOCAINE 2% (20 MG/ML) 5 ML SYRINGE
INTRAMUSCULAR | Status: DC | PRN
  Administered 2023-11-16: 60 mg via INTRAVENOUS

## 2023-11-16 MED ORDER — LACTATED RINGERS IV SOLN: INTRAVENOUS | Status: DC | PRN

## 2023-11-16 NOTE — Anesthesia Preprocedure Evaluation (Signed)
 Anesthesia Evaluation  Patient identified by MRN, date of birth, ID band Patient awake    Reviewed: Allergy & Precautions, H&P , NPO status , Patient's Chart, lab work & pertinent test results, reviewed documented beta blocker date and time   Airway Mallampati: II  TM Distance: >3 FB Neck ROM: full    Dental no notable dental hx.    Pulmonary COPD, Current Smoker and Patient abstained from smoking.   Pulmonary exam normal breath sounds clear to auscultation       Cardiovascular Exercise Tolerance: Good hypertension, + dysrhythmias Atrial Fibrillation  Rhythm:regular Rate:Normal     Neuro/Psych  PSYCHIATRIC DISORDERS Anxiety Depression    negative neurological ROS     GI/Hepatic Neg liver ROS,GERD  ,,  Endo/Other  negative endocrine ROS    Renal/GU negative Renal ROS  negative genitourinary   Musculoskeletal   Abdominal   Peds  Hematology  (+) Blood dyscrasia, anemia   Anesthesia Other Findings   Reproductive/Obstetrics negative OB ROS                              Anesthesia Physical Anesthesia Plan  ASA: 3  Anesthesia Plan: General   Post-op Pain Management:    Induction:   PONV Risk Score and Plan: Propofol  infusion  Airway Management Planned:   Additional Equipment:   Intra-op Plan:   Post-operative Plan:   Informed Consent: I have reviewed the patients History and Physical, chart, labs and discussed the procedure including the risks, benefits and alternatives for the proposed anesthesia with the patient or authorized representative who has indicated his/her understanding and acceptance.     Dental Advisory Given  Plan Discussed with: CRNA  Anesthesia Plan Comments:         Anesthesia Quick Evaluation

## 2023-11-16 NOTE — Discharge Instructions (Addendum)
 EGD Discharge instructions Please read the instructions outlined below and refer to this sheet in the next few weeks. These discharge instructions provide you with general information on caring for yourself after you leave the hospital. Your doctor may also give you specific instructions. While your treatment has been planned according to the most current medical practices available, unavoidable complications occasionally occur. If you have any problems or questions after discharge, please call your doctor. ACTIVITY You may resume your regular activity but move at a slower pace for the next 24 hours.  Take frequent rest periods for the next 24 hours.  Walking will help expel (get rid of) the air and reduce the bloated feeling in your abdomen.  No driving for 24 hours (because of the anesthesia (medicine) used during the test).  You may shower.  Do not sign any important legal documents or operate any machinery for 24 hours (because of the anesthesia used during the test).  NUTRITION Drink plenty of fluids.  You may resume your normal diet.  Begin with a light meal and progress to your normal diet.  Avoid alcoholic beverages for 24 hours or as instructed by your caregiver.  MEDICATIONS You may resume your normal medications unless your caregiver tells you otherwise.  WHAT YOU CAN EXPECT TODAY You may experience abdominal discomfort such as a feeling of fullness or "gas" pains.  FOLLOW-UP Your doctor will discuss the results of your test with you.  SEEK IMMEDIATE MEDICAL ATTENTION IF ANY OF THE FOLLOWING OCCUR: Excessive nausea (feeling sick to your stomach) and/or vomiting.  Severe abdominal pain and distention (swelling).  Trouble swallowing.  Temperature over 101 F (37.8 C).  Rectal bleeding or vomiting of blood.       Your stomach did appear to be inflamed as did your small intestine.  Biopsies taken.  I am doubtful the findings would explain your abdominal pain  We will move the  CT scan up to next week instead of a month from now  Further recommendations to follow in the near future.    At patient request, I called Tina-wife at [phone] to make contact    resume Eliquis  tomorrow, 11/17/2023

## 2023-11-16 NOTE — Interval H&P Note (Signed)
 History and Physical Interval Note:  11/16/2023 11:39 AM  Travis Booker  has presented today for surgery, with the diagnosis of WT LOSS, HX GERD.  The various methods of treatment have been discussed with the patient and family. After consideration of risks, benefits and other options for treatment, the patient has consented to  Procedure(s) with comments: EGD (ESOPHAGOGASTRODUODENOSCOPY) (N/A) - 1030AM, ASA 3 as a surgical intervention.  The patient's history has been reviewed, patient examined, no change in status, stable for surgery.  I have reviewed the patient's chart and labs.  Questions were answered to the patient's satisfaction.     Travis Booker   not much change in right sided abdominal pain CT not scheduled till November 24.  Labs all good.  Patient denies dysphagia.  Diagnostic EGD today per plan. The risks, benefits, limitations, alternatives and imponderables have been reviewed with the patient. Potential for esophageal dilation, biopsy, etc. have also been reviewed.  Questions have been answered. All parties agreeable.

## 2023-11-16 NOTE — Op Note (Signed)
 [facility] Patient Name: Travis Booker Procedure Date: 11/16/2023 11:19 AM MRN: [id number] Date of Birth: [birth year] Attending MD: Lamar Ozell Hollingshead , MD, [id number] CSN: [id number] Age: 73 Admit Type: Outpatient Procedure:                Upper GI endoscopy Indications:              Abdominal pain in the right upper quadrant,                            Abdominal pain in the right lower quadrant Providers:                Lamar Ozell Hollingshead, MD, Madelin Hunter, RN, Dorcas Lenis, Technician Referring MD:             Lamar Ozell Hollingshead, MD Medicines:                Propofol  per Anesthesia Complications:            No immediate complications. Estimated Blood Loss:     Estimated blood loss was minimal. Procedure:                Pre-Anesthesia Assessment:                           - Prior to the procedure, a History and Physical                            was performed, and patient medications and                            allergies were reviewed. The patient's tolerance of                            previous anesthesia was also reviewed. The risks                            and benefits of the procedure and the sedation                            options and risks were discussed with the patient.                            All questions were answered, and informed consent                            was obtained. Prior Anticoagulants: The patient has                            taken no anticoagulant or antiplatelet agents.                            After reviewing the risks and benefits, the patient  was deemed in satisfactory condition to undergo the                            procedure.                           After obtaining informed consent, the endoscope was                            passed under direct vision. Throughout the                            procedure, the patient's blood pressure, pulse, and                             oxygen saturations were monitored continuously. The                            Endoscope was introduced through the mouth, and                            advanced to the third part of duodenum. Scope In: 11:49:14 AM Scope Out: 11:56:10 AM Total Procedure Duration: 0 hours 6 minutes 56 seconds  Findings:      The examined esophagus was normal.      Severe portal hypertensive gastropathy was found in the entire examined       stomach. No varices,      Examination the bulb second third portion reveals slightly dusky       appearing small bowel mucosa. Biopsies taken. Also, biopsies taken of       the gastric mucosa to rule out H. pylori. Impression:               - Normal esophagus.                           - Portal hypertensive gastropathy. Status post                            biopsy to rule out H. pylori                           - Abnormal appearing duodenal mucosa as                            described?"status post biopsy                           - Patient has significant symptoms with weight                            loss. CT not yet done is scheduled for a month from                            now. I do not think these findings would explain  his pain all of his symptoms. Moderate Sedation:      Moderate (conscious) sedation was personally administered by an       anesthesia professional. The following parameters were monitored: oxygen       saturation, heart rate, blood pressure, respiratory rate, EKG, adequacy       of pulmonary ventilation, and response to care. Recommendation:           - Patient has a contact number available for                            emergencies. The signs and symptoms of potential                            delayed complications were discussed with the                            patient. Return to normal activities tomorrow.                            Written discharge instructions were provided to the                             patient.                           - Advance diet as tolerated. Follow-up pathology.                            Resume Eliquis  tomorrow. Move up CT of the abdomen                            and make it a CTA abdomen within the next week.                            Further recommendations to follow. Procedure Code(s):        --- Professional ---                           [id number], Esophagogastroduodenoscopy, flexible,                            transoral; diagnostic, including collection of                            specimen(s) by brushing or washing, when performed                            (separate procedure) Diagnosis Code(s):        --- Professional ---                           K76.6, Portal hypertension                           K31.89, Other diseases of stomach and duodenum  R10.11, Right upper quadrant pain                           R10.31, Right lower quadrant pain CPT copyright 2022 American Medical Association. All rights reserved. The codes documented in this report are preliminary and upon coder review may  be revised to meet current compliance requirements. Lamar HERO. Rolla Servidio, MD Lamar Ozell Hollingshead, MD 11/16/2023 12:08:38 PM This report has been signed electronically. Number of Addenda: 0

## 2023-11-16 NOTE — Anesthesia Postprocedure Evaluation (Signed)
 Anesthesia Post Note  Patient: Travis Booker  Procedure(s) Performed: EGD (ESOPHAGOGASTRODUODENOSCOPY)  Patient location during evaluation: Phase II Anesthesia Type: General Level of consciousness: awake Pain management: pain level controlled Vital Signs Assessment: post-procedure vital signs reviewed and stable Respiratory status: spontaneous breathing and respiratory function stable Cardiovascular status: blood pressure returned to baseline and stable Postop Assessment: no headache and no apparent nausea or vomiting Anesthetic complications: no Comments: Late entry   No notable events documented.   Last Vitals:  Vitals:   11/16/23 1025 11/16/23 1204  BP: (!) 143/77 (!) 101/46  Pulse: (!) 49   Resp: 14 15  Temp: 36.8 C 36.6 C  SpO2: 100% 100%    Last Pain:  Vitals:   11/16/23 1204  TempSrc: Axillary  PainSc: 0-No pain                 Yvonna JINNY Bosworth

## 2023-11-16 NOTE — Transfer of Care (Signed)
 Immediate Anesthesia Transfer of Care Note  Patient: Travis Booker  Procedure(s) Performed: EGD (ESOPHAGOGASTRODUODENOSCOPY)  Patient Location: Short Stay  Anesthesia Type:General  Level of Consciousness: drowsy and patient cooperative  Airway & Oxygen Therapy: Patient Spontanous Breathing  Post-op Assessment: Report given to RN and Post -op Vital signs reviewed and stable  Post vital signs: Reviewed and stable  Last Vitals:  Vitals Value Taken Time  BP    Temp    Pulse    Resp    SpO2      Last Pain:  Vitals:   11/16/23 1142  TempSrc:   PainSc: 0-No pain         Complications: No notable events documented.

## 2023-11-16 NOTE — Anesthesia Procedure Notes (Signed)
 Date/Time: 11/16/2023 11:43 AM  Performed by: Para Jerelene CROME, CRNAOxygen Delivery Method: Nasal cannula Comments: OptiFlow Nasal Cannula.

## 2023-11-19 ENCOUNTER — Telehealth: Payer: Self-pay | Admitting: *Deleted

## 2023-11-19 ENCOUNTER — Encounter (HOSPITAL_COMMUNITY): Payer: Self-pay | Admitting: Internal Medicine

## 2023-11-19 ENCOUNTER — Other Ambulatory Visit: Payer: Self-pay | Admitting: *Deleted

## 2023-11-19 ENCOUNTER — Ambulatory Visit: Payer: Self-pay | Admitting: Internal Medicine

## 2023-11-19 DIAGNOSIS — R634 Abnormal weight loss: Secondary | ICD-10-CM

## 2023-11-19 DIAGNOSIS — R109 Unspecified abdominal pain: Secondary | ICD-10-CM

## 2023-11-19 LAB — SURGICAL PATHOLOGY

## 2023-11-19 NOTE — Telephone Encounter (Signed)
 Order changed to CTA. Called pt, LMOVM to call back CTA scheduled for 10/29, arrival 10:45am

## 2023-11-19 NOTE — Telephone Encounter (Signed)
-----   Message from Travis Booker sent at 11/16/2023 12:00 PM EDT -----   Patient has changes of portal gastropathy and has a dusky looking small bowel.  Biopsies taken.  He needs a CTA abdomen.  He is scheduled for a CT 11/24.  That needs to be moved up to next week.

## 2023-11-19 NOTE — Telephone Encounter (Signed)
 Spoke with pt and he is aware of appt details.

## 2023-11-21 ENCOUNTER — Ambulatory Visit (HOSPITAL_COMMUNITY)
Admission: RE | Admit: 2023-11-21 | Discharge: 2023-11-21 | Disposition: A | Discharge status: Home / Self Care | Source: Ambulatory Visit | Attending: Internal Medicine | Admitting: Internal Medicine

## 2023-11-21 DIAGNOSIS — R109 Unspecified abdominal pain: Secondary | ICD-10-CM | POA: Insufficient documentation

## 2023-11-21 DIAGNOSIS — R634 Abnormal weight loss: Secondary | ICD-10-CM | POA: Insufficient documentation

## 2023-11-21 DIAGNOSIS — K319 Disease of stomach and duodenum, unspecified: Secondary | ICD-10-CM | POA: Diagnosis not present

## 2023-11-21 MED ORDER — IOHEXOL 350 MG/ML SOLN
100.0000 mL | Freq: Once | INTRAVENOUS | Status: AC | PRN
  Administered 2023-11-21: 100 mL via INTRAVENOUS

## 2023-11-26 ENCOUNTER — Telehealth: Payer: Self-pay

## 2023-11-26 NOTE — Telephone Encounter (Signed)
 Pt is requesting results to recent CT. Pt states that he is still having the abdominal pain.

## 2023-11-27 ENCOUNTER — Ambulatory Visit: Payer: Self-pay | Admitting: Internal Medicine

## 2023-11-27 DIAGNOSIS — I4821 Permanent atrial fibrillation: Secondary | ICD-10-CM | POA: Diagnosis not present

## 2023-11-27 DIAGNOSIS — M1990 Unspecified osteoarthritis, unspecified site: Secondary | ICD-10-CM | POA: Diagnosis not present

## 2023-11-27 DIAGNOSIS — I700 Atherosclerosis of aorta: Secondary | ICD-10-CM | POA: Diagnosis not present

## 2023-11-27 DIAGNOSIS — R252 Cramp and spasm: Secondary | ICD-10-CM | POA: Diagnosis not present

## 2023-11-27 DIAGNOSIS — Z125 Encounter for screening for malignant neoplasm of prostate: Secondary | ICD-10-CM | POA: Diagnosis not present

## 2023-11-27 DIAGNOSIS — Z79899 Other long term (current) drug therapy: Secondary | ICD-10-CM | POA: Diagnosis not present

## 2023-11-27 DIAGNOSIS — M47812 Spondylosis without myelopathy or radiculopathy, cervical region: Secondary | ICD-10-CM | POA: Diagnosis not present

## 2023-12-04 NOTE — Telephone Encounter (Signed)
 Pt called and left a vm stating that someone had tried to call him. I am wondering if it was you to arrange ov.

## 2023-12-10 ENCOUNTER — Encounter (HOSPITAL_COMMUNITY): Payer: Self-pay | Admitting: Internal Medicine

## 2023-12-10 ENCOUNTER — Other Ambulatory Visit (HOSPITAL_COMMUNITY): Payer: Self-pay | Admitting: Internal Medicine

## 2023-12-10 DIAGNOSIS — F17200 Nicotine dependence, unspecified, uncomplicated: Secondary | ICD-10-CM

## 2023-12-10 DIAGNOSIS — Z122 Encounter for screening for malignant neoplasm of respiratory organs: Secondary | ICD-10-CM

## 2023-12-16 ENCOUNTER — Ambulatory Visit (HOSPITAL_COMMUNITY)
Admission: RE | Admit: 2023-12-16 | Discharge: 2023-12-16 | Disposition: A | Discharge status: Home / Self Care | Source: Ambulatory Visit | Attending: Internal Medicine | Admitting: Internal Medicine

## 2023-12-16 DIAGNOSIS — F17200 Nicotine dependence, unspecified, uncomplicated: Secondary | ICD-10-CM | POA: Insufficient documentation

## 2023-12-16 DIAGNOSIS — Z122 Encounter for screening for malignant neoplasm of respiratory organs: Secondary | ICD-10-CM | POA: Insufficient documentation

## 2023-12-16 DIAGNOSIS — F17210 Nicotine dependence, cigarettes, uncomplicated: Secondary | ICD-10-CM | POA: Diagnosis not present

## 2023-12-17 ENCOUNTER — Other Ambulatory Visit (HOSPITAL_COMMUNITY)

## 2024-01-02 DIAGNOSIS — H43813 Vitreous degeneration, bilateral: Secondary | ICD-10-CM | POA: Diagnosis not present

## 2024-01-02 DIAGNOSIS — H25013 Cortical age-related cataract, bilateral: Secondary | ICD-10-CM | POA: Diagnosis not present

## 2024-01-02 DIAGNOSIS — H52203 Unspecified astigmatism, bilateral: Secondary | ICD-10-CM | POA: Diagnosis not present

## 2024-01-02 DIAGNOSIS — H5202 Hypermetropia, left eye: Secondary | ICD-10-CM | POA: Diagnosis not present

## 2024-01-02 DIAGNOSIS — H2513 Age-related nuclear cataract, bilateral: Secondary | ICD-10-CM | POA: Diagnosis not present

## 2024-01-02 DIAGNOSIS — H524 Presbyopia: Secondary | ICD-10-CM | POA: Diagnosis not present

## 2024-01-04 ENCOUNTER — Ambulatory Visit: Admitting: Gastroenterology

## 2024-02-06 ENCOUNTER — Encounter: Payer: Self-pay | Admitting: Emergency Medicine

## 2024-02-06 ENCOUNTER — Encounter: Payer: Self-pay | Admitting: Pulmonary Disease

## 2024-02-06 ENCOUNTER — Telehealth: Payer: Self-pay | Admitting: Emergency Medicine

## 2024-02-06 ENCOUNTER — Ambulatory Visit (INDEPENDENT_AMBULATORY_CARE_PROVIDER_SITE_OTHER): Admitting: Emergency Medicine

## 2024-02-06 VITALS — BP 116/64 | HR 55 | Temp 97.6°F | Ht 72.0 in | Wt 172.0 lb

## 2024-02-06 DIAGNOSIS — F17210 Nicotine dependence, cigarettes, uncomplicated: Secondary | ICD-10-CM

## 2024-02-06 DIAGNOSIS — J449 Chronic obstructive pulmonary disease, unspecified: Secondary | ICD-10-CM | POA: Diagnosis not present

## 2024-02-06 DIAGNOSIS — R911 Solitary pulmonary nodule: Secondary | ICD-10-CM | POA: Insufficient documentation

## 2024-02-06 MED ORDER — SPIRIVA RESPIMAT 2.5 MCG/ACT IN AERS: 2.0000 | INHALATION_SPRAY | Freq: Every day | RESPIRATORY_TRACT | Status: AC

## 2024-02-06 NOTE — Progress Notes (Signed)
 "  Subjective:    Patient ID: Travis Booker, male    DOB: 1950/10/18, 74 y.o.   MRN: [id number]  HPI Discussed the use of AI scribe software for clinical note transcription with the patient, who gave verbal consent to proceed.  History of Present Illness Travis Booker is a 74 year old male with COPD who presents for evaluation of a lung nodule.  He has a history of smoking for approximately 54-55 years, smoking up to a pack a day, but has quit smoking recently. He has been participating in a lung cancer screening program, with CT scans dating back to 2015. The most recent CT scan from December 16, 2023, showed an increase in size of a left lower lobe nodule to 14 millimeters, compared to 6 millimeters on a prior scan. Other nodules were stable.  He experiences pain under his right armpit, which prompted a visit to his doctor where the nodule was discussed. He has been experiencing a cough and production of thick sputum for the past two to three months, occurring at various times throughout the day, but predominantly in the morning. He is not currently on any scheduled bronchodilator therapy, although he has used albuterol  in the past but cannot recall its effectiveness.  Pulmonary function testing from September 18, 2014, showed severe obstruction with an FEV1 of 2.0 liters or 53% predicted. Lung volumes showed a normal total lung capacity, but diffusion capacity was decreased and did not fully correct when adjusted for alveolar volume. He reports occasional shortness of breath, particularly when walking uphill or fast, but remains active, going to the grocery store and engaging in daily activities.  His past medical history includes atrial fibrillation for which he is on Eliquis , GERD, hypertension, and depression. He had COVID-19 in August 2024, requiring a short hospitalization, and was seen in the emergency department on January 18, 2023, with viral symptoms but did not  require admission. He was treated with antibiotics and prednisone  at that time.  Social history reveals he has lived locally all his life and worked at a cigarette factory for five years, where he was exposed to cigarette dust and possibly some chemicals, including menthol sprays.    Results Radiology Chest CT (12/16/2023): No mediastinal or hilar adenopathy. Mild emphysematous changes. Diffuse bronchial wall thickening with occasional mucoid impaction. Layering secretions in distal trachea and right main stem bronchus. Left lower lobe nodule increased to 14 mm from 6 mm previously. Other nodules stable, including elongated right middle lobe nodule now 7 mm. (Independently interpreted) Chest CT (04/18/2021): Bronchial wall thickening. Stable small pulmonary nodule with linear right middle lobe opacity measuring 9.5 mm in right middle lobe. 2.3 mm nodule in right upper lobe. (Independently interpreted) Chest CT (03/31/2020): Bronchial wall thickening. Stable small pulmonary nodule with linear right middle lobe opacity measuring 9.5 mm in right middle lobe. 2.3 mm nodule in right upper lobe. (Independently interpreted) Chest CT (10/17/2013): No mediastinal or hilar adenopathy. No infiltrates or pulmonary abnormalities. (Independently interpreted)  Diagnostic Pulmonary function testing (09/18/2014): Severe obstruction. FEV1 2.0 L (53% predicted). Normal TLC. Decreased diffusion capacity not fully corrected when adjusted for alveolar volume.  (Independently interpreted)    Review of Systems As per HPI  Past Medical History:  Diagnosis Date   Anxiety    Cervical disc disease    surgery planned for 05/25/10 (Dr. Carles)   COPD (chronic obstructive pulmonary disease) (HCC)    Depression with anxiety    DJD (degenerative joint disease)  GERD (gastroesophageal reflux disease)    HTN (hypertension)    Paroxysmal atrial fibrillation (HCC)    a. flecainide  therapy;  b. echo 6/09: EF normal, mild  LVH;   c. Myoview  7/09: EF 59%, no ischemia    Family History  Problem Relation Age of Onset   Cancer Mother    Dementia Mother    Hypertension Father    Colon polyps Sister    Heart attack Brother 25   Colon polyps Brother    Colon cancer Neg Hx      Social History   Socioeconomic History   Marital status: Married    Spouse name: Not on file   Number of children: Not on file   Years of education: Not on file   Highest education level: Not on file  Occupational History   Occupation: Nutritional Therapist: [employer]     Comment: [employer] in [place], [place]; been out of work 9 mos, trying to get disability due to chronic issues   Tobacco Use   Smoking status: Every Day    Current packs/day: 0.00    Average packs/day: 0.5 packs/day for 57.0 years (28.5 ttl pk-yrs)    Types: Cigarettes    Start date: 08/03/1964    Last attempt to quit: 08/03/2021    Years since quitting: 2.5   Smokeless tobacco: Never   Tobacco comments:    Quit couple months ago. 02/06/2024  Vaping Use   Vaping status: Never Used  Substance and Sexual Activity   Alcohol use: Not Currently    Comment: stopped drinking 1 year  sgo as of 08/2021.   Drug use: No   Sexual activity: Yes    Partners: Female  Other Topics Concern   Not on file  Social History Narrative   Not on file   Social Drivers of Health   Tobacco Use: High Risk (02/06/2024)   Patient History    Smoking Tobacco Use: Every Day    Smokeless Tobacco Use: Never    Passive Exposure: Not on file  Financial Resource Strain: Not on file  Food Insecurity: No Food Insecurity (09/02/2022)   Hunger Vital Sign    Worried About Running Out of Food in the Last Year: Never true    Ran Out of Food in the Last Year: Never true  Transportation Needs: No Transportation Needs (09/02/2022)   PRAPARE - Administrator, Civil Service (Medical): No    Lack of Transportation (Non-Medical): No  Physical Activity: Not on file  Stress: Not on file   Social Connections: Not on file  Intimate Partner Violence: Not At Risk (09/02/2022)   Humiliation, Afraid, Rape, and Kick questionnaire    Fear of Current or Ex-Partner: No    Emotionally Abused: No    Physically Abused: No    Sexually Abused: No  Depression (PHQ2-9): Not on file  Alcohol Screen: Not on file  Housing: Low Risk (09/02/2022)   Housing    Last Housing Risk Score: 0  Utilities: Not At Risk (09/02/2022)   AHC Utilities    Threatened with loss of utilities: No  Health Literacy: Not on file    Allergies[1]  Medications Ordered Prior to Encounter[2]     Objective:    Vitals:   02/06/24 0935  BP: 116/64  Pulse: (!) 55  Temp: 97.6 F (36.4 C)  TempSrc: Oral  SpO2: 94%  Weight: 172 lb (78 kg)  Height: 6' (1.829 m)   Physical Exam Gen: Pleasant,  well-nourished, in no distress,  normal affect  ENT: No lesions,  mouth clear,  oropharynx clear, no postnasal drip  Neck: No JVD, no stridor  Lungs: No use of accessory muscles, no crackles or wheezing on normal respiration, no wheeze on forced expiration  Cardiovascular: RRR, heart sounds normal, no murmur or gallops, no peripheral edema  Musculoskeletal: No deformities, no cyanosis or clubbing  Neuro: alert, awake, non focal  Skin: Warm, no lesions or rashes       Assessment & Plan:   Assessment & Plan Left lower lobe pulmonary nodule   Assessment and Plan Assessment & Plan Enlarging left lower lobe pulmonary nodule Nodule increased from 6 mm to 14 mm. Concern for lung cancer due to tobacco history. - Scheduled bronchoscopy for January 29th, 2026, at [facility] in [place] with Dr. Kara. - Instructed to stop Eliquis  two days prior to bronchoscopy. - Discussed risks of bronchoscopy, including a 2% risk of pneumothorax.  Chronic obstructive pulmonary disease Suspected COPD with severe obstruction per 2016 pulmonary function tests. No current bronchodilator therapy. - Prescribed Spiriva   inhaler for daily use to assess improvement in breathing and mucus clearance. - Provided samples of Spiriva    Tobacco use disorder Significant history of smoking one pack per day for 54-55 years. Quit smoking.   I personally spent a total of 60 minutes in the care of the patient today including preparing to see the patient, getting/reviewing separately obtained history, performing a medically appropriate exam/evaluation, counseling and educating, placing orders, documenting clinical information in the EHR, independently interpreting results, communicating results, and coordinating care.   No follow-ups on file.   Lamar Chris, MD, PhD 02/06/2024, 1:19 PM [facility] [phone] or if no answer before 7:00PM call [phone] For any issues after 7:00PM please call eLink [phone]     [1] No Known Allergies [2]  Current Outpatient Medications on File Prior to Visit  Medication Sig Dispense Refill   acetaminophen  (TYLENOL ) 325 MG tablet Take 2 tablets (650 mg total) by mouth every 6 (six) hours as needed for mild pain, fever or headache (or Fever >/= 101).     apixaban  (ELIQUIS ) 5 MG TABS tablet Take 1 tablet (5 mg total) by mouth 2 (two) times daily. 28 tablet 0   diltiazem  (CARDIZEM  CD) 120 MG 24 hr capsule Take 1 capsule (120 mg total) by mouth daily. 90 capsule 1   escitalopram  (LEXAPRO ) 10 MG tablet Take 10 mg by mouth daily.     flecainide  (TAMBOCOR ) 100 MG tablet Take 1 tablet (100 mg total) by mouth 2 (two) times daily. 90 tablet 2   omeprazole (PRILOSEC) 40 MG capsule Take 40 mg by mouth daily.     oxyCODONE -acetaminophen  (PERCOCET) 10-325 MG tablet Take 1 tablet by mouth every 4 (four) hours as needed for moderate pain. pain     rosuvastatin  (CRESTOR ) 20 MG tablet Take 20 mg by mouth at bedtime.     albuterol  (VENTOLIN  HFA) 108 (90 Base) MCG/ACT inhaler Inhale 1-2 puffs into the lungs every 6 (six) hours as needed for wheezing or shortness of breath.  (Patient not taking: Reported on 02/06/2024)     benzonatate  (TESSALON ) 100 MG capsule Take 1 capsule (100 mg total) by mouth every 8 (eight) hours. (Patient not taking: Reported on 02/06/2024) 15 capsule 0   No current facility-administered medications on file prior to visit.   "

## 2024-02-06 NOTE — Patient Instructions (Signed)
" °  VISIT SUMMARY: You came in today for an evaluation of a lung nodule. You have a history of smoking and have been participating in a lung cancer screening program. Your recent CT scan showed an increase in the size of a nodule in your left lower lung. You also reported experiencing pain under your right armpit, a cough, and thick sputum production. We discussed your history of COPD and your past medical history, including atrial fibrillation, GERD, hypertension, and depression.  YOUR PLAN: -ENLARGING LEFT LOWER LOBE PULMONARY NODULE: The nodule in your left lower lung has increased in size, which raises concern for lung cancer due to your history of smoking. We will work on scheduling a bronchoscopy, hopefully for January 29th, 2026, at [facility] in [place] to further evaluate the nodule. Please stop taking Eliquis  two days before the procedure. We discussed the risks of bronchoscopy, including a 2% risk of a collapsed lung.  -CHRONIC OBSTRUCTIVE PULMONARY DISEASE (COPD): COPD is a chronic lung disease that makes it hard to breathe. Your past tests showed severe obstruction, and you are not currently on any bronchodilator therapy. We will do a trial of Spiriva  inhaler for daily use to help improve your breathing and clear mucus.  If you different from this then we can consider continuing going forward.  -TOBACCO USE DISORDER: You have a significant history of smoking one pack per day for 54-55 years but have recently quit smoking. This is a positive step for your health.  INSTRUCTIONS: Please stop taking Eliquis  two days before your scheduled bronchoscopy on January 29th, 2026. Continue using the Spiriva  inhaler daily as prescribed. Follow up with any new or worsening symptoms.     "

## 2024-02-06 NOTE — Telephone Encounter (Signed)
 Letter given by [facility] case# [id number] will send to [facility] to check auth

## 2024-02-06 NOTE — Telephone Encounter (Signed)
 Please schedule the following:  Provider performing procedure: Dewald Diagnosis: Left lower lobe nodule Which side if for nodule / mass?  Left Procedure: Robotic assisted navigational bronchoscopy Has patient been spoken to by Provider and given informed consent?  Yes Anesthesia: General Do you need Fluro?  Yes Duration of procedure: 45 minutes Date: 02/21/2024 Alternate Date:   Time: Patient prefers p.m. Location: [facility] endoscopy Does patient have OSA?  No DM?  No or Latex allergy?  No Medication Restriction/ Anticoagulate/Antiplatelet: Stop Eliquis  2 days prior Pre-op Labs Ordered:determined by Anesthesia Imaging request: Super D CT chest will be ordered on 02/06/2024 at [facility] (If, SuperDimension CT Chest, please have STAT courier sent to ENDO)

## 2024-02-13 ENCOUNTER — Ambulatory Visit (HOSPITAL_COMMUNITY)
Admission: RE | Admit: 2024-02-13 | Discharge: 2024-02-13 | Disposition: A | Discharge status: Home / Self Care | Source: Ambulatory Visit | Attending: Emergency Medicine | Admitting: Emergency Medicine

## 2024-02-13 DIAGNOSIS — R911 Solitary pulmonary nodule: Secondary | ICD-10-CM | POA: Diagnosis present

## 2024-02-18 ENCOUNTER — Encounter (HOSPITAL_COMMUNITY): Payer: Self-pay | Admitting: Pulmonary Disease

## 2024-02-18 ENCOUNTER — Other Ambulatory Visit: Payer: Self-pay

## 2024-02-18 NOTE — Progress Notes (Signed)
 SDW call  Patient was given pre-op instructions over the phone. Patient verbalized understanding of instructions provided.  Denied any SOB or fever.  States he has a coarse cough which is why he is having the Bronch.    PCP - Dr. Gaither Langton Cardiologist - Dr. Dorn Ross Pulmonary:    PPM/ICD - denies Device Orders - na Rep Notified - na   Chest x-ray -  EKG -  DOS, 02/21/2024 Stress Test -2/28/20224 ECHO - 03/22/2022 Cardiac Cath -   Sleep Study/sleep apnea/CPAP: + OSA, does not wear a CPAP  Non-diabetic  Blood Thinner Instructions: Eliquis , hold 2 days, last dose 02/18/24 Aspirin  Instructions:denies   ERAS Protcol - NPO  Anesthesia review: Yes. A-fib, HTN, COPD, OSA, on eliquis   Your procedure is scheduled on Thursday February 21, 2024  Report to [facility] Main Entrance A at  0645   A.M., then check in with the Admitting office.  Call this number if you have problems the morning of surgery:  [phone]   If you have any questions prior to your surgery date call [phone]: Open Monday-Friday 8am-4pm If you experience any cold or flu symptoms such as cough, fever, chills, shortness of breath, etc. between now and your scheduled surgery, please notify us  at the above number    Remember:  Do not eat or drink after midnight the night before your surgery  Take these medicines the morning of surgery with A SIP OF WATER :  Diltiazem , lexapro , flecainide , omeprazole, spiriva , metoprolol   As needed: Tylenol , percocet  As of today, STOP taking any Aspirin  (unless otherwise instructed by your surgeon) Aleve, Naproxen, Ibuprofen, Motrin, Advil, Goody's, BC's, all herbal medications, fish oil, and all vitamins.

## 2024-02-19 ENCOUNTER — Ambulatory Visit: Payer: Self-pay | Admitting: Emergency Medicine

## 2024-02-19 NOTE — Telephone Encounter (Addendum)
 FYI Only or Action Required?: Action required by provider: ED refused wants antibiotics prescribed.  Patient is followed in Pulmonology for lung mass, last seen on 02/06/2024 by Shelah Lamar RAMAN, MD.  Called Nurse Triage reporting Cough. Wheezing,SOB brown and green phlegm  Symptoms began several days ago.  Interventions attempted: Rescue inhaler.  Symptoms are: rapidly worsening.  Triage Disposition: Go to ED Now (Notify PCP), See Physician Within 24 Hours  Patient/caregiver understands and will follow disposition?: NO refuses ED                             E2C2 Pulmonary Triage - Initial Assessment Questions Chief Complaint (e.g., cough, sob, wheezing, fever, chills, sweat or additional symptoms) *Go to specific symptom protocol after initial questions. Wheezing and cough  How long have symptoms been present? 3-4 days  Have you tested for COVID or Flu? Note: If not, ask patient if a home test can be taken. If so, instruct patient to call back for positive results. No  MEDICINES:   Have you used any OTC meds to help with symptoms? No If yes, ask What medications? no  Have you used your inhalers/maintenance medication? Yes If yes, What medications? inhaler  If inhaler, ask How many puffs and how often? Note: Review instructions on medication in the chart. 2 times a day  OXYGEN: Do you wear supplemental oxygen? No If yes, How many liters are you supposed to use? na  Do you monitor your oxygen levels? No If yes, What is your reading (oxygen level) today?   What is your usual oxygen saturation reading?  (Note: Pulmonary O2 sats should be 90% or greater)             Reason for Triage: Patient [phone] states is suppose to have a bronchoscopy procedure with Dr. Shelah 02/21/24, and may need to reschedule. Patient states started feeling sick a couple of days ago and needs antibiotics. Patient coughing phlegm green  and dark, body aches/pain in neck, shoulders, right knee and back, feeling warm, unsure if he has a fever, shortness of breath, wheezing, and dizziness. Please advise.   [address] - [place], [place] -  [address] [place] [address]  Phone: [phone] Fax: [phone]    Reason for Disposition  [1] Continuous (nonstop) coughing interferes with work or school AND [2] no improvement using cough treatment per Care Advice  [1] MODERATE difficulty breathing (e.g., speaks in phrases, SOB even at rest, pulse 100-120) AND [2] still present when not coughing  Answer Assessment - Initial Assessment Questions 1. ONSET: When did the cough begin?      3-4 days 2. SEVERITY: How bad is the cough today?      moderate 3. SPUTUM: Describe the color of your sputum (e.g., none, dry cough; clear, white, yellow, green)     Green and brown 4. HEMOPTYSIS: Are you coughing up any blood? If Yes, ask: How much? (e.g., flecks, streaks, tablespoons, etc.)     no 5. DIFFICULTY BREATHING: Are you having difficulty breathing? If Yes, ask: How bad is it? (e.g., mild, moderate, severe)      yes 6. FEVER: Do you have a fever? If Yes, ask: What is your temperature, how was it measured, and when did it start?     no 7. CARDIAC HISTORY: Do you have any history of heart disease? (e.g., heart attack, congestive heart failure)      afib 8. LUNG  HISTORY: Do you have any history of lung disease?  (e.g., pulmonary embolus, asthma, emphysema)     Something in lung 9. PE RISK FACTORS: Do you have a history of blood clots? (or: recent major surgery, recent prolonged travel, bedridden)     Did not ask 10. OTHER SYMPTOMS: Do you have any other symptoms? (e.g., runny nose, wheezing, chest pain)       wheezing  Protocols used: Cough - Acute Productive-A-AH

## 2024-02-19 NOTE — Telephone Encounter (Signed)
 I called and spoke with the pt's spouse  Scheduled him for ov with RB tomorrow  Advised her that should his symptoms persist or worsen he needs to go to ED  She verbalized understanding

## 2024-02-20 ENCOUNTER — Encounter (HOSPITAL_COMMUNITY): Payer: Self-pay | Admitting: Vascular Surgery

## 2024-02-20 ENCOUNTER — Ambulatory Visit

## 2024-02-20 ENCOUNTER — Telehealth: Payer: Self-pay | Admitting: Emergency Medicine

## 2024-02-20 ENCOUNTER — Encounter: Payer: Self-pay | Admitting: Emergency Medicine

## 2024-02-20 ENCOUNTER — Ambulatory Visit: Admitting: Emergency Medicine

## 2024-02-20 VITALS — BP 112/68 | HR 64 | Temp 98.6°F | Ht 72.0 in | Wt 180.4 lb

## 2024-02-20 DIAGNOSIS — R911 Solitary pulmonary nodule: Secondary | ICD-10-CM

## 2024-02-20 DIAGNOSIS — J449 Chronic obstructive pulmonary disease, unspecified: Secondary | ICD-10-CM | POA: Diagnosis not present

## 2024-02-20 DIAGNOSIS — J4489 Other specified chronic obstructive pulmonary disease: Secondary | ICD-10-CM

## 2024-02-20 DIAGNOSIS — R051 Acute cough: Secondary | ICD-10-CM

## 2024-02-20 MED ORDER — PREDNISONE 10 MG PO TABS: ORAL_TABLET | ORAL | 0 refills | Status: AC

## 2024-02-20 MED ORDER — DOXYCYCLINE HYCLATE 100 MG PO TABS: 100.0000 mg | ORAL_TABLET | Freq: Two times a day (BID) | ORAL | 0 refills | Status: DC

## 2024-02-20 NOTE — Patient Instructions (Addendum)
 We will need to postpone your bronchoscopy that is scheduled for 1/29. Go ahead and get back on your Eliquis  until we confirm the date that we will reschedule your bronchoscopy.  Once we know that date you will need to stop the Eliquis  2 days prior. Chest x-ray today Please take prednisone  as directed until completely gone Please take Doxycycline  as directed until completely gone Continue to use Spiriva  2 puffs once daily. Keep your albuterol  available to use 2 puffs when needed for shortness of breath, chest tightness, wheezing. Keep your follow-up visit on 2/6 so we can see how you are doing and plan to reschedule your procedure.

## 2024-02-20 NOTE — Progress Notes (Signed)
" ° °  Subjective:    Patient ID: Travis Booker, male    DOB: 1950/03/26, 75 y.o.   MRN: [id number]  HPI  Acute office visit 02/20/2024 --Mr. Roudebush is 68 with a history of tobacco use and associated COPD.  I saw him earlier this month for a slowly enlarging left lower lobe pulmonary nodule, now 14 mm.  We have planned for navigational bronchoscopy with Dr. Kara tomorrow 1/29 to evaluate further.  When I met him he was not on bronchodilator therapy so I asked him to try starting Spiriva . He is here today reporting that he developed URI sx on Saturday, and increased cough prod of thick purulent. No known sick contact. Associated shortness of breath, some wheeze. Has not tried any meds. Really hasn't been able to get up and around for 2 days.   Ct chest 02/13/24 reviewed by me shows persistence of his anterior left lower lobe nodule now 17 mm, other stable nodules, emphysematous change.  No infiltrate or evidence of overt pneumonia.     Review of Systems As per HPI     Objective:    Vitals:   02/20/24 1335  BP: 112/68  Pulse: 64  Temp: 98.6 F (37 C)  TempSrc: Oral  SpO2: 99%  Weight: 180 lb 6.4 oz (81.8 kg)  Height: 6' (1.829 m)   Physical Exam Gen: Pleasant, well-nourished, in no distress,  normal affect  ENT: No lesions,  mouth clear,  oropharynx clear, no postnasal drip.  Frequent cough  Neck: No JVD, no stridor  Lungs: No use of accessory muscles, very distant, soft end expiratory wheezes, bilateral rhonchi  Cardiovascular: RRR, heart sounds normal, no murmur or gallops, no peripheral edema  Musculoskeletal: No deformities, no cyanosis or clubbing  Neuro: alert, awake, non focal  Skin: Warm, no lesions or rash       Assessment & Plan:   Assessment & Plan Acute cough  COPD With evidence for an acute exacerbation in the setting of an apparent viral upper respiratory infection.  He is now on day 5-6 of symptoms, unclear whether there is any benefit in  checking COVID-19 or flu since he is outside the window for antiviral therapy.  He will need antibiotics, prednisone  taper.  Unfortunately we will need to cancel his bronchoscopy scheduled for 1/29.  Will try to get him rescheduled with either Dr. Kara or Dr. Shelah. Left lower lobe pulmonary nodule Continues to enlarge on serial imaging and certainly concerning for possible primary malignancy.  He was scheduled for navigational bronchoscopy tomorrow but now we will have to postpone.  Will work on getting him back on schedule with either Dr. Kara or Dr. Shelah.  He knows that he will have to stop his Eliquis  2 days prior.    I personally spent a total of 20 minutes in the care of the patient today including preparing to see the patient, getting/reviewing separately obtained history, performing a medically appropriate exam/evaluation, counseling and educating, placing orders, documenting clinical information in the EHR, independently interpreting results, communicating results, and coordinating care.   No follow-ups on file.   Lamar Shelah, MD, PhD 02/20/2024, 1:56 PM [facility] [phone] or if no answer before 7:00PM call [phone] For any issues after 7:00PM please call eLink [phone]    "

## 2024-02-20 NOTE — Assessment & Plan Note (Addendum)
 Continues to enlarge on serial imaging and certainly concerning for possible primary malignancy.  He was scheduled for navigational bronchoscopy tomorrow but now we will have to postpone.  Will work on getting him back on schedule with either Dr. Kara or Dr. Shelah.  He knows that he will have to stop his Eliquis  2 days prior.

## 2024-02-20 NOTE — Assessment & Plan Note (Addendum)
 With evidence for an acute exacerbation in the setting of an apparent viral upper respiratory infection.  He is now on day 5-6 of symptoms, unclear whether there is any benefit in checking COVID-19 or flu since he is outside the window for antiviral therapy.  He will need antibiotics, prednisone  taper.  Unfortunately we will need to cancel his bronchoscopy scheduled for 1/29.  Will try to get him rescheduled with either Dr. Kara or Dr. Shelah.

## 2024-02-20 NOTE — Progress Notes (Signed)
 Patient was seen at Dr Lanny office today. His Bronch for 02/21/2024 will be rescheduled due to COPD exacerbation. Notified Lonell in Endo

## 2024-02-21 ENCOUNTER — Encounter (HOSPITAL_COMMUNITY): Admission: RE | Payer: Self-pay

## 2024-02-21 ENCOUNTER — Ambulatory Visit (HOSPITAL_COMMUNITY): Admission: RE | Admit: 2024-02-21 | Source: Home / Self Care | Admitting: Pulmonary Disease

## 2024-02-21 DIAGNOSIS — R911 Solitary pulmonary nodule: Secondary | ICD-10-CM | POA: Insufficient documentation

## 2024-02-21 HISTORY — DX: Sleep apnea, unspecified: G47.30

## 2024-02-22 NOTE — Telephone Encounter (Signed)
 Error

## 2024-02-27 ENCOUNTER — Telehealth: Payer: Self-pay

## 2024-02-27 NOTE — Progress Notes (Unsigned)
 "  History of Present Illness Travis Booker is a 74 y.o. male with ***   02/27/2024 Discussed the use of AI scribe software for clinical note transcription with the patient, who gave verbal consent to proceed.  History of Present Illness    Needs cardiology clearance for bronch  Test Results:     Latest Ref Rng & Units 10/30/2023   12:27 PM 09/03/2022    3:13 AM 09/02/2022   10:06 AM  CBC  WBC 3.4 - 10.8 x10E3/uL 6.9  3.4  4.4   Hemoglobin 13.0 - 17.7 g/dL 85.8  89.7  88.6   Hematocrit 37.5 - 51.0 % 42.7  30.4  34.5   Platelets 150 - 450 x10E3/uL 173  118  143        Latest Ref Rng & Units 10/30/2023   12:27 PM 09/03/2022    3:13 AM 09/02/2022   10:06 AM  BMP  Glucose 70 - 99 mg/dL 94  97  91   BUN 8 - 27 mg/dL 22  15  15    Creatinine 0.76 - 1.27 mg/dL 9.10  9.11  9.08   BUN/Creat Ratio 10 - 24 25     Sodium 134 - 144 mmol/L 139  132  135   Potassium 3.5 - 5.2 mmol/L 3.9  3.4  3.3   Chloride 96 - 106 mmol/L 99  98  99   CO2 20 - 29 mmol/L 27  24  23    Calcium  8.6 - 10.2 mg/dL 9.6  8.0  8.5     BNP    Component Value Date/Time   BNP 8.2 06/23/2008 0000    ProBNP    Component Value Date/Time   PROBNP 346.8 (H) 11/14/2011 0858    PFT    Component Value Date/Time   FEV1PRE 2.00 09/18/2014 1357   FVCPRE 3.44 09/18/2014 1357   TLC 7.38 09/18/2014 1357   DLCOUNC 22.02 09/18/2014 1357   PREFEV1FVCRT 58 09/18/2014 1357    DG Chest 2 View Result Date: 02/23/2024 EXAM: 2 VIEW(S) XRAY OF THE CHEST 02/20/2024 02:13:07 PM COMPARISON: 10/24/2023 CLINICAL HISTORY: Cough. FINDINGS: LUNGS AND PLEURA: Hyperinflation. No pleural effusion. No pneumothorax. HEART AND MEDIASTINUM: No acute abnormality of the cardiac and mediastinal silhouettes. BONES AND SOFT TISSUES: Left shoulder arthroplasty noted. Degenerative changes in spine. Surgical changes in lower cervical spine. IMPRESSION: 1. No acute cardiopulmonary abnormality. Electronically signed by: Oneil Devonshire MD  02/23/2024 03:04 PM EST RP Workstation: [id number]   CT SUPER D CHEST WO CONTRAST Result Date: 02/18/2024 EXAM: CT CHEST WITHOUT CONTRAST 02/13/2024 05:38:47 PM TECHNIQUE: CT of the chest was performed without the administration of intravenous contrast. Multiplanar reformatted images are provided for review. Automated exposure control, iterative reconstruction, and/or weight based adjustment of the mA/kV was utilized to reduce the radiation dose to as low as reasonably achievable. COMPARISON: Lung cancer screening CT from 12/16/2023. CLINICAL HISTORY: FINDINGS: MEDIASTINUM: Heart: LAD coronary artery calcification. Pericardium is unremarkable. The central airways are clear. Esophagus: Patulous esophagus. Similar mild wall thickening involving the mid and distal esophagus. Vessels: Aortic atherosclerosis. LYMPH NODES: No mediastinal, hilar or axillary lymphadenopathy. LUNGS AND PLEURA: Emphysema. No pleural fluid, airspace consolidation, or pneumothorax. Pulmonary nodule in the anterior left lower lobe measures 1.7 cm. Image 115/4. On the previous exam, this measured 1.2 cm. This is now abutts the oblique fissure, image 115/4. Linear scar-like opacity noted within the right middle lobe. A 2 mm nodule in the lateral right upper lobe is stable. Image 23/4. Is  unchanged, image 55/4. SOFT TISSUES/BONES: Thoracolumbar spondylosis. Postoperative changes noted within the lower cervical spine. No acute abnormality of the soft tissues. UPPER ABDOMEN: No acute abnormality within the imaged portions of the upper abdomen. Status post cholecystectomy. IMPRESSION: 1. Enlarging 1.7 cm solid pulmonary nodule in the anterior left lower lobe along the oblique fissure, previously 1.2 cm, most concerning for primary lung malignancy. 2. Additional small pulmonary nodules are stable in the interval. 3. Aortic atherosclerosis and emphysema. Electronically signed by: Waddell Calk MD 02/18/2024 05:50 AM EST RP Workstation: [id number]      Past medical hx Past Medical History:  Diagnosis Date   Anxiety    Cervical disc disease    surgery planned for 05/25/10 (Dr. Carles)   COPD (chronic obstructive pulmonary disease) (HCC)    Depression with anxiety    DJD (degenerative joint disease)    GERD (gastroesophageal reflux disease)    HTN (hypertension)    Paroxysmal atrial fibrillation (HCC)    a. flecainide  therapy;  b. echo 6/09: EF normal, mild LVH;   c. Myoview  7/09: EF 59%, no ischemia   Sleep apnea      Social History[1]  Mr.Vandewater reports that he quit smoking about 2 years ago. His smoking use included cigarettes. He started smoking about 59 years ago. He has a 28.5 pack-year smoking history. He has never used smokeless tobacco. He reports that he does not currently use alcohol. He reports that he does not use drugs.  Tobacco Cessation: Counseling given: Not Answered Tobacco comments: Quit couple months ago. 02/06/2024   Past surgical hx, Family hx, Social hx all reviewed.  Current Outpatient Medications on File Prior to Visit  Medication Sig   acetaminophen  (TYLENOL ) 325 MG tablet Take 2 tablets (650 mg total) by mouth every 6 (six) hours as needed for mild pain, fever or headache (or Fever >/= 101).   albuterol  (VENTOLIN  HFA) 108 (90 Base) MCG/ACT inhaler Inhale 1-2 puffs into the lungs every 6 (six) hours as needed for wheezing or shortness of breath.   apixaban  (ELIQUIS ) 5 MG TABS tablet Take 1 tablet (5 mg total) by mouth 2 (two) times daily.   chlorthalidone  (HYGROTON ) 25 MG tablet Take 12.5 mg by mouth daily.   diltiazem  (CARDIZEM  CD) 120 MG 24 hr capsule Take 1 capsule (120 mg total) by mouth daily.   doxycycline  (VIBRA -TABS) 100 MG tablet Take 1 tablet (100 mg total) by mouth 2 (two) times daily.   escitalopram  (LEXAPRO ) 10 MG tablet Take 10 mg by mouth daily.   flecainide  (TAMBOCOR ) 100 MG tablet Take 1 tablet (100 mg total) by mouth 2 (two) times daily.   metoprolol  tartrate (LOPRESSOR ) 25 MG  tablet Take 12.5 mg by mouth daily.   omeprazole (PRILOSEC) 40 MG capsule Take 40 mg by mouth daily.   oxyCODONE -acetaminophen  (PERCOCET) 10-325 MG tablet Take 1 tablet by mouth every 4 (four) hours as needed for moderate pain. pain   predniSONE  (DELTASONE ) 10 MG tablet Take 40mg  daily for 3 days, then 30mg  daily for 3 days, then 20mg  daily for 3 days, then 10mg  daily for 3 days, then stop   rosuvastatin  (CRESTOR ) 20 MG tablet Take 20 mg by mouth at bedtime.   Tiotropium Bromide (SPIRIVA  RESPIMAT) 2.5 MCG/ACT AERS Inhale 2 puffs into the lungs daily.   No current facility-administered medications on file prior to visit.     Allergies[2]  Review Of Systems:  Constitutional:   No  weight loss, night sweats,  Fevers, chills, fatigue, or  lassitude.  HEENT:   No headaches,  Difficulty swallowing,  Tooth/dental problems, or  Sore throat,                No sneezing, itching, ear ache, nasal congestion, post nasal drip,   CV:  No chest pain,  Orthopnea, PND, swelling in lower extremities, anasarca, dizziness, palpitations, syncope.   GI  No heartburn, indigestion, abdominal pain, nausea, vomiting, diarrhea, change in bowel habits, loss of appetite, bloody stools.   Resp: No shortness of breath with exertion or at rest.  No excess mucus, no productive cough,  No non-productive cough,  No coughing up of blood.  No change in color of mucus.  No wheezing.  No chest wall deformity  Skin: no rash or lesions.  GU: no dysuria, change in color of urine, no urgency or frequency.  No flank pain, no hematuria   MS:  No joint pain or swelling.  No decreased range of motion.  No back pain.  Psych:  No change in mood or affect. No depression or anxiety.  No memory loss.   Vital Signs There were no vitals taken for this visit.   Physical Exam:  General- No distress,  A&Ox3 ENT: No sinus tenderness, TM clear, pale nasal mucosa, no oral exudate,no post nasal drip, no LAN Cardiac: S1, S2, regular  rate and rhythm, no murmur Chest: No wheeze/ rales/ dullness; no accessory muscle use, no nasal flaring, no sternal retractions Abd.: Soft Non-tender Ext: No clubbing cyanosis, edema Neuro:  normal strength Skin: No rashes, warm and dry Psych: normal mood and behavior  Physical Exam    Assessment/Plan  Assessment and Plan Assessment & Plan        Lauraine JULIANNA Lites, NP 02/27/2024  3:41 PM             [1]  Social History Tobacco Use   Smoking status: Former    Current packs/day: 0.00    Average packs/day: 0.5 packs/day for 57.0 years (28.5 ttl pk-yrs)    Types: Cigarettes    Start date: 08/03/1964    Quit date: 08/03/2021    Years since quitting: 2.5   Smokeless tobacco: Never   Tobacco comments:    Quit couple months ago. 02/06/2024  Vaping Use   Vaping status: Never Used  Substance Use Topics   Alcohol use: Not Currently    Comment: stopped drinking 1 year  sgo as of 08/2021.   Drug use: No  [2] No Known Allergies  "

## 2024-02-27 NOTE — Telephone Encounter (Signed)
-----   Message from Lauraine Lites, NP sent at 02/27/2024  3:39 PM EST ----- Regarding: RE: Navigation bronch pending I am seeing him 02/29/2024. My Nurse Wilford will make the cardiology request for clearance. Thanks for the heads up. ----- Message ----- From: Elias Isaiah ORN, PA-C Sent: 02/22/2024   4:55 PM EST To: Lauraine JULIANNA Lites, NP; Lamar GORMAN Chris, MD Subject: Navigation bronch pending                      Hi Dr. Chris, You saw Mr. Warshaw this week and navigational bronchoscopy was postponed due to COPD exacerbation. He is not yet back on the OR schedule, but today I took a few minutes to review his history since I imagine he will be re-posted sooner than later.  He is followed by cardiologist Dr. Dorn Ross for PAF, chest pain, DOE. He hasn't been seen since 03/06/2022. He had a stress test in 01/25/2022 for chest pain evaluation--which was overall low risk but inferior scar with mild peri-infarct ischemia could not be ruled out. He was still having some chest symptoms at his follow-up, although possibly related to GI.  NP had asked him to follow-up in 2-3 months. (He was given Eliquis  instructions for EGD in October, but has otherwise not been evaluated by cardiology in about 2 years.)  If his case is going to be rescheduled weeks out, could you or Lauraine reach out to [facility] for preoperative risk assessment? If time will not allow/case too urgent then I can always update one of our anesthesiologists. Just let me know what you think.  Thanks, Isaiah Elias, PA-C Surgical Short Stay/[facility] Phone [phone] 02/22/2024 4:54 PM

## 2024-02-27 NOTE — Telephone Encounter (Signed)
 Will call for clearance once I have all information such as date and time as well as the doc that is doing it

## 2024-02-29 ENCOUNTER — Encounter: Payer: Self-pay | Admitting: Acute Care

## 2024-02-29 ENCOUNTER — Ambulatory Visit: Admitting: Acute Care

## 2024-02-29 ENCOUNTER — Telehealth: Payer: Self-pay | Admitting: Cardiology

## 2024-02-29 ENCOUNTER — Telehealth: Payer: Self-pay | Admitting: Acute Care

## 2024-02-29 ENCOUNTER — Encounter: Payer: Self-pay | Admitting: Emergency Medicine

## 2024-02-29 VITALS — BP 138/68 | HR 57 | Temp 98.1°F | Ht 72.0 in | Wt 182.0 lb

## 2024-02-29 DIAGNOSIS — R911 Solitary pulmonary nodule: Secondary | ICD-10-CM | POA: Insufficient documentation

## 2024-02-29 DIAGNOSIS — Z87891 Personal history of nicotine dependence: Secondary | ICD-10-CM

## 2024-02-29 DIAGNOSIS — R9389 Abnormal findings on diagnostic imaging of other specified body structures: Secondary | ICD-10-CM

## 2024-02-29 DIAGNOSIS — J449 Chronic obstructive pulmonary disease, unspecified: Secondary | ICD-10-CM

## 2024-02-29 DIAGNOSIS — J441 Chronic obstructive pulmonary disease with (acute) exacerbation: Secondary | ICD-10-CM

## 2024-02-29 MED ORDER — AZITHROMYCIN 250 MG PO TABS: ORAL_TABLET | ORAL | 0 refills | Status: AC

## 2024-02-29 NOTE — Telephone Encounter (Signed)
 Provider performing procedure: Byrum Diagnosis: Left lower lobe nodule Which side if for nodule / mass?  Left Procedure: Robotic assisted navigational bronchoscopy Has patient been spoken to by Provider and given informed consent?  Yes Anesthesia: General Do you need Fluro?  Yes Duration of procedure: 45 minutes Date: 03/17/2024 Alternate Date:   Time: Fourth case of the day please  Location: [facility] endoscopy Does patient have OSA?  No DM?  No or Latex allergy?  No Medication Restriction/ Anticoagulate/Antiplatelet: Stop Eliquis  2 days prior. ( Last dose 02/12/2024)  Pre-op Labs Ordered:determined by Anesthesia Imaging request: Super D CT chest will be ordered on 02/06/2024 at [facility] (If, SuperDimension CT Chest, please have STAT courier sent to ENDO)

## 2024-02-29 NOTE — Telephone Encounter (Signed)
"  ° °  Pre-operative Risk Assessment    Patient Name: Travis Booker  DOB: [birth year] MRN: [id number]   Date of last office visit: 03/06/22 Date of next office visit: Unknown   Request for Surgical Clearance    Procedure:  Bronchoscopy of Biopsy   Date of Surgery:  Clearance 03/17/24                                Surgeon:  Dr. Shelah Surgeon's Group or Practice Name:  [facility]  Phone number:  [phone] Fax number:  [phone]   Type of Clearance Requested:   - Pharmacy:  Hold Apixaban  (Eliquis )     Type of Anesthesia:  General    Additional requests/questions:    [name]   02/29/2024, 10:47 AM   "

## 2024-02-29 NOTE — Telephone Encounter (Signed)
 Letter given by the nurse Case [phone] will send to [facility] for auth

## 2024-02-29 NOTE — Patient Instructions (Addendum)
 It is good to see you today. I am glad you are feeling better. Out of the utmost caution, I will treat your with a z pack, as I do not want to have to cancel your biopsy again. Please call us  if you are not completely better before the biopsy. I have sent in a z pack, which is an antibiotic. Take 2 tablets ( 500 mg total) Day 1 Take 1 tablet ( 250 mg)  Day 2 through 5 . I have placed an order for a bronchoscopy with biopsies.  We will do this 03/17/2024 We have discussed the procedure in detail.  We have reviewed the risks and benefits of the procedure. These include bleeding, infection, puncture of the lung, and adverse reaction to anesthesia. You have agreed to proceed with biopsy to evaluate the left lower lobe nodule. Your procedure will be done by Dr. Lamar Chris. You will receive a letter today with date time and information pertaining to the procedure. You will need someone to drive you to the procedure, stay with you during the procedure, and stay with you after the procedure. Please stop your Eliquis  03/14/2024 after your last dose.  This needs to be held x 48 hours before the procedure. We will request cardiac clearance for this procedure today. You will also need someone to stay with you for 24 hours after anesthesia to ensure you have cleared and are doing well. You will follow-up with me 1 week after the procedure to review the results and to ensure you are doing well. Call if you need us  prior to the procedure or if you have any questions at all. Please contact office for sooner follow up if symptoms do not improve or worsen or seek emergency care

## 2024-02-29 NOTE — Telephone Encounter (Signed)
 Pharmacy please advise on holding Apixaban  prior to bronchoscopy  and biopsy scheduled for 03/17/2024. Thank you.

## 2024-02-29 NOTE — Telephone Encounter (Signed)
 Patient with diagnosis of atrial fibrillation on Eliquis  for anticoagulation.    Procedure:  Bronchoscopy of Biopsy    Date of Surgery:  Clearance 03/17/24    CHA2DS2-VASc Score = 3   This indicates a 3.2% annual risk of stroke. The patient's score is based upon: CHF History: 0 HTN History: 1 Diabetes History: 0 Stroke History: 0 Vascular Disease History: 1 Age Score: 1 Gender Score: 0   CrCl 88 Platelet count 173  Patient has not had an Afib/aflutter ablation in the last 3 months, DCCV within the last 4 weeks or a watchman implanted in the last 45 days   Per office protocol, patient can hold Eliquis  for 2 days prior to procedure.   Patient will not need bridging with Lovenox  (enoxaparin ) around procedure.  **This guidance is not considered finalized until pre-operative APP has relayed final recommendations.**

## 2024-03-17 ENCOUNTER — Encounter (HOSPITAL_COMMUNITY): Payer: Self-pay

## 2024-03-17 ENCOUNTER — Ambulatory Visit (HOSPITAL_COMMUNITY): Admit: 2024-03-17 | Admitting: Emergency Medicine

## 2024-03-17 DIAGNOSIS — R911 Solitary pulmonary nodule: Secondary | ICD-10-CM | POA: Insufficient documentation

## 2024-03-24 ENCOUNTER — Ambulatory Visit: Admitting: Acute Care
